# Patient Record
Sex: Male | Born: 1955
Health system: Southern US, Community
[De-identification: ages and names within clinical notes are randomized; demographics above are authoritative.]

## PROBLEM LIST (undated history)

## (undated) DIAGNOSIS — H269 Unspecified cataract: Secondary | ICD-10-CM

## (undated) DIAGNOSIS — E1159 Type 2 diabetes mellitus with other circulatory complications: Secondary | ICD-10-CM

## (undated) DIAGNOSIS — R200 Anesthesia of skin: Secondary | ICD-10-CM

## (undated) DIAGNOSIS — G4733 Obstructive sleep apnea (adult) (pediatric): Secondary | ICD-10-CM

## (undated) DIAGNOSIS — F419 Anxiety disorder, unspecified: Secondary | ICD-10-CM

## (undated) DIAGNOSIS — M1990 Unspecified osteoarthritis, unspecified site: Secondary | ICD-10-CM

## (undated) DIAGNOSIS — K4090 Unilateral inguinal hernia, without obstruction or gangrene, not specified as recurrent: Secondary | ICD-10-CM

## (undated) DIAGNOSIS — R7989 Other specified abnormal findings of blood chemistry: Secondary | ICD-10-CM

## (undated) DIAGNOSIS — M51369 Other intervertebral disc degeneration, lumbar region without mention of lumbar back pain or lower extremity pain: Secondary | ICD-10-CM

## (undated) DIAGNOSIS — D170 Benign lipomatous neoplasm of skin and subcutaneous tissue of head, face and neck: Secondary | ICD-10-CM

## (undated) DIAGNOSIS — M48061 Spinal stenosis, lumbar region without neurogenic claudication: Secondary | ICD-10-CM

## (undated) DIAGNOSIS — R740 Nonspecific elevation of levels of transaminase and lactic acid dehydrogenase [LDH]: Secondary | ICD-10-CM

## (undated) DIAGNOSIS — M545 Low back pain, unspecified: Secondary | ICD-10-CM

## (undated) DIAGNOSIS — E1165 Type 2 diabetes mellitus with hyperglycemia: Secondary | ICD-10-CM

## (undated) DIAGNOSIS — G4700 Insomnia, unspecified: Secondary | ICD-10-CM

## (undated) DIAGNOSIS — M5416 Radiculopathy, lumbar region: Secondary | ICD-10-CM

## (undated) DIAGNOSIS — S322XXA Fracture of coccyx, initial encounter for closed fracture: Secondary | ICD-10-CM

## (undated) DIAGNOSIS — G8929 Other chronic pain: Secondary | ICD-10-CM

## (undated) DIAGNOSIS — M5136 Other intervertebral disc degeneration, lumbar region: Secondary | ICD-10-CM

## (undated) DIAGNOSIS — E663 Overweight: Secondary | ICD-10-CM

## (undated) DIAGNOSIS — R197 Diarrhea, unspecified: Secondary | ICD-10-CM

## (undated) DIAGNOSIS — I4892 Unspecified atrial flutter: Secondary | ICD-10-CM

## (undated) DIAGNOSIS — Z860100 Personal history of colon polyps, unspecified: Secondary | ICD-10-CM

## (undated) DIAGNOSIS — K5730 Diverticulosis of large intestine without perforation or abscess without bleeding: Secondary | ICD-10-CM

## (undated) DIAGNOSIS — I10 Essential (primary) hypertension: Secondary | ICD-10-CM

## (undated) DIAGNOSIS — E785 Hyperlipidemia, unspecified: Secondary | ICD-10-CM

## (undated) DIAGNOSIS — N390 Urinary tract infection, site not specified: Secondary | ICD-10-CM

## (undated) DIAGNOSIS — C61 Malignant neoplasm of prostate: Secondary | ICD-10-CM

## (undated) DIAGNOSIS — M75100 Unspecified rotator cuff tear or rupture of unspecified shoulder, not specified as traumatic: Secondary | ICD-10-CM

## (undated) DIAGNOSIS — I4891 Unspecified atrial fibrillation: Secondary | ICD-10-CM

## (undated) DIAGNOSIS — K219 Gastro-esophageal reflux disease without esophagitis: Secondary | ICD-10-CM

## (undated) DIAGNOSIS — I441 Atrioventricular block, second degree: Secondary | ICD-10-CM

## (undated) DIAGNOSIS — M4800 Spinal stenosis, site unspecified: Secondary | ICD-10-CM

## (undated) DIAGNOSIS — Z8249 Family history of ischemic heart disease and other diseases of the circulatory system: Secondary | ICD-10-CM

## (undated) DIAGNOSIS — Z8719 Personal history of other diseases of the digestive system: Secondary | ICD-10-CM

## (undated) DIAGNOSIS — I493 Ventricular premature depolarization: Secondary | ICD-10-CM

## (undated) DIAGNOSIS — I491 Atrial premature depolarization: Secondary | ICD-10-CM

## (undated) DIAGNOSIS — K760 Fatty (change of) liver, not elsewhere classified: Secondary | ICD-10-CM

## (undated) DIAGNOSIS — R000 Tachycardia, unspecified: Secondary | ICD-10-CM

## (undated) DIAGNOSIS — J069 Acute upper respiratory infection, unspecified: Secondary | ICD-10-CM

## (undated) DIAGNOSIS — G609 Hereditary and idiopathic neuropathy, unspecified: Secondary | ICD-10-CM

## (undated) DIAGNOSIS — M722 Plantar fascial fibromatosis: Secondary | ICD-10-CM

## (undated) DIAGNOSIS — M5126 Other intervertebral disc displacement, lumbar region: Secondary | ICD-10-CM

## (undated) HISTORY — DX: Ventricular premature depolarization: I49.3

## (undated) HISTORY — DX: Unspecified atrial fibrillation: I48.91

## (undated) HISTORY — DX: Essential (primary) hypertension: I10

## (undated) HISTORY — DX: Type 2 diabetes mellitus with hyperglycemia: E11.65

## (undated) HISTORY — DX: Insomnia, unspecified: G47.00

## (undated) HISTORY — DX: Nonspecific elevation of levels of transaminase and lactic acid dehydrogenase (ldh): R74.0

## (undated) HISTORY — DX: Urinary tract infection, site not specified: N39.0

## (undated) HISTORY — DX: Acute upper respiratory infection, unspecified: J06.9

## (undated) HISTORY — DX: Low back pain: M54.5

## (undated) HISTORY — DX: Tachycardia, unspecified: R00.0

## (undated) HISTORY — PX: OTHER SURGICAL HISTORY: SHX169

## (undated) HISTORY — DX: Low back pain, unspecified: M54.50

## (undated) HISTORY — DX: Other specified abnormal findings of blood chemistry: R79.89

## (undated) HISTORY — DX: Malignant neoplasm of prostate: C61

## (undated) HISTORY — DX: Hyperlipidemia, unspecified: E78.5

## (undated) HISTORY — DX: Overweight: E66.3

## (undated) HISTORY — DX: Plantar fascial fibromatosis: M72.2

## (undated) HISTORY — DX: Family history of ischemic heart disease and other diseases of the circulatory system: Z82.49

## (undated) HISTORY — DX: Atrioventricular block, second degree: I44.1

## (undated) HISTORY — DX: Anxiety disorder, unspecified: F41.9

## (undated) HISTORY — DX: Hereditary and idiopathic neuropathy, unspecified: G60.9

## (undated) HISTORY — DX: Type 2 diabetes mellitus with other circulatory complications: E11.59

## (undated) HISTORY — DX: Unspecified atrial flutter: I48.92

## (undated) HISTORY — PX: NASAL SINUS SURGERY: SHX719

## (undated) HISTORY — DX: Other chronic pain: G89.29

## (undated) HISTORY — DX: Atrial premature depolarization: I49.1

## (undated) HISTORY — PX: PROSTATE BIOPSY: SHX241

## (undated) HISTORY — DX: Obstructive sleep apnea (adult) (pediatric): G47.33

## (undated) HISTORY — PX: ABLATION: SHX5711

---
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
## 2000-01-29 ENCOUNTER — Other Ambulatory Visit: Admission: RE | Admit: 2000-01-29 | Discharge: 2000-01-29 | Payer: Self-pay | Admitting: Oral Surgery

## 2003-02-27 ENCOUNTER — Encounter: Payer: Self-pay | Admitting: Internal Medicine

## 2003-03-06 ENCOUNTER — Encounter: Payer: Self-pay | Admitting: Internal Medicine

## 2003-07-20 DIAGNOSIS — D170 Benign lipomatous neoplasm of skin and subcutaneous tissue of head, face and neck: Secondary | ICD-10-CM

## 2003-07-20 HISTORY — DX: Benign lipomatous neoplasm of skin and subcutaneous tissue of head, face and neck: D17.0

## 2003-08-17 ENCOUNTER — Encounter: Admission: RE | Admit: 2003-08-17 | Discharge: 2003-08-17 | Payer: Self-pay | Admitting: Internal Medicine

## 2004-04-15 ENCOUNTER — Ambulatory Visit: Payer: Self-pay | Admitting: Internal Medicine

## 2004-04-22 ENCOUNTER — Ambulatory Visit: Payer: Self-pay | Admitting: Internal Medicine

## 2004-05-06 ENCOUNTER — Ambulatory Visit: Payer: Self-pay | Admitting: Internal Medicine

## 2004-06-05 ENCOUNTER — Ambulatory Visit: Payer: Self-pay | Admitting: Internal Medicine

## 2004-09-05 ENCOUNTER — Ambulatory Visit: Payer: Self-pay | Admitting: Internal Medicine

## 2004-11-22 ENCOUNTER — Ambulatory Visit: Payer: Self-pay | Admitting: Internal Medicine

## 2005-02-02 ENCOUNTER — Ambulatory Visit: Payer: Self-pay | Admitting: Internal Medicine

## 2005-04-30 ENCOUNTER — Ambulatory Visit: Payer: Self-pay | Admitting: Internal Medicine

## 2005-11-19 ENCOUNTER — Ambulatory Visit: Payer: Self-pay | Admitting: Internal Medicine

## 2005-11-19 LAB — CONVERTED CEMR LAB
ALT: 57 units/L — ABNORMAL HIGH (ref 0–40)
AST: 33 units/L (ref 0–37)
Albumin: 4.3 g/dL (ref 3.5–5.2)
Alkaline Phosphatase: 61 units/L (ref 39–117)
BUN: 14 mg/dL (ref 6–23)
Basophils Absolute: 0 10*3/uL (ref 0.0–0.1)
Basophils Relative: 0.9 % (ref 0.0–1.0)
CO2: 30 meq/L (ref 19–32)
Calcium: 9.3 mg/dL (ref 8.4–10.5)
Chloride: 104 meq/L (ref 96–112)
Chol/HDL Ratio, serum: 4.5
Cholesterol: 138 mg/dL (ref 0–200)
Creatinine, Ser: 1.1 mg/dL (ref 0.4–1.5)
Eosinophil percent: 1.9 % (ref 0.0–5.0)
GFR calc non Af Amer: 76 mL/min
Glomerular Filtration Rate, Af Am: 92 mL/min/{1.73_m2}
Glucose, Bld: 123 mg/dL — ABNORMAL HIGH (ref 70–99)
HCT: 45.5 % (ref 39.0–52.0)
HDL: 31 mg/dL — ABNORMAL LOW (ref >39.0)
Hemoglobin: 15.2 g/dL (ref 13.0–17.0)
Hgb A1c MFr Bld: 5.7 % (ref 4.6–6.0)
LDL Cholesterol: 89 mg/dL (ref 0–99)
Lymphocytes Relative: 31.6 % (ref 12.0–46.0)
MCHC: 33.5 g/dL (ref 30.0–36.0)
MCV: 94.4 fL (ref 78.0–100.0)
Monocytes Absolute: 0.7 10*3/uL (ref 0.2–0.7)
Monocytes Relative: 13.5 % — ABNORMAL HIGH (ref 3.0–11.0)
Neutro Abs: 2.9 10*3/uL (ref 1.4–7.7)
Neutrophils Relative %: 52.1 % (ref 43.0–77.0)
Platelets: 174 10*3/uL (ref 150–400)
Potassium: 4.4 meq/L (ref 3.5–5.1)
RBC: 4.82 M/uL (ref 4.22–5.81)
RDW: 11.8 % (ref 11.5–14.6)
Sodium: 141 meq/L (ref 135–145)
TSH: 1.59 microintl units/mL (ref 0.35–5.50)
Total Bilirubin: 0.6 mg/dL (ref 0.3–1.2)
Total Protein: 6.7 g/dL (ref 6.0–8.3)
Triglyceride fasting, serum: 88 mg/dL (ref 0–149)
VLDL: 18 mg/dL (ref 0–40)
WBC: 5.4 10*3/uL (ref 4.5–10.5)

## 2005-11-25 ENCOUNTER — Ambulatory Visit: Payer: Self-pay | Admitting: Internal Medicine

## 2005-11-29 DIAGNOSIS — E785 Hyperlipidemia, unspecified: Secondary | ICD-10-CM | POA: Insufficient documentation

## 2005-11-30 ENCOUNTER — Ambulatory Visit: Payer: Self-pay | Admitting: Internal Medicine

## 2005-11-30 DIAGNOSIS — R740 Nonspecific elevation of levels of transaminase and lactic acid dehydrogenase [LDH]: Secondary | ICD-10-CM

## 2005-11-30 DIAGNOSIS — R7401 Elevation of levels of liver transaminase levels: Secondary | ICD-10-CM

## 2005-11-30 HISTORY — DX: Elevation of levels of liver transaminase levels: R74.01

## 2006-05-24 ENCOUNTER — Ambulatory Visit: Payer: Self-pay | Admitting: Internal Medicine

## 2006-05-24 LAB — CONVERTED CEMR LAB
ALT: 59 units/L — ABNORMAL HIGH (ref 0–40)
AST: 34 units/L (ref 0–37)
Albumin: 4 g/dL (ref 3.5–5.2)
Alkaline Phosphatase: 51 units/L (ref 39–117)
BUN: 13 mg/dL (ref 6–23)
Bilirubin, Direct: 0.1 mg/dL (ref 0.0–0.3)
CO2: 28 meq/L (ref 19–32)
Calcium: 9.2 mg/dL (ref 8.4–10.5)
Chloride: 108 meq/L (ref 96–112)
Cholesterol: 166 mg/dL (ref 0–200)
Creatinine, Ser: 1 mg/dL (ref 0.4–1.5)
GFR calc Af Amer: 102 mL/min
GFR calc non Af Amer: 84 mL/min
Glucose, Bld: 119 mg/dL — ABNORMAL HIGH (ref 70–99)
HDL: 36.1 mg/dL — ABNORMAL LOW (ref >39.0)
Hgb A1c MFr Bld: 6 % (ref 4.6–6.0)
LDL Cholesterol: 108 mg/dL — ABNORMAL HIGH (ref 0–99)
Potassium: 4 meq/L (ref 3.5–5.1)
Sodium: 142 meq/L (ref 135–145)
Total Bilirubin: 0.7 mg/dL (ref 0.3–1.2)
Total CHOL/HDL Ratio: 4.6
Total Protein: 6.2 g/dL (ref 6.0–8.3)
Triglycerides: 111 mg/dL (ref 0–149)
VLDL: 22 mg/dL (ref 0–40)

## 2006-05-31 ENCOUNTER — Ambulatory Visit: Payer: Self-pay | Admitting: Internal Medicine

## 2006-06-01 LAB — CONVERTED CEMR LAB
ALT: 63 units/L — ABNORMAL HIGH (ref 0–40)
AST: 32 units/L (ref 0–37)
Albumin: 4.4 g/dL (ref 3.5–5.2)
Alkaline Phosphatase: 49 units/L (ref 39–117)
BUN: 10 mg/dL (ref 6–23)
Basophils Absolute: 0 10*3/uL (ref 0.0–0.1)
Basophils Relative: 0.7 % (ref 0.0–1.0)
Bilirubin, Direct: 0.1 mg/dL (ref 0.0–0.3)
CO2: 25 meq/L (ref 19–32)
Calcium: 9 mg/dL (ref 8.4–10.5)
Chloride: 107 meq/L (ref 96–112)
Cholesterol: 153 mg/dL (ref 0–200)
Creatinine, Ser: 0.9 mg/dL (ref 0.4–1.5)
Eosinophils Absolute: 0.1 10*3/uL (ref 0.0–0.6)
Eosinophils Relative: 1.4 % (ref 0.0–5.0)
GFR calc Af Amer: 115 mL/min
GFR calc non Af Amer: 95 mL/min
Glucose, Bld: 84 mg/dL (ref 70–99)
HCT: 43.4 % (ref 39.0–52.0)
HDL: 29.3 mg/dL — ABNORMAL LOW (ref >39.0)
Hemoglobin: 15.2 g/dL (ref 13.0–17.0)
LDL Cholesterol: 93 mg/dL (ref 0–99)
Lymphocytes Relative: 32.5 % (ref 12.0–46.0)
MCHC: 35 g/dL (ref 30.0–36.0)
MCV: 92.5 fL (ref 78.0–100.0)
Monocytes Absolute: 0.8 10*3/uL — ABNORMAL HIGH (ref 0.2–0.7)
Monocytes Relative: 14.6 % — ABNORMAL HIGH (ref 3.0–11.0)
Neutro Abs: 2.9 10*3/uL (ref 1.4–7.7)
Neutrophils Relative %: 50.8 % (ref 43.0–77.0)
PSA: 2.41 ng/mL (ref 0.10–4.00)
Platelets: 152 10*3/uL (ref 150–400)
Potassium: 4 meq/L (ref 3.5–5.1)
RBC: 4.7 M/uL (ref 4.22–5.81)
RDW: 11.9 % (ref 11.5–14.6)
Sodium: 139 meq/L (ref 135–145)
TSH: 1.95 microintl units/mL (ref 0.35–5.50)
Total Bilirubin: 0.7 mg/dL (ref 0.3–1.2)
Total CHOL/HDL Ratio: 5.2
Total Protein: 6.4 g/dL (ref 6.0–8.3)
Triglycerides: 153 mg/dL — ABNORMAL HIGH (ref 0–149)
VLDL: 31 mg/dL (ref 0–40)
WBC: 5.7 10*3/uL (ref 4.5–10.5)

## 2006-09-30 ENCOUNTER — Telehealth: Payer: Self-pay | Admitting: *Deleted

## 2006-11-29 ENCOUNTER — Ambulatory Visit: Payer: Self-pay | Admitting: Internal Medicine

## 2006-11-29 LAB — CONVERTED CEMR LAB
ALT: 43 units/L (ref 0–53)
AST: 27 units/L (ref 0–37)
Albumin: 4.2 g/dL (ref 3.5–5.2)
Alkaline Phosphatase: 60 units/L (ref 39–117)
BUN: 10 mg/dL (ref 6–23)
Basophils Absolute: 0 10*3/uL (ref 0.0–0.1)
Basophils Relative: 0.7 % (ref 0.0–1.0)
Bilirubin Urine: NEGATIVE
Bilirubin, Direct: 0.1 mg/dL (ref 0.0–0.3)
CO2: 31 meq/L (ref 19–32)
Calcium: 9.4 mg/dL (ref 8.4–10.5)
Chloride: 107 meq/L (ref 96–112)
Cholesterol: 172 mg/dL (ref 0–200)
Creatinine, Ser: 0.9 mg/dL (ref 0.4–1.5)
Eosinophils Absolute: 0.1 10*3/uL (ref 0.0–0.6)
Eosinophils Relative: 2.3 % (ref 0.0–5.0)
GFR calc Af Amer: 115 mL/min
GFR calc non Af Amer: 95 mL/min
Glucose, Bld: 118 mg/dL — ABNORMAL HIGH (ref 70–99)
Glucose, Urine, Semiquant: 100
HCT: 45.3 % (ref 39.0–52.0)
HDL: 33.7 mg/dL — ABNORMAL LOW (ref >39.0)
Hemoglobin: 15.7 g/dL (ref 13.0–17.0)
Ketones, urine, test strip: NEGATIVE
LDL Cholesterol: 112 mg/dL — ABNORMAL HIGH (ref 0–99)
Lymphocytes Relative: 29.6 % (ref 12.0–46.0)
MCHC: 34.6 g/dL (ref 30.0–36.0)
MCV: 93.9 fL (ref 78.0–100.0)
Monocytes Absolute: 0.7 10*3/uL (ref 0.2–0.7)
Monocytes Relative: 12.4 % — ABNORMAL HIGH (ref 3.0–11.0)
Neutro Abs: 3 10*3/uL (ref 1.4–7.7)
Neutrophils Relative %: 55 % (ref 43.0–77.0)
Nitrite: NEGATIVE
PSA: 1.81 ng/mL (ref 0.10–4.00)
Platelets: 158 10*3/uL (ref 150–400)
Potassium: 4.8 meq/L (ref 3.5–5.1)
Protein, U semiquant: NEGATIVE
RBC: 4.82 M/uL (ref 4.22–5.81)
RDW: 12.2 % (ref 11.5–14.6)
Sodium: 143 meq/L (ref 135–145)
Specific Gravity, Urine: <1.005
TSH: 1.43 microintl units/mL (ref 0.35–5.50)
Total Bilirubin: 0.7 mg/dL (ref 0.3–1.2)
Total CHOL/HDL Ratio: 5.1
Total Protein: 6.2 g/dL (ref 6.0–8.3)
Triglycerides: 134 mg/dL (ref 0–149)
Urobilinogen, UA: 0.2
VLDL: 27 mg/dL (ref 0–40)
WBC Urine, dipstick: NEGATIVE
WBC: 5.4 10*3/uL (ref 4.5–10.5)
pH: 5

## 2006-12-06 ENCOUNTER — Ambulatory Visit: Payer: Self-pay | Admitting: Internal Medicine

## 2006-12-06 DIAGNOSIS — G4700 Insomnia, unspecified: Secondary | ICD-10-CM

## 2006-12-06 DIAGNOSIS — G609 Hereditary and idiopathic neuropathy, unspecified: Secondary | ICD-10-CM

## 2006-12-06 HISTORY — DX: Insomnia, unspecified: G47.00

## 2006-12-06 HISTORY — DX: Hereditary and idiopathic neuropathy, unspecified: G60.9

## 2006-12-07 LAB — CONVERTED CEMR LAB
CRP, High Sensitivity: 1 (ref 0.00–5.00)
Testosterone: 502.32 ng/dL (ref 350.00–890)

## 2007-01-03 ENCOUNTER — Ambulatory Visit: Payer: Self-pay | Admitting: Internal Medicine

## 2007-01-05 ENCOUNTER — Ambulatory Visit: Payer: Self-pay | Admitting: Internal Medicine

## 2007-09-05 ENCOUNTER — Ambulatory Visit: Payer: Self-pay | Admitting: Internal Medicine

## 2007-09-07 ENCOUNTER — Telehealth (INDEPENDENT_AMBULATORY_CARE_PROVIDER_SITE_OTHER): Payer: Self-pay | Admitting: *Deleted

## 2007-09-21 ENCOUNTER — Ambulatory Visit: Payer: Self-pay | Admitting: Gastroenterology

## 2007-10-04 ENCOUNTER — Ambulatory Visit: Payer: Self-pay | Admitting: Internal Medicine

## 2007-10-04 ENCOUNTER — Encounter: Payer: Self-pay | Admitting: Internal Medicine

## 2007-10-04 LAB — HM COLONOSCOPY

## 2007-10-06 ENCOUNTER — Encounter: Payer: Self-pay | Admitting: Internal Medicine

## 2007-10-06 ENCOUNTER — Ambulatory Visit: Payer: Self-pay | Admitting: Internal Medicine

## 2007-10-06 LAB — CONVERTED CEMR LAB
ALT: 60 units/L — ABNORMAL HIGH (ref 0–53)
Albumin: 4.5 g/dL (ref 3.5–5.2)
Basophils Relative: 0.6 % (ref 0.0–3.0)
Bilirubin Urine: NEGATIVE
Bilirubin, Direct: 0.2 mg/dL (ref 0.0–0.3)
Cholesterol: 175 mg/dL (ref 0–200)
Creatinine, Ser: 1 mg/dL (ref 0.4–1.5)
Glucose, Bld: 114 mg/dL — ABNORMAL HIGH (ref 70–99)
Hemoglobin: 15.7 g/dL (ref 13.0–17.0)
LDL Cholesterol: 123 mg/dL — ABNORMAL HIGH (ref 0–99)
Lymphocytes Relative: 26.7 % (ref 12.0–46.0)
Monocytes Absolute: 0.6 10*3/uL (ref 0.1–1.0)
Monocytes Relative: 11.4 % (ref 3.0–12.0)
Neutro Abs: 3 10*3/uL (ref 1.4–7.7)
PSA: 3.45 ng/mL (ref 0.10–4.00)
Potassium: 4 meq/L (ref 3.5–5.1)
RBC: 4.76 M/uL (ref 4.22–5.81)
RDW: 11.9 % (ref 11.5–14.6)
Specific Gravity, Urine: 1.015
Total CHOL/HDL Ratio: 5.3
Triglycerides: 96 mg/dL (ref 0–149)
VLDL: 19 mg/dL (ref 0–40)
WBC: 5.1 10*3/uL (ref 4.5–10.5)

## 2007-10-17 ENCOUNTER — Ambulatory Visit: Payer: Self-pay | Admitting: Internal Medicine

## 2007-12-02 ENCOUNTER — Encounter: Payer: Self-pay | Admitting: Internal Medicine

## 2008-01-09 ENCOUNTER — Telehealth: Payer: Self-pay | Admitting: Internal Medicine

## 2008-01-24 ENCOUNTER — Telehealth: Payer: Self-pay | Admitting: Internal Medicine

## 2008-03-12 ENCOUNTER — Encounter: Payer: Self-pay | Admitting: Internal Medicine

## 2008-03-12 ENCOUNTER — Ambulatory Visit: Payer: Self-pay

## 2008-04-24 ENCOUNTER — Telehealth: Payer: Self-pay | Admitting: Internal Medicine

## 2008-05-23 ENCOUNTER — Ambulatory Visit: Payer: Self-pay | Admitting: Internal Medicine

## 2008-05-30 ENCOUNTER — Telehealth: Payer: Self-pay | Admitting: Internal Medicine

## 2008-06-08 ENCOUNTER — Telehealth: Payer: Self-pay | Admitting: Internal Medicine

## 2008-06-21 ENCOUNTER — Encounter: Admission: RE | Admit: 2008-06-21 | Discharge: 2008-06-29 | Payer: Self-pay | Admitting: Internal Medicine

## 2008-07-08 ENCOUNTER — Encounter: Payer: Self-pay | Admitting: Internal Medicine

## 2008-10-16 ENCOUNTER — Ambulatory Visit: Payer: Self-pay | Admitting: Internal Medicine

## 2008-10-23 ENCOUNTER — Ambulatory Visit: Payer: Self-pay | Admitting: Cardiology

## 2008-10-23 DIAGNOSIS — E663 Overweight: Secondary | ICD-10-CM

## 2008-10-23 HISTORY — DX: Overweight: E66.3

## 2008-10-25 ENCOUNTER — Telehealth: Payer: Self-pay | Admitting: Internal Medicine

## 2008-10-29 ENCOUNTER — Ambulatory Visit: Payer: Self-pay | Admitting: Internal Medicine

## 2008-10-29 LAB — CONVERTED CEMR LAB
ALT: 65 units/L — ABNORMAL HIGH (ref 0–53)
Albumin: 4.5 g/dL (ref 3.5–5.2)
Basophils Absolute: 0 10*3/uL (ref 0.0–0.1)
CO2: 30 meq/L (ref 19–32)
Calcium: 9.3 mg/dL (ref 8.4–10.5)
Chloride: 106 meq/L (ref 96–112)
Cholesterol: 213 mg/dL — ABNORMAL HIGH (ref 0–200)
Creatinine, Ser: 1.1 mg/dL (ref 0.4–1.5)
HDL: 34.2 mg/dL — ABNORMAL LOW (ref >39.00)
Leukocytes, UA: NEGATIVE
Lymphocytes Relative: 30.5 % (ref 12.0–46.0)
MCV: 97 fL (ref 78.0–100.0)
Monocytes Relative: 10.5 % (ref 3.0–12.0)
Neutrophils Relative %: 55.6 % (ref 43.0–77.0)
Nitrite: NEGATIVE
Platelets: 149 10*3/uL — ABNORMAL LOW (ref 150.0–400.0)
Potassium: 4.9 meq/L (ref 3.5–5.1)
RBC: 4.8 M/uL (ref 4.22–5.81)
RDW: 12 % (ref 11.5–14.6)
TSH: 1.26 microintl units/mL (ref 0.35–5.50)
Total Bilirubin: 0.9 mg/dL (ref 0.3–1.2)
Total Protein, Urine: NEGATIVE mg/dL
Total Protein: 6.6 g/dL (ref 6.0–8.3)
Triglycerides: 157 mg/dL — ABNORMAL HIGH (ref 0.0–149.0)
WBC: 5.7 10*3/uL (ref 4.5–10.5)
pH: 6 (ref 5.0–8.0)

## 2008-11-05 ENCOUNTER — Ambulatory Visit: Payer: Self-pay | Admitting: Internal Medicine

## 2008-11-22 ENCOUNTER — Ambulatory Visit: Payer: Self-pay | Admitting: Internal Medicine

## 2008-11-22 LAB — CONVERTED CEMR LAB
Cholesterol, target level: 200 mg/dL
HDL goal, serum: 40 mg/dL
LDL Goal: 130 mg/dL

## 2008-12-15 ENCOUNTER — Telehealth: Payer: Self-pay | Admitting: Family Medicine

## 2008-12-17 ENCOUNTER — Ambulatory Visit: Payer: Self-pay | Admitting: Internal Medicine

## 2009-01-04 ENCOUNTER — Telehealth: Payer: Self-pay | Admitting: Internal Medicine

## 2009-03-19 ENCOUNTER — Telehealth: Payer: Self-pay | Admitting: Internal Medicine

## 2009-03-25 ENCOUNTER — Ambulatory Visit: Payer: Self-pay | Admitting: Internal Medicine

## 2009-04-08 ENCOUNTER — Telehealth: Payer: Self-pay | Admitting: Internal Medicine

## 2009-04-12 ENCOUNTER — Encounter: Payer: Self-pay | Admitting: Internal Medicine

## 2009-05-13 ENCOUNTER — Ambulatory Visit: Payer: Self-pay | Admitting: Internal Medicine

## 2009-09-09 ENCOUNTER — Ambulatory Visit: Payer: Self-pay | Admitting: Internal Medicine

## 2009-09-09 LAB — CONVERTED CEMR LAB
ALT: 74 units/L — ABNORMAL HIGH (ref 0–53)
Albumin: 4.5 g/dL (ref 3.5–5.2)
BUN: 16 mg/dL (ref 6–23)
Bilirubin, Direct: 0.1 mg/dL (ref 0.0–0.3)
CO2: 28 meq/L (ref 19–32)
Calcium: 9.6 mg/dL (ref 8.4–10.5)
Cholesterol: 160 mg/dL (ref 0–200)
Creatinine, Ser: 0.9 mg/dL (ref 0.4–1.5)
HDL: 32.6 mg/dL — ABNORMAL LOW (ref >39.00)
Hgb A1c MFr Bld: 6.4 % (ref 4.6–6.5)
Total CHOL/HDL Ratio: 5
Triglycerides: 103 mg/dL (ref 0.0–149.0)

## 2009-09-16 ENCOUNTER — Ambulatory Visit: Payer: Self-pay | Admitting: Internal Medicine

## 2009-09-16 DIAGNOSIS — R002 Palpitations: Secondary | ICD-10-CM | POA: Insufficient documentation

## 2009-11-21 ENCOUNTER — Ambulatory Visit: Payer: Self-pay | Admitting: Internal Medicine

## 2009-11-21 DIAGNOSIS — H1045 Other chronic allergic conjunctivitis: Secondary | ICD-10-CM | POA: Insufficient documentation

## 2009-11-21 DIAGNOSIS — L74519 Primary focal hyperhidrosis, unspecified: Secondary | ICD-10-CM | POA: Insufficient documentation

## 2009-12-11 ENCOUNTER — Ambulatory Visit: Payer: Self-pay | Admitting: Internal Medicine

## 2009-12-18 ENCOUNTER — Telehealth: Payer: Self-pay | Admitting: Internal Medicine

## 2010-01-19 HISTORY — PX: OTHER SURGICAL HISTORY: SHX169

## 2010-02-18 NOTE — Assessment & Plan Note (Signed)
Summary: sinus inf?/cdw   Vital Signs:  Patient profile:   55 year old male Weight:      225 pounds Temp:     98.2 degrees F oral Pulse rate:   86 / minute Pulse rhythm:   regular Resp:     12 per minute BP sitting:   138 / 78  (left arm) Cuff size:   regular  Vitals Entered By: Gladis Riffle, RN (March 25, 2009 1:34 PM) CC: c/o sinus and chest congestion x 2 weeks, went scuba diving and now has ear clogging and bloody nasal discharge--discuss zolpidem Is Patient Diabetic? No   Primary Care Provider:  Birdie Sons MD  CC:  c/o sinus and chest congestion x 2 weeks and went scuba diving and now has ear clogging and bloody nasal discharge--discuss zolpidem.  History of Present Illness: URI sxs for 2 weeks sinus congestion and ear pressure no fever or chills rhinorrhea now bloody  In addition he is concerned about a spot ---left upper quadrant of abdomen  Insomnia---he admits he is dependant on ambien---uses every night (sleeps 6 hours)  Preventive Screening-Counseling & Management  Alcohol-Tobacco     Alcohol drinks/day: 1     Alcohol type: beer     Smoking Status: quit > 6 months     Year Started: 1977     Year Quit: 1982  Current Medications (verified): 1)  Zolpidem Tartrate 5 Mg  Tabs (Zolpidem Tartrate) .[name].. 1 By Mouth At Night As Needed 2)  Fluticasone Propionate 50 Mcg/act Susp (Fluticasone Propionate) .... Spray 1 Puff Into Both Nostrils Once A Day As Needed 3)  Vytorin 10-40 Mg Tabs (Ezetimibe-Simvastatin) .... Take 1 Tablet By Mouth Once A Day 4)  Multivitamins   Tabs (Multiple Vitamin) .... Once Daily 5)  Glucosamine-Chondroitin 500-400 Mg  Tabs (Glucosamine-Chondroitin) .... Once Daily 6)  Omega-3 350 Mg  Caps (Omega-3 Fatty Acids) .... Once Daily 7)  Metamucil 30.9 %  Powd (Psyllium) .... Once Daily 8)  Saw Palmetto Berry 160 Mg  Caps (Saw Palmetto (Serenoa Repens)) .... Once Daily 9)  Claritin 10 Mg Tabs (Loratadine) .... Once Daily As Needed 10)   Hydroxyzine Hcl 10 Mg Tabs (Hydroxyzine Hcl) .[name].. 1-2 Hs As Needed 11)  Desonide 0.05 % Crea (Desonide) .... Use Two Times A Day As Directed  Allergies (verified): No Known Drug Allergies  Physical Exam  General:  alert and well-developed.   Head:  normocephalic and atraumatic.   Eyes:  pupils equal and pupils round.   Ears:  bilateral retracted TMs Neck:  No deformities, masses, or tenderness noted. Lungs:  normal respiratory effort and no accessory muscle use.   Heart:  normal rate and regular rhythm.   Abdomen:  Bowel sounds positive,abdomen soft and non-tender without masses, organomegaly or hernias noted. Msk:  No deformity or scoliosis noted of thoracic or lumbar spine.   Neurologic:  cranial nerves II-XII intact and gait normal.     Impression & Recommendations:  Problem # 1:  URI (ICD-465.9) given hx trial abx--- side effects discussed His updated medication list for this problem includes:    Claritin 10 Mg Tabs (Loratadine) ..... Once daily as needed  Problem # 2:  INSOMNIA (ICD-780.52) discussed at length i voiced concern about any use of medications he understands likelihood of dependance on meds side effects ciscussed His updated medication list for this problem includes:    Zolpidem Tartrate 5 Mg Tabs (Zolpidem tartrate) .[name]... 1 by mouth at night as needed round  area ofmild erythema RUB abdomen ? eczema---no recurrence  Complete Medication List: 1)  Zolpidem Tartrate 5 Mg Tabs (Zolpidem tartrate) .[name].. 1 by mouth at night as needed 2)  Fluticasone Propionate 50 Mcg/act Susp (Fluticasone propionate) .... Spray 1 puff into both nostrils once a day as needed 3)  Vytorin 10-40 Mg Tabs (Ezetimibe-simvastatin) .... Take 1 tablet by mouth once a day 4)  Multivitamins Tabs (Multiple vitamin) .... Once daily 5)  Glucosamine-chondroitin 500-400 Mg Tabs (Glucosamine-chondroitin) .... Once daily 6)  Omega-3 350 Mg Caps (Omega-3 fatty acids) .... Once daily 7)  Metamucil  30.9 % Powd (Psyllium) .... Once daily 8)  Saw Palmetto Berry 160 Mg Caps (Saw palmetto (serenoa repens)) .... Once daily 9)  Claritin 10 Mg Tabs (Loratadine) .... Once daily as needed 10)  Hydroxyzine Hcl 10 Mg Tabs (Hydroxyzine hcl) .[name].. 1-2 hs as needed 11)  Desonide 0.05 % Crea (Desonide) .... Use two times a day as directed 12)  Doxycycline Hyclate 100 Mg Caps (Doxycycline hyclate) .... Take 1 tab twice a day Prescriptions: DOXYCYCLINE HYCLATE 100 MG CAPS (DOXYCYCLINE HYCLATE) Take 1 tab twice a day  #20 x 0   Entered and Authorized by:   Birdie Sons MD   Signed by:   Birdie Sons MD on 03/25/2009   Method used:   Print then Give to Patient   RxID:   [id number] ZOLPIDEM TARTRATE 5 MG  TABS (ZOLPIDEM TARTRATE) 1 by mouth at night as needed  #30 x 3   Entered and Authorized by:   Birdie Sons MD   Signed by:   Birdie Sons MD on 03/25/2009   Method used:   Print then Give to Patient   RxID:   [id number]

## 2010-02-18 NOTE — Progress Notes (Signed)
Summary: groin rash  Phone Note Call from Patient   Caller: Patient Call For: Birdie Sons MD Summary of Call: [facility] [place] [phone] Nystatin is not helping groin rash that Dr. Kirtland Bouchard had given him.  Pt. states Dr. Cato Mulligan has been treating him for years for the same thing. Initial call taken by: [facility] CMA AAMA,  December 18, 2009 2:11 PM  Follow-up for Phone Call        per Dr Cato Mulligan if the rash is discolored call him in [place] cream use two times a day for 14 days.  If irritated oral lamisil 250mg  1 by mouth once daily for 10 days Follow-up by: Alfred Levins, CMA,  December 18, 2009 4:08 PM    New/Updated Medications: CLOBETASOL PROPIONATE 0.05 % CREA (CLOBETASOL PROPIONATE) Apply two times a day x 14 days Prescriptions: CLOBETASOL PROPIONATE 0.05 % CREA (CLOBETASOL PROPIONATE) Apply two times a day x 14 days  #1 tube x 0   Entered by:   Lynann Beaver CMA AAMA   Authorized by:   Birdie Sons MD   Signed by:   Lynann Beaver CMA AAMA on 12/18/2009   Method used:   Electronically to        [facility]  [phone]* (retail)       [address]       [place], [place]  [address]       Ph: [phone] or [phone]       Fax: [phone]   RxID:   [id number]  Notified pt.

## 2010-02-18 NOTE — Assessment & Plan Note (Signed)
Summary: ?eye inf/nj   Vital Signs:  Patient profile:   55 year old male Weight:      219 pounds Temp:     98.2 degrees F oral BP sitting:   100 / 64  (right arm) Cuff size:   regular  Vitals Entered By: Duard Brady LPN (November 21, 2009 4:37 PM) CC: c/o bilat. eye infection- drainage and film  Is Patient Diabetic? No   Primary Care Provider:  Birdie Sons MD  CC:  c/o bilat. eye infection- drainage and film .  History of Present Illness: 55 year old patient who has a history of dyslipidemia.  He also has a history of allergic rhinitis.  Complaints today include red eyes.  There is been no exudate or change in visual acuity.  He feels that the eyes are improving over the past day or two. he also complains of a rash in the inner gluteal and perineal area that has been a recurrent problem in the past.  He describes excessive sweating in the perineal area.  He is requesting a refill on nystatin  Preventive Screening-Counseling & Management  Alcohol-Tobacco     Smoking Status: quit  Problems Prior to Update: 1)  Palpitations  (ICD-785.1) 2)  Overweight  (ICD-278.02) 3)  Insomnia  (ICD-780.52) 4)  Peripheral Neuropathy  (ICD-356.9) 5)  Hyperglycemia  (ICD-790.29) 6)  Preventive Health Care  (ICD-V70.0) 7)  Transaminases, Serum, Elevated  (ICD-790.4) 8)  Hyperlipidemia  (ICD-272.4)  Medications Prior to Update: 1)  Zolpidem Tartrate 5 Mg  Tabs (Zolpidem Tartrate) .[name].. 1 By Mouth At Night As Needed--Each Rx Must Last 30 Days 2)  Fluticasone Propionate 50 Mcg/act Susp (Fluticasone Propionate) .... Spray 1 Puff Into Both Nostrils Once A Day As Needed 3)  Vytorin 10-40 Mg Tabs (Ezetimibe-Simvastatin) .... Take 1 Tablet By Mouth Once A Day 4)  Multivitamins   Tabs (Multiple Vitamin) .... Once Daily 5)  Glucosamine-Chondroitin 500-400 Mg  Tabs (Glucosamine-Chondroitin) .... Once Daily 6)  Omega-3 350 Mg  Caps (Omega-3 Fatty Acids) .... Once Daily 7)  Metamucil 30.9 %  Powd  (Psyllium) .... Once Daily 8)  Saw Palmetto Berry 160 Mg  Caps (Saw Palmetto (Serenoa Repens)) .... Once Daily 9)  Claritin 10 Mg Tabs (Loratadine) .... Once Daily As Needed 10)  Hydroxyzine Hcl 10 Mg Tabs (Hydroxyzine Hcl) .... Take 1 Tablet By Mouth Two Times A Day As Needed  Allergies (verified): No Known Drug Allergies  Directives: 1)  No Blood Transfusion   Past History:  Past Medical History: Reviewed history from 10/23/2008 and no changes required. Hyperlipidemia chronic low back pain Overweight  Social History: Smoking Status:  quit  Review of Systems       The patient complains of suspicious skin lesions.  The patient denies anorexia, fever, weight loss, weight gain, vision loss, decreased hearing, hoarseness, chest pain, syncope, dyspnea on exertion, peripheral edema, prolonged cough, headaches, hemoptysis, abdominal pain, melena, hematochezia, severe indigestion/heartburn, hematuria, incontinence, genital sores, muscle weakness, transient blindness, difficulty walking, depression, unusual weight change, abnormal bleeding, enlarged lymph nodes, angioedema, breast masses, and testicular masses.    Physical Exam  General:  overweight-appearing.   Eyes:  mild conjunctiva injection   Impression & Recommendations:  Problem # 1:  CONJUNCTIVITIS, ALLERGIC (ICD-372.14)  Problem # 2:  PRIMARY FOCAL HYPERHIDROSIS (ICD-705.21) will give him a trial of  Drysol- he is aware of the possible associated irritation  Complete Medication List: 1)  Zolpidem Tartrate 5 Mg Tabs (Zolpidem tartrate) .[name].. 1 by mouth  at night as needed--each rx must last 30 days 2)  Fluticasone Propionate 50 Mcg/act Susp (Fluticasone propionate) .... Spray 1 puff into both nostrils once a day as needed 3)  Vytorin 10-40 Mg Tabs (Ezetimibe-simvastatin) .... Take 1 tablet by mouth once a day 4)  Multivitamins Tabs (Multiple vitamin) .... Once daily 5)  Glucosamine-chondroitin 500-400 Mg Tabs  (Glucosamine-chondroitin) .... Once daily 6)  Omega-3 350 Mg Caps (Omega-3 fatty acids) .... Once daily 7)  Metamucil 30.9 % Powd (Psyllium) .... Once daily 8)  Saw Palmetto Berry 160 Mg Caps (Saw palmetto (serenoa repens)) .... Once daily 9)  Claritin 10 Mg Tabs (Loratadine) .... Once daily as needed 10)  Hydroxyzine Hcl 10 Mg Tabs (Hydroxyzine hcl) .... Take 1 tablet by mouth two times a day as needed 11)  Nystatin-triamcinolone 100000-0.1 Unit/gm-% Crea (Nystatin-triamcinolone) .... Use twice daily 12)  Drysol 20 % Soln (Aluminum chloride) .... Apply twice weekly 13)  Aluminum Chloride 20 % Soln (Aluminum chloride) .... Apply twice weekly  Patient Instructions: 1)  Please schedule a follow-up appointment as needed. and in the nonadherence and more fever was  Orders Added: 1)  Est. Patient Level III [16109]

## 2010-02-18 NOTE — Progress Notes (Signed)
Summary: sinus infection  LMTCB 04/08/2009  Phone Note Call from Patient   Caller: Patient Call For: Birdie Sons MD Summary of Call: Pt is still having sinus and ear congestion, and coughing up yellow.  Finished Doxycycline and is taking Mucinex and Sudafed. Nicolette Bang ( [place]) [phone] Initial call taken by: Lynann Beaver CMA,  April 08, 2009 9:26 AM  Follow-up for Phone Call        next step is CT sinuses (no contrast)---please schedule indication: chronic sinusitis Follow-up by: Birdie Sons MD,  April 08, 2009 10:12 AM  Additional Follow-up for Phone Call Additional follow up Details #1::        [facility] Additional Follow-up by: Lynann Beaver CMA,  April 08, 2009 10:27 AM    Additional Follow-up for Phone Call Additional follow up Details #2::    Pt want another antibiotic.  Cannot afford CT but will call his insurance company for price.  Pt called back and found out that he would have to pay 500. 00, and does not want to do this.  Wants a different antibiotic before he spends this amount to money. . Follow-up by: Lynann Beaver CMA,  April 08, 2009 11:53 AM  Additional Follow-up for Phone Call Additional follow up Details #3:: Details for Additional Follow-up Action Taken: see rx--pt's wife informed .Willy Eddy, LPN  April 10, 2009 3:20 PM  Additional Follow-up by: Birdie Sons MD,  April 09, 2009 12:43 PM  New/Updated Medications: AMOXICILLIN 500 MG TABS (AMOXICILLIN) Take 1 tablet by mouth three times a day Prescriptions: AMOXICILLIN 500 MG TABS (AMOXICILLIN) Take 1 tablet by mouth three times a day  #30 x 0   Entered and Authorized by:   Birdie Sons MD   Signed by:   Birdie Sons MD on 04/09/2009   Method used:   Electronically to        [facility]  [phone]* (retail)       [address]       [place], [place]  [address]       Ph: [phone] or [phone]       Fax: [phone]   RxID:   [id number]

## 2010-02-18 NOTE — Progress Notes (Signed)
Summary: Pt is faxing form that needs to be returned today asap  Phone Note Call from Patient   Caller: Patient Summary of Call: Pt is faxing a form to Dr. Cato Mulligan this morning that needs to be completed,signed and return before 3:30pm today. Pt is on a cruise ship and is needing this form completed by Dr. Cato Mulligan, in order to be able to go diving.   Initial call taken by: Lucy Ant[place],  March 19, 2009 10:47 AM  Follow-up for Phone Call        form complete.Patient notified via voice mail on cell phone. Follow-up by: Gladis Riffle, RN,  March 19, 2009 11:48 AM

## 2010-02-18 NOTE — Assessment & Plan Note (Signed)
Summary: flu shot/njr   Nurse Visit   Allergies: No Known Drug Allergies  Orders Added: 1)  Admin 1st Vaccine [90471] 2)  Flu Vaccine 2yrs + [14782]         Flu Vaccine Consent Questions     Do you have a history of severe allergic reactions to this vaccine? no    Any prior history of allergic reactions to egg and/or gelatin? no    Do you have a sensitivity to the preservative Thimersol? no    Do you have a past history of Guillan-Barre Syndrome? no    Do you currently have an acute febrile illness? no    Have you ever had a severe reaction to latex? no    Vaccine information given and explained to patient? yes    Are you currently pregnant? no    Lot Number:[id number]   Exp Date:07/19/2010   Site Given  Left Deltoid IM Romualdo Bolk, CMA Duncan Dull)  December 11, 2009 4:15 PM

## 2010-02-18 NOTE — Assessment & Plan Note (Signed)
Summary: 6 month rov./njr PT RSC/NJR   Vital Signs:  Patient profile:   55 year old male Weight:      218 pounds BMI:     31.39 Temp:     98.1 degrees F oral Pulse rate:   88 / minute Pulse rhythm:   regular Resp:     12 per minute BP sitting:   112 / 68  (left arm) Cuff size:   regular  Vitals Entered By: Gladis Riffle, RN (2009-06-06 8:31 AM) CC: 6 month rov, has brought lab results--c/o rash groin that needs hydroxizine dermatology had given him Is Patient Diabetic? No   Primary Care Provider:  Birdie Sons MD  CC:  6 month rov and has brought lab results--c/o rash groin that needs hydroxizine dermatology had given him.  History of Present Illness:  Follow-Up Visit      This is a 55 year old man who presents for Follow-up visit.  The patient denies chest pain and palpitations.  Since the last visit the patient notes no new problems or concerns except for ongoing insomnia. He admits to stressors---he states wife thinks he needs an antidepressant (lots of stress around Nurse, adult).  The patient reports taking meds as prescribed.  When questioned about possible medication side effects, the patient notes none.    All other systems reviewed and were negative   Preventive Screening-Counseling & Management  Alcohol-Tobacco     Alcohol drinks/day: 1     Alcohol type: beer     Smoking Status: quit > 6 months     Year Started: 1977     Year Quit: 1982  Current Problems (verified): 1)  Overweight  (ICD-278.02) 2)  Insomnia  (ICD-780.52) 3)  Peripheral Neuropathy  (ICD-356.9) 4)  Hyperglycemia  (ICD-790.29) 5)  Preventive Health Care  (ICD-V70.0) 6)  Transaminases, Serum, Elevated  (ICD-790.4) 7)  Hyperlipidemia  (ICD-272.4)  Current Medications (verified): 1)  Zolpidem Tartrate 5 Mg  Tabs (Zolpidem Tartrate) .[name].. 1 By Mouth At Night As Needed 2)  Fluticasone Propionate 50 Mcg/act Susp (Fluticasone Propionate) .... Spray 1 Puff Into Both Nostrils Once A Day As Needed 3)   Vytorin 10-40 Mg Tabs (Ezetimibe-Simvastatin) .... Take 1 Tablet By Mouth Once A Day 4)  Multivitamins   Tabs (Multiple Vitamin) .... Once Daily 5)  Glucosamine-Chondroitin 500-400 Mg  Tabs (Glucosamine-Chondroitin) .... Once Daily 6)  Omega-3 350 Mg  Caps (Omega-3 Fatty Acids) .... Once Daily 7)  Metamucil 30.9 %  Powd (Psyllium) .... Once Daily 8)  Saw Palmetto Berry 160 Mg  Caps (Saw Palmetto (Serenoa Repens)) .... Once Daily 9)  Claritin 10 Mg Tabs (Loratadine) .... Once Daily As Needed 10)  Hydroxyzine Hcl 10 Mg Tabs (Hydroxyzine Hcl) .[name].. 1-2 Hs As Needed 11)  Desonide 0.05 % Crea (Desonide) .... Use Two Times A Day As Directed  Allergies (verified): No Known Drug Allergies  Past History:  Past Medical History: Last updated: 10/23/2008 Hyperlipidemia chronic low back pain Overweight  Past Surgical History: Last updated: 11/29/2005 Inguinal herniorrhaphy  Family History: Last updated: Jun 06, 2009 father deceased CAD 77yo  Family History of CAD Male 1st degree relative <50  Family History High cholesterol Family History Hypertension Mother alive and well Family History Diabetes 1st degree relative-brother (type 2)  Social History: Last updated: 12/06/2006 Married Former Smoker Alcohol use-yes Occupation: Tax adviser for [employer] Religion affecting care-[employer]  Risk Factors: Alcohol Use: 1 (06-06-2009) Exercise: yes (11/22/2008)  Risk Factors: Smoking Status: quit > 6  months (05/13/2009)  Family History: father deceased CAD 11yo  Family History of CAD Male 1st degree relative <50  Family History High cholesterol Family History Hypertension Mother alive and well Family History Diabetes 1st degree relative-brother (type 2)  Physical Exam  General:  alert and well-developed.   Head:  normocephalic and atraumatic.   Eyes:  pupils equal and pupils round.   Ears:  R ear normal and L ear normal.   Neck:  No deformities, masses, or  tenderness noted. Chest Wall:  No deformities, masses, tenderness or gynecomastia noted. Lungs:  normal respiratory effort and no accessory muscle use.   Heart:  normal rate and regular rhythm.   Abdomen:  Bowel sounds positive,abdomen soft and non-tender without masses, organomegaly or hernias noted.  overweight.  Msk:  No deformity or scoliosis noted of thoracic or lumbar spine.   Extremities:  No clubbing, cyanosis, edema, or deformity noted  Neurologic:  cranial nerves II-XII intact and gait normal.   Skin:  turgor normal and color normal.   Psych:  memory intact for recent and remote and good eye contact.     Impression & Recommendations:  Problem # 1:  INSOMNIA (ICD-780.52) reasonable control His updated medication list for this problem includes:    Zolpidem Tartrate 5 Mg Tabs (Zolpidem tartrate) .[name]... 1 by mouth at night as needed  Problem # 2:  HYPERGLYCEMIA (ICD-790.29) glucose 117 discussed need for weight loss continued exercise.   Problem # 3:  TRANSAMINASES, SERUM, ELEVATED (ICD-790.4) reviewed labs from [facility] stable discussed anxiety---no treatment at this time he will monitro sxs  Complete Medication List: 1)  Zolpidem Tartrate 5 Mg Tabs (Zolpidem tartrate) .[name].. 1 by mouth at night as needed 2)  Fluticasone Propionate 50 Mcg/act Susp (Fluticasone propionate) .... Spray 1 puff into both nostrils once a day as needed 3)  Vytorin 10-40 Mg Tabs (Ezetimibe-simvastatin) .... Take 1 tablet by mouth once a day 4)  Multivitamins Tabs (Multiple vitamin) .... Once daily 5)  Glucosamine-chondroitin 500-400 Mg Tabs (Glucosamine-chondroitin) .... Once daily 6)  Omega-3 350 Mg Caps (Omega-3 fatty acids) .... Once daily 7)  Metamucil 30.9 % Powd (Psyllium) .... Once daily 8)  Saw Palmetto Berry 160 Mg Caps (Saw palmetto (serenoa repens)) .... Once daily 9)  Claritin 10 Mg Tabs (Loratadine) .... Once daily as needed 10)  Hydroxyzine Hcl 10 Mg Tabs (Hydroxyzine hcl) .... Take  1 tablet by mouth two times a day as needed 11)  Desonide 0.05 % Crea (Desonide) .... Use two times a day as directed  Patient Instructions: 1)  Please schedule a follow-up appointment in 4 months. 2)  labs one week prior to visit 3)  lipids---272.4 4)  lfts-995.2 5)  bmet-995.2 6)  A1C-250.02 7)     Prescriptions: HYDROXYZINE HCL 10 MG TABS (HYDROXYZINE HCL) Take 1 tablet by mouth two times a day as needed  #30 x 3   Entered and Authorized by:   Birdie Sons MD   Signed by:   Birdie Sons MD on 05/13/2009   Method used:   Electronically to        [facility]  [phone]* (retail)       [address]       [place], [place]  [address]       Ph: [phone] or [phone]       Fax: [phone]   RxID:   [id number]

## 2010-02-18 NOTE — Assessment & Plan Note (Signed)
Summary: 4 month fup//ccm   Vital Signs:  Patient profile:   55 year old male Weight:      215 pounds BMI:     30.96 Pulse rate:   78 / minute Pulse rhythm:   regular Resp:     12 per minute BP sitting:   114 / 78  (left arm) Cuff size:   regular  Vitals Entered By: Gladis Riffle, RN (10/01/09 8:06 AM) CC: 4 month rov, labs done Is Patient Diabetic? No   Primary Care Provider:  Birdie Sons MD  CC:  4 month rov and labs done.  History of Present Illness:  Follow-Up Visit      This is a 55 year old man who presents for Follow-up visit.  The patient denies chest pain and palpitations.  Since the last visit the patient notes no new problems or concerns.  The patient reports taking meds as prescribed.  When questioned about possible medication side effects, the patient notes none.    All other systems reviewed and were negative   Preventive Screening-Counseling & Management  Alcohol-Tobacco     Alcohol drinks/day: 1     Alcohol type: beer     Smoking Status: quit > 6 months     Year Started: 1977     Year Quit: 1982  Current Problems (verified): 1)  Overweight  (ICD-278.02) 2)  Insomnia  (ICD-780.52) 3)  Peripheral Neuropathy  (ICD-356.9) 4)  Hyperglycemia  (ICD-790.29) 5)  Preventive Health Care  (ICD-V70.0) 6)  Transaminases, Serum, Elevated  (ICD-790.4) 7)  Hyperlipidemia  (ICD-272.4)  Current Medications (verified): 1)  Zolpidem Tartrate 5 Mg  Tabs (Zolpidem Tartrate) .[name].. 1 By Mouth At Night As Needed--Each Rx Must Last 30 Days 2)  Fluticasone Propionate 50 Mcg/act Susp (Fluticasone Propionate) .... Spray 1 Puff Into Both Nostrils Once A Day As Needed 3)  Vytorin 10-40 Mg Tabs (Ezetimibe-Simvastatin) .... Take 1 Tablet By Mouth Once A Day 4)  Multivitamins   Tabs (Multiple Vitamin) .... Once Daily 5)  Glucosamine-Chondroitin 500-400 Mg  Tabs (Glucosamine-Chondroitin) .... Once Daily 6)  Omega-3 350 Mg  Caps (Omega-3 Fatty Acids) .... Once Daily 7)   Metamucil 30.9 %  Powd (Psyllium) .... Once Daily 8)  Saw Palmetto Berry 160 Mg  Caps (Saw Palmetto (Serenoa Repens)) .... Once Daily 9)  Claritin 10 Mg Tabs (Loratadine) .... Once Daily As Needed 10)  Hydroxyzine Hcl 10 Mg Tabs (Hydroxyzine Hcl) .... Take 1 Tablet By Mouth Two Times A Day As Needed  Allergies (verified): No Known Drug Allergies  Past History:  Past Medical History: Last updated: 10/23/2008 Hyperlipidemia chronic low back pain Overweight  Past Surgical History: Last updated: 11/29/2005 Inguinal herniorrhaphy  Family History: Last updated: 10/01/2009 father deceased CAD 86yo  Family History of CAD Male 1st degree relative <50  Family History High cholesterol Family History Hypertension Mother alive and well Family History Diabetes 1st degree relative-brother (type 2) Grandfather with DM  Social History: Last updated: 12/06/2006 Married Former Smoker Alcohol use-yes Occupation: Tax adviser for [employer] Religion affecting care-[employer]  Risk Factors: Alcohol Use: 1 (2009-10-01) Exercise: yes (11/22/2008)  Risk Factors: Smoking Status: quit > 6 months (01-Oct-2009)  Family History: father deceased CAD 59yo  Family History of CAD Male 1st degree relative <50  Family History High cholesterol Family History Hypertension Mother alive and well Family History Diabetes 1st degree relative-brother (type 2) Grandfather with DM  Physical Exam  General:  alert and well-developed.  Head:  normocephalic and atraumatic.   Eyes:  pupils equal and pupils round.   Ears:  R ear normal and L ear normal.   Neck:  No deformities, masses, or tenderness noted. Chest Wall:  No deformities, masses, tenderness or gynecomastia noted. Lungs:  normal respiratory effort and no accessory muscle use.   Heart:  normal rate and regular rhythm.   Abdomen:  overweight.  Msk:  No deformity or scoliosis noted of thoracic or lumbar spine.   Neurologic:   cranial nerves II-XII intact and gait normal.     Impression & Recommendations:  Problem # 1:  OVERWEIGHT (ICD-278.02) advised aggressive weight loss initial goal weight <200 pounds states he has started doing eliptical  he admits to less stamina when he is working outside  Problem # 2:  TRANSAMINASES, SERUM, ELEVATED (ICD-790.4) resolved  Problem # 3:  HYPERLIPIDEMIA (ICD-272.4) adequate control hdl low---would likely improve with weight loss and daily exercise His updated medication list for this problem includes:    Vytorin 10-40 Mg Tabs (Ezetimibe-simvastatin) .[name]... Take 1 tablet by mouth once a day  Labs Reviewed: SGOT: 43 (09/09/2009)   SGPT: 74 (09/09/2009)  Lipid Goals: Chol Goal: 200 (11/22/2008)   HDL Goal: 40 (11/22/2008)   LDL Goal: 130 (11/22/2008)   TG Goal: 150 (11/22/2008)  Prior 10 Yr Risk Heart Disease: 9 % (11/22/2008)   HDL:32.60 (09/09/2009), 34.20 (10/29/2008)  LDL:107 (09/09/2009), 123 (10/06/2007)  Chol:160 (09/09/2009), 213 (10/29/2008)  Trig:103.0 (09/09/2009), 157.0 (10/29/2008)  Problem # 4:  PALPITATIONS (ICD-785.1) he reports after working outside one day he had a fast heart rate and irregular resolved in 10 minutes no recurrence would not be fruitful to perform an evaluation at this time.  Problem # 5:  INSOMNIA (ICD-780.52) discussed at length advised him to avoid meds he admits that he is dependent on meds ("I can't sleep otherwise").  His updated medication list for this problem includes:    Zolpidem Tartrate 5 Mg Tabs (Zolpidem tartrate) .[name]... 1 by mouth at night as needed--each rx must last 30 days  Complete Medication List: 1)  Zolpidem Tartrate 5 Mg Tabs (Zolpidem tartrate) .[name].. 1 by mouth at night as needed--each rx must last 30 days 2)  Fluticasone Propionate 50 Mcg/act Susp (Fluticasone propionate) .... Spray 1 puff into both nostrils once a day as needed 3)  Vytorin 10-40 Mg Tabs (Ezetimibe-simvastatin) .... Take 1 tablet by  mouth once a day 4)  Multivitamins Tabs (Multiple vitamin) .... Once daily 5)  Glucosamine-chondroitin 500-400 Mg Tabs (Glucosamine-chondroitin) .... Once daily 6)  Omega-3 350 Mg Caps (Omega-3 fatty acids) .... Once daily 7)  Metamucil 30.9 % Powd (Psyllium) .... Once daily 8)  Saw Palmetto Berry 160 Mg Caps (Saw palmetto (serenoa repens)) .... Once daily 9)  Claritin 10 Mg Tabs (Loratadine) .... Once daily as needed 10)  Hydroxyzine Hcl 10 Mg Tabs (Hydroxyzine hcl) .... Take 1 tablet by mouth two times a day as needed  Other Orders: Tdap => 36yrs IM (09811) Admin 1st Vaccine (91478)  Patient Instructions: 1)  Please schedule a follow-up appointment in 6 months. Prescriptions: ZOLPIDEM TARTRATE 5 MG  TABS (ZOLPIDEM TARTRATE) 1 by mouth at night as needed--each Rx must last 30 days  #30 x 6   Entered and Authorized by:   Birdie Sons MD   Signed by:   Birdie Sons MD on 09/16/2009   Method used:   Print then Give to Patient   RxID:   [id number]    Immunizations Administered:  Tetanus Vaccine:    Vaccine Type: Tdap    Site: left deltoid    Mfr: GlaxoSmithKline    Dose: 0.5 ml    Route: IM    Given by: Gladis Riffle, RN    Exp. Date: 11/08/2011    Lot #: [id number]    VIS given: 12/07/06 version given September 16, 2009.

## 2010-03-12 ENCOUNTER — Other Ambulatory Visit (INDEPENDENT_AMBULATORY_CARE_PROVIDER_SITE_OTHER): Payer: Self-pay | Admitting: Internal Medicine

## 2010-03-12 DIAGNOSIS — Z0000 Encounter for general adult medical examination without abnormal findings: Secondary | ICD-10-CM

## 2010-03-12 LAB — LIPID PANEL
HDL: 37.4 mg/dL — ABNORMAL LOW (ref >39.00)
LDL Cholesterol: 88 mg/dL (ref 0–99)
Triglycerides: 161 mg/dL — ABNORMAL HIGH (ref 0.0–149.0)
VLDL: 32.2 mg/dL (ref 0.0–40.0)

## 2010-03-12 LAB — POCT URINALYSIS DIPSTICK
Nitrite, UA: NEGATIVE
Urobilinogen, UA: 1

## 2010-03-12 LAB — BASIC METABOLIC PANEL
CO2: 28 mEq/L (ref 19–32)
Creatinine, Ser: 0.9 mg/dL (ref 0.4–1.5)
GFR: 93.85 mL/min (ref >60.00)
Glucose, Bld: 124 mg/dL — ABNORMAL HIGH (ref 70–99)
Potassium: 4.2 mEq/L (ref 3.5–5.1)
Sodium: 142 mEq/L (ref 135–145)

## 2010-03-12 LAB — CBC WITH DIFFERENTIAL/PLATELET
Basophils Absolute: 0 10*3/uL (ref 0.0–0.1)
Basophils Relative: 0.5 % (ref 0.0–3.0)
Eosinophils Relative: 2.4 % (ref 0.0–5.0)
HCT: 45.7 % (ref 39.0–52.0)
Hemoglobin: 15.9 g/dL (ref 13.0–17.0)
MCV: 94.7 fl (ref 78.0–100.0)
Monocytes Relative: 9.9 % (ref 3.0–12.0)
Neutrophils Relative %: 56.2 % (ref 43.0–77.0)
RBC: 4.82 Mil/uL (ref 4.22–5.81)

## 2010-03-12 LAB — HEPATIC FUNCTION PANEL
ALT: 71 U/L — ABNORMAL HIGH (ref 0–53)
AST: 44 U/L — ABNORMAL HIGH (ref 0–37)
Alkaline Phosphatase: 61 U/L (ref 39–117)

## 2010-03-12 LAB — TSH: TSH: 1.26 u[IU]/mL (ref 0.35–5.50)

## 2010-03-20 ENCOUNTER — Encounter: Payer: Self-pay | Admitting: Internal Medicine

## 2010-03-20 ENCOUNTER — Ambulatory Visit (INDEPENDENT_AMBULATORY_CARE_PROVIDER_SITE_OTHER): Payer: BC Managed Care – PPO | Admitting: Internal Medicine

## 2010-03-20 DIAGNOSIS — E785 Hyperlipidemia, unspecified: Secondary | ICD-10-CM

## 2010-03-20 DIAGNOSIS — M533 Sacrococcygeal disorders, not elsewhere classified: Secondary | ICD-10-CM

## 2010-03-20 DIAGNOSIS — R002 Palpitations: Secondary | ICD-10-CM

## 2010-03-20 DIAGNOSIS — R7309 Other abnormal glucose: Secondary | ICD-10-CM

## 2010-03-20 DIAGNOSIS — M25529 Pain in unspecified elbow: Secondary | ICD-10-CM

## 2010-03-20 DIAGNOSIS — M25519 Pain in unspecified shoulder: Secondary | ICD-10-CM

## 2010-03-20 DIAGNOSIS — E663 Overweight: Secondary | ICD-10-CM

## 2010-03-20 MED ORDER — EZETIMIBE-SIMVASTATIN 10-40 MG PO TABS: 1.0000 | ORAL_TABLET | Freq: Every day | ORAL | Status: DC

## 2010-03-20 MED ORDER — ZOLPIDEM TARTRATE 10 MG PO TABS: 10.0000 mg | ORAL_TABLET | Freq: Every evening | ORAL | Status: DC | PRN

## 2010-03-20 NOTE — Assessment & Plan Note (Signed)
Discussed need for aggressive weight loss. He should exercise daily. Follow low-calorie diet.

## 2010-03-20 NOTE — Progress Notes (Signed)
  Subjective:    Patient ID: Brandon Frank, male    DOB: [birth year], 55 y.o.   MRN: [id number]  HPI  cpx  He has new complaints:  Right shoulder---long term pain---worse over past months. No acute injury. No swelling. Saw ortho apporx 2 years ago---sxs progressive.   Right elbow---pain after closing spa cover---started 4 weeks ago.   Palpitations---has been ongoing for years---had an episode 3 nights ago. Feels like my heart skips  Coccyx---pain for years (at least 10)---he had an injection a few years ago steroid---since that time has had increased sweating and recurrent rash.  Past Medical History  Diagnosis Date  . Hyperlipidemia   . Chronic low back pain    Past Surgical History  Procedure Date  . Hernia repair     reports that he quit smoking about 35 years ago. He does not have any smokeless tobacco history on file. He reports that he drinks alcohol. His drug history not on file. family history includes Diabetes in his brother and paternal grandfather; Heart disease in his father; Hyperlipidemia in his brother and father; and Hypertension in his brother and father.    Not on File   Review of Systems  patient denies chest pain, shortness of breath, orthopnea. Denies lower extremity edema, abdominal pain, change in appetite, change in bowel movements. Patient denies rashes, musculoskeletal complaints. No other specific complaints in a complete review of systems.      Objective:   Physical Exam Well-developed male in no acute distress. HEENT exam atraumatic, normocephalic, extraocular muscles are intact. Conjunctivae are pink without exudate. Neck is supple without lymphadenopathy, thyromegaly, jugular venous distention. Chest is clear to auscultation without increased work of breathing. Cardiac exam S1-S2 are regular. The PMI is normal. No significant murmurs or gallops. Abdominal exam active bowel sounds, soft, nontender. No abdominal bruits. Extremities no clubbing  cyanosis or edema. Peripheral pulses are normal without bruits. Neurologic exam alert and oriented without any motor or sensory deficits. Rectal exam normal tone prostate normal size without masses or asymmetry.        Assessment & Plan:   patient has no multiple negative complaints including coccydynia, shoulder pain, elbow pain, palpitations. I have reviewed Cardiolite from 2 years ago. I don't think any further cardiac evaluation is necessary. His palpitations are infrequent and short-lived. In regards to her shoulder pain this is been ongoing for years. I will get the x-ray from his previous orthopedist. He may need an injection. In regards to his elbow pain I asked him to ice the elbow every night for one month.   regards to his coccydynia he has chronic pain. Ongoing for at least 10 years. he states this affects his ability to do things that he enjoys. I think she needs any specific evaluation or tertiary care center. I'll make that referral.

## 2010-03-20 NOTE — Assessment & Plan Note (Signed)
Controlled.  Continue current medications.

## 2010-03-20 NOTE — Assessment & Plan Note (Signed)
Discussed need for aggressive weight loss. He is overweight. He needs to follow a low-calorie diet. He needs to exercise daily. He is at risk of developing diabetes. This was discussed with him in detail.

## 2010-04-19 ENCOUNTER — Other Ambulatory Visit: Payer: Self-pay | Admitting: Internal Medicine

## 2010-04-19 DIAGNOSIS — J383 Other diseases of vocal cords: Secondary | ICD-10-CM

## 2010-04-19 DIAGNOSIS — R490 Dysphonia: Secondary | ICD-10-CM

## 2010-06-10 ENCOUNTER — Ambulatory Visit (INDEPENDENT_AMBULATORY_CARE_PROVIDER_SITE_OTHER): Payer: BC Managed Care – PPO | Admitting: Internal Medicine

## 2010-06-10 VITALS — BP 116/78 | HR 84 | Temp 98.5°F | Ht 70.0 in | Wt 222.0 lb

## 2010-06-10 DIAGNOSIS — R490 Dysphonia: Secondary | ICD-10-CM

## 2010-06-10 MED ORDER — OMEPRAZOLE 20 MG PO CPDR: 20.0000 mg | DELAYED_RELEASE_CAPSULE | Freq: Every day | ORAL | Status: DC

## 2010-06-10 MED ORDER — EZETIMIBE-SIMVASTATIN 10-40 MG PO TABS: 1.0000 | ORAL_TABLET | Freq: Every day | ORAL | Status: DC

## 2010-06-10 NOTE — Progress Notes (Signed)
  Subjective:    Patient ID: Brandon Frank, male    DOB: 10/07/1955, 55 y.o.   MRN: [id number]  HPI  Hoarse voice x 6 weeks- better today. He denies associated allergic sxs. But he tried otc claritin--no relief of sxs. No risk factors for head and neck cancers. He does admit to occasional GERD sxs (can be nocturnal).  Past Medical History  Diagnosis Date  . Hyperlipidemia   . Chronic low back pain    Past Surgical History  Procedure Date  . Hernia repair     reports that he quit smoking about 35 years ago. He does not have any smokeless tobacco history on file. He reports that he drinks alcohol. His drug history not on file. family history includes Diabetes in his brother and paternal grandfather; Heart disease in his father; Hyperlipidemia in his brother and father; and Hypertension in his brother and father. Not on File  Review of Systems  patient denies chest pain, shortness of breath, orthopnea. Denies lower extremity edema, abdominal pain, change in appetite, change in bowel movements. Patient denies rashes, musculoskeletal complaints. No other specific complaints in a complete review of systems.      Objective:   Physical Exam  well-developed well-nourished male in no acute distress. HEENT exam atraumatic, normocephalic, neck supple without jugular venous distention. Chest clear to auscultation cardiac exam S1-S2 are regular. Abdominal exam overweight with bowel sounds, soft and nontender. Extremities no edema. Neurologic exam is alert with a normal gait.       Assessment & Plan:  Subjective hoarseness Will refer to ENT for vocal cord assessment  Start PPI---side effects discussed

## 2010-08-11 ENCOUNTER — Ambulatory Visit (INDEPENDENT_AMBULATORY_CARE_PROVIDER_SITE_OTHER): Payer: BC Managed Care – PPO | Admitting: Internal Medicine

## 2010-08-11 ENCOUNTER — Encounter: Payer: Self-pay | Admitting: Internal Medicine

## 2010-08-11 VITALS — BP 140/80

## 2010-08-11 DIAGNOSIS — M545 Low back pain, unspecified: Secondary | ICD-10-CM

## 2010-08-11 MED ORDER — CYCLOBENZAPRINE HCL 10 MG PO TABS: 10.0000 mg | ORAL_TABLET | Freq: Three times a day (TID) | ORAL | Status: DC | PRN

## 2010-08-11 MED ORDER — METHYLPREDNISOLONE ACETATE 80 MG/ML IJ SUSP
80.0000 mg | Freq: Once | INTRAMUSCULAR | Status: AC
  Administered 2010-08-11: 80 mg via INTRAMUSCULAR

## 2010-08-11 NOTE — Patient Instructions (Signed)
Most patients with low back pain will improve with time over the next two to 6 weeks.  Keep active but avoid any activities that cause pain.  Apply moist heat to the low back area several times daily.   Take Aleve 200 mg  2 tablets twice daily for pain

## 2010-08-11 NOTE — Progress Notes (Signed)
  Subjective:    Patient ID: KAYODE PETION, male    DOB: [birth year], 55 y.o.   MRN: [id number]  HPI  55 year old patient who presents with a one-week history of low back pain. There's been no trauma. Pain is in the high lumbar region without radiation. He has had temporary benefit from massage therapy and also slight a chiropractor last week without improvement there's been no fever or other constitutional complaints. Pain is aggravated by movement and walking but he is comfortable at rest. He has used Flexeril in the past more for a sleep aid. He feels poorly on tramadol and hydrocodone    Review of Systems  Musculoskeletal: Positive for back pain.       Objective:   Physical Exam  Constitutional: He appears well-developed and well-nourished. No distress.  Musculoskeletal:       There is some tenderness and tight tense musculature in the paravertebral high lumbar region  Straight leg testing was negative  No motor weakness Gait normal          Assessment & Plan:   Back pain. Suspect musculoligamentous. We'll treat with is  Depo-Medrol 80 mg IM we'll give a prescription for Flexeril.

## 2010-09-27 ENCOUNTER — Other Ambulatory Visit: Payer: Self-pay | Admitting: Internal Medicine

## 2010-11-12 ENCOUNTER — Ambulatory Visit (INDEPENDENT_AMBULATORY_CARE_PROVIDER_SITE_OTHER): Payer: BC Managed Care – PPO | Admitting: Family Medicine

## 2010-11-12 ENCOUNTER — Encounter: Payer: Self-pay | Admitting: Family Medicine

## 2010-11-12 VITALS — BP 120/82 | Temp 98.3°F | Wt 220.0 lb

## 2010-11-12 DIAGNOSIS — M25519 Pain in unspecified shoulder: Secondary | ICD-10-CM

## 2010-11-12 DIAGNOSIS — M75101 Unspecified rotator cuff tear or rupture of right shoulder, not specified as traumatic: Secondary | ICD-10-CM

## 2010-11-12 DIAGNOSIS — S43429A Sprain of unspecified rotator cuff capsule, initial encounter: Secondary | ICD-10-CM

## 2010-11-12 DIAGNOSIS — Z23 Encounter for immunization: Secondary | ICD-10-CM

## 2010-11-12 DIAGNOSIS — M25511 Pain in right shoulder: Secondary | ICD-10-CM

## 2010-11-12 MED ORDER — MELOXICAM 15 MG PO TABS: 15.0000 mg | ORAL_TABLET | Freq: Every day | ORAL | Status: DC

## 2010-11-12 NOTE — Patient Instructions (Signed)
Touch base by Friday to give feedback regarding shoulder Try icing shoulder 20-30 minutes 2-3 times daily

## 2010-11-12 NOTE — Progress Notes (Addendum)
  Subjective:    Patient ID: Brandon Frank, male    DOB: 1955-03-16, 55 y.o.   MRN: [id number]  HPI  Acute visit. New problem of right shoulder pain. Onset about 12 days ago. Was on vacation and walked into a glass door. Did not land on his shoulder but felt immediate pain and initially thought he may have had a shoulder dislocation. No prior history of dislocation. Pain poorly localized. No associated bruising. Since then has had moderate to severe pain especially at night. Has tried heat and ibuprofen with minimal if any relief. He is noticing progressive weakness especially with abduction and external rotation. No neck pain. No upper extremity numbness.   Review of Systems  Constitutional: Negative for fever and chills.  HENT: Negative for neck pain.   Cardiovascular: Negative for chest pain.  Musculoskeletal: Negative for myalgias, back pain and joint swelling.       Objective:   Physical Exam  Constitutional: He appears well-developed and well-nourished.  Neck: Neck supple.  Cardiovascular: Normal rate and regular rhythm.   Pulmonary/Chest: Effort normal and breath sounds normal. No respiratory distress. He has no wheezes. He has no rales.  Musculoskeletal:       Shoulders symmetric in appearance. No ecchymosis. He has significant pain with external rotation and abduction right shoulder. Only very minimal pain with internal rotation. He has significant weakness with external rotation and abduction. No a.c. joint tenderness. No bicipital tenderness.  Lymphadenopathy:    He has no cervical adenopathy.  Neurological:       Deep tendon reflexes symmetric upper extremities          Assessment & Plan:  Right shoulder pain. Concern for possible rotator cuff tear.  Meloxicam15 mg once daily. Icing 2-3 times daily.  Discussed risks and benefits of corticosteroid injection and patient consented.  After prepping skin with betadine, injected 40 mg depomedrol and 2 cc of plain xylocaine  with 23 gauge one and one half inch needle using posterior lateral approach and pt tolerate well.  Touch base later in week. If no improvement consider MRI scan to further evaluate to rule out rotator cuff tear   MRI confirms full thickness tear of infraspinatus and supraspinatus.  Pt aware and will set up orthopedic referral.

## 2010-11-14 ENCOUNTER — Telehealth: Payer: Self-pay

## 2010-11-14 DIAGNOSIS — M25511 Pain in right shoulder: Secondary | ICD-10-CM

## 2010-11-14 NOTE — Telephone Encounter (Signed)
Pt called and stated he was in to see Dr. Caryl Never and received an injection. Pt stated he was told to call and report how his shoulder was feeling. Pt states that his arm is not better and he would like to have an MRI.  Pls advise.

## 2010-11-14 NOTE — Telephone Encounter (Signed)
Give this until Monday.  Suspect we will need to do MRI if no better.  Touch base Monday and if no better then will schedule.

## 2010-11-17 NOTE — Telephone Encounter (Signed)
Left a message for pt to return call 

## 2010-11-18 NOTE — Telephone Encounter (Signed)
Agree.  Ordered.  Let pt know this has been ordered.

## 2010-11-18 NOTE — Telephone Encounter (Signed)
Pt called and left a message stating he was still not feeling any better and would like to have the MRI done.  Pls advise.

## 2010-11-18 NOTE — Telephone Encounter (Signed)
Pt aware.

## 2010-11-20 DIAGNOSIS — M75100 Unspecified rotator cuff tear or rupture of unspecified shoulder, not specified as traumatic: Secondary | ICD-10-CM

## 2010-11-20 HISTORY — DX: Unspecified rotator cuff tear or rupture of unspecified shoulder, not specified as traumatic: M75.100

## 2010-11-24 ENCOUNTER — Ambulatory Visit
Admission: RE | Admit: 2010-11-24 | Discharge: 2010-11-24 | Disposition: A | Discharge status: Home / Self Care | Payer: BC Managed Care – PPO | Source: Ambulatory Visit | Attending: Family Medicine | Admitting: Family Medicine

## 2010-11-24 DIAGNOSIS — M25511 Pain in right shoulder: Secondary | ICD-10-CM

## 2010-11-26 NOTE — Progress Notes (Signed)
Addended by: Kristian Covey on: 11/26/2010 08:13 AM   Modules accepted: Orders

## 2010-12-15 ENCOUNTER — Ambulatory Visit (INDEPENDENT_AMBULATORY_CARE_PROVIDER_SITE_OTHER): Payer: BC Managed Care – PPO | Admitting: Family Medicine

## 2010-12-15 ENCOUNTER — Encounter: Payer: Self-pay | Admitting: Family Medicine

## 2010-12-15 ENCOUNTER — Telehealth: Payer: Self-pay | Admitting: Family Medicine

## 2010-12-15 VITALS — BP 136/80 | HR 107 | Temp 98.9°F

## 2010-12-15 DIAGNOSIS — F064 Anxiety disorder due to known physiological condition: Secondary | ICD-10-CM

## 2010-12-15 MED ORDER — LORAZEPAM 0.5 MG PO TABS: 0.5000 mg | ORAL_TABLET | Freq: Four times a day (QID) | ORAL | Status: DC | PRN

## 2010-12-15 NOTE — Progress Notes (Signed)
  Subjective:    Patient ID: Brandon Frank, male    DOB: 1955-05-24, 55 y.o.   MRN: [id number]  HPI Here with his wife for acute anxiety. He fell and tore his right shoulder rotator cuff last month, and he had this surgically repaired on 12-04-10 by Dr. Ranell Patrick. 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 However since the surgery he has had a lot of anxiety, and this has become more of an issue in the past few days. He feels very nervous, feels overwhelmed, and he worries about anything bad that can happen (he will never work again, his wife will leave him, etc.). He has never felt this way before. He denies any depression symptoms. He sleeps well with Ambien.    Review of Systems  Constitutional: Negative.   Respiratory: Negative.   Cardiovascular: Negative.   Psychiatric/Behavioral: Positive for decreased concentration. Negative for hallucinations and dysphoric mood. The patient is nervous/anxious.        Objective:   Physical Exam  Constitutional: He appears well-developed and well-nourished.       Wearing a sling and brace on the right arm.   Psychiatric: His behavior is normal. Judgment and thought content normal.       Anxious but appropriate          Assessment & Plan:  This seems to be a reactive anxiety stemming from his recent health issues. Try a short course of Ativan prn. Follow up with Dr. Cato Mulligan prn

## 2010-12-15 NOTE — Telephone Encounter (Signed)
Triage Record Num: [id number] Operator: Tana Felts Patient Name: Brandon Frank Call Date & Time: 12/14/2010 9:16:27AM Patient Phone: [phone] PCP: Valetta Mole. Swords Patient Gender: Male PCP Fax : [phone] Patient DOB: 06/06/1955 [facility] Name[facility] Reason for Call: Caller: [name]/Patient; PCP: Birdie Sons; CB#: [phone]; Call Reason: Anxiety; Sx Onset: 12/07/2010; Sx Notes:pt feels anxious about returning to work this week post shoulder surgery, feels overwhelmed at times, unable to be around crowds at this point; Afebrile; ; Home treatment(s) tried: Acetaminophen, ; Did home treatment help?: No; Guideline Used:Anxiety ; Disp:See Provider within 24 hours; Appt Scheduled?: No, advised to call office during business hours of 8:30-5:00 Protocol(s) Used: Anxiety Recommended Outcome per Protocol: See Provider within 24 hours Reason for Outcome: Feeling overwhelmed AND unable to calm down Care Advice: ~ Should not be alone, arrange for support (family member, friend, etc.). ~ Call provider if symptoms worsen or new symptoms develop. Distraction through talking with friends, listening to music, writing, going for a walk or ride can help relieve symptoms. ~ Avoid the use of stimulants including caffeine (coffee, some soft drinks, some energy drinks, tea and chocolate), cocaine, and amphetamines. Also avoid drinking alcohol. ~ ~ List, or take, all current prescription(s), nonprescription or alternative medication(s) to provider for evaluation. 11/

## 2011-01-28 ENCOUNTER — Other Ambulatory Visit: Payer: Self-pay | Admitting: Internal Medicine

## 2011-01-28 NOTE — Telephone Encounter (Signed)
Dr Clent Ridges seen pt for anxiety

## 2011-01-28 NOTE — Telephone Encounter (Signed)
Pt was prescribed lorazepam 0.5mg  by dr fry requesting refill call into [facility] [address]

## 2011-01-28 NOTE — Telephone Encounter (Signed)
Would recommend decreased frequency:  Lorazepam 0.5 mg po qd prn. Not to exceed 20 per month.  #20/3 refills

## 2011-01-29 MED ORDER — LORAZEPAM 0.5 MG PO TABS: 0.5000 mg | ORAL_TABLET | Freq: Every day | ORAL | Status: DC | PRN

## 2011-01-29 NOTE — Telephone Encounter (Signed)
rx called in, pt aware 

## 2011-01-30 ENCOUNTER — Other Ambulatory Visit: Payer: Self-pay | Admitting: Internal Medicine

## 2011-02-03 ENCOUNTER — Ambulatory Visit (INDEPENDENT_AMBULATORY_CARE_PROVIDER_SITE_OTHER): Payer: BC Managed Care – PPO | Admitting: Internal Medicine

## 2011-02-03 ENCOUNTER — Encounter: Payer: Self-pay | Admitting: Internal Medicine

## 2011-02-03 VITALS — BP 120/84 | HR 72 | Temp 98.5°F | Wt 220.0 lb

## 2011-02-03 DIAGNOSIS — F411 Generalized anxiety disorder: Secondary | ICD-10-CM

## 2011-02-03 DIAGNOSIS — F419 Anxiety disorder, unspecified: Secondary | ICD-10-CM

## 2011-02-03 HISTORY — DX: Anxiety disorder, unspecified: F41.9

## 2011-02-03 MED ORDER — PAROXETINE MESYLATE 20 MG PO TABS: 20.0000 mg | ORAL_TABLET | ORAL | Status: DC

## 2011-02-03 MED ORDER — FLUTICASONE PROPIONATE 50 MCG/ACT NA SUSP: 1.0000 | Freq: Every day | NASAL | Status: DC

## 2011-02-03 MED ORDER — LORAZEPAM 0.5 MG PO TABS: 0.5000 mg | ORAL_TABLET | Freq: Every day | ORAL | Status: DC | PRN

## 2011-02-03 NOTE — Progress Notes (Signed)
Patient ID: Brandon Frank, male   DOB: 1955-12-19, 56 y.o.   MRN: [id number] AFter rotator cuff surgery he has noticed increased anxiety. He admits to chronic anxiety but states that he is relying on lorazepam every day since rotator cuff surgery.  He also admits to a chronic "negative attitude", "not really depressed".

## 2011-02-03 NOTE — Assessment & Plan Note (Addendum)
Long discussion with patient regarding polypharmacy (ambien, lorazepam) Trial paxil 20 po qd Ok to take lorazepam while starting paxil  20 minute discussion--all counselling regarding anxiety

## 2011-02-16 ENCOUNTER — Telehealth: Payer: Self-pay | Admitting: *Deleted

## 2011-02-16 ENCOUNTER — Telehealth: Payer: Self-pay | Admitting: Internal Medicine

## 2011-02-16 ENCOUNTER — Other Ambulatory Visit: Payer: Self-pay | Admitting: *Deleted

## 2011-02-16 NOTE — Telephone Encounter (Signed)
I would not recommend more lorazepam I'd like him to stick with current regimen for 1 more week and call with results---as discussed it takes paroxetine "a while to work". If unable to do that refer to psychiatry

## 2011-02-16 NOTE — Telephone Encounter (Signed)
Pt called and said that LORazepam (ATIVAN) 0.5 MG tablet was not rcvd by pharmacy. Pls call in to [facility] [address].

## 2011-02-16 NOTE — Telephone Encounter (Signed)
rx called in

## 2011-02-16 NOTE — Telephone Encounter (Signed)
Pt states that paxil is not working in fact his anxiety is worse.  Wants to know if he should take more lorazepam

## 2011-02-16 NOTE — Telephone Encounter (Signed)
Left message for pt to call back  °

## 2011-02-17 NOTE — Telephone Encounter (Signed)
Pt is stressed out and anxious and saying he does not understand why Dr Cato Mulligan is not helping him.  Explained to pt that Lorazepam was called in for #30 and he can go pick it up.  Pt states that it was called in for #20 and he can't cause it is an early refill.  Called pharmacy and pts rx is ready for p/u #30, pt aware

## 2011-03-11 ENCOUNTER — Telehealth: Payer: Self-pay | Admitting: Internal Medicine

## 2011-03-11 NOTE — Telephone Encounter (Signed)
Patient is calling requesting to switch pcp from Dr. Cato Mulligan to Dr. Caryl Never. Patient previously requested to see Dr. Clent Ridges but Dr. Clent Ridges stated he is not taking new patients. Please advise.

## 2011-03-11 NOTE — Telephone Encounter (Signed)
Please advise 

## 2011-03-11 NOTE — Telephone Encounter (Signed)
OK with me.

## 2011-04-07 ENCOUNTER — Other Ambulatory Visit (INDEPENDENT_AMBULATORY_CARE_PROVIDER_SITE_OTHER): Payer: BC Managed Care – PPO

## 2011-04-07 DIAGNOSIS — Z0000 Encounter for general adult medical examination without abnormal findings: Secondary | ICD-10-CM

## 2011-04-07 LAB — CBC WITH DIFFERENTIAL/PLATELET
Basophils Absolute: 0 10*3/uL (ref 0.0–0.1)
Basophils Relative: 0.6 % (ref 0.0–3.0)
Eosinophils Absolute: 0.1 10*3/uL (ref 0.0–0.7)
Hemoglobin: 15.3 g/dL (ref 13.0–17.0)
Lymphocytes Relative: 29 % (ref 12.0–46.0)
MCHC: 33.7 g/dL (ref 30.0–36.0)
Monocytes Relative: 10.9 % (ref 3.0–12.0)
Neutro Abs: 3.7 10*3/uL (ref 1.4–7.7)
Neutrophils Relative %: 57.3 % (ref 43.0–77.0)
RBC: 4.82 Mil/uL (ref 4.22–5.81)
WBC: 6.4 10*3/uL (ref 4.5–10.5)

## 2011-04-07 LAB — POCT URINALYSIS DIPSTICK
Leukocytes, UA: NEGATIVE
Nitrite, UA: NEGATIVE
pH, UA: 6

## 2011-04-07 LAB — HEPATIC FUNCTION PANEL
AST: 34 U/L (ref 0–37)
Alkaline Phosphatase: 66 U/L (ref 39–117)
Bilirubin, Direct: 0.2 mg/dL (ref 0.0–0.3)
Total Protein: 6.6 g/dL (ref 6.0–8.3)

## 2011-04-07 LAB — BASIC METABOLIC PANEL
CO2: 27 mEq/L (ref 19–32)
Calcium: 9.2 mg/dL (ref 8.4–10.5)
Creatinine, Ser: 0.8 mg/dL (ref 0.4–1.5)
GFR: 105.55 mL/min (ref >60.00)
Sodium: 141 mEq/L (ref 135–145)

## 2011-04-07 LAB — LIPID PANEL
HDL: 41.1 mg/dL (ref >39.00)
Total CHOL/HDL Ratio: 5
VLDL: 31.6 mg/dL (ref 0.0–40.0)

## 2011-04-07 LAB — PSA: PSA: 2.29 ng/mL (ref 0.10–4.00)

## 2011-04-13 ENCOUNTER — Encounter: Payer: Self-pay | Admitting: Family Medicine

## 2011-04-13 ENCOUNTER — Ambulatory Visit (INDEPENDENT_AMBULATORY_CARE_PROVIDER_SITE_OTHER): Payer: BC Managed Care – PPO | Admitting: Family Medicine

## 2011-04-13 VITALS — BP 128/72 | HR 80 | Temp 98.1°F | Resp 12 | Ht 70.5 in | Wt 219.0 lb

## 2011-04-13 DIAGNOSIS — Z0000 Encounter for general adult medical examination without abnormal findings: Secondary | ICD-10-CM

## 2011-04-13 MED ORDER — EZETIMIBE-SIMVASTATIN 10-40 MG PO TABS: 1.0000 | ORAL_TABLET | Freq: Every day | ORAL | Status: DC

## 2011-04-13 MED ORDER — PAROXETINE HCL 30 MG PO TABS: 30.0000 mg | ORAL_TABLET | ORAL | Status: DC

## 2011-04-13 NOTE — Patient Instructions (Signed)
Try to establish regular exercise regimen and lose some weight. Reduce high glycemic foods.

## 2011-04-13 NOTE — Progress Notes (Signed)
Subjective:    Patient ID: Brandon Frank, male    DOB: February 13, 1955, 56 y.o.   MRN: [id number]  HPI  Complete physical. Rotator cuff repair recently. He is slowly recovering. Had ? vasovagal type syncope following his surgery and had anxiety since then. Has been on Paxil 20 mg daily this has helped somewhat and takes lorazepam as needed. He thinks Paxil might need to be titrated up somewhat. Overall about 70% improved-with regard to anxiety. He had some weight gain and low libido which suspects may be related to the Paxil. No exercise. Tetanus up-to-date. Colonoscopy up to date. Family history of premature CAD in his father  Past Medical History  Diagnosis Date  . Hyperlipidemia   . Chronic low back pain    Past Surgical History  Procedure Date  . Hernia repair   . Rotator cuff surgery 2012    Dr. Ranell Patrick    reports that he quit smoking about 36 years ago. He has never used smokeless tobacco. He reports that he drinks alcohol. He reports that he does not use illicit drugs. family history includes Diabetes in his brother and paternal grandfather; Heart disease in his father; Hyperlipidemia in his brother and father; and Hypertension in his brother and father. No Known Allergies   Review of Systems  Constitutional: Positive for fatigue. Negative for fever, activity change and appetite change.  HENT: Negative for ear pain, congestion and trouble swallowing.   Eyes: Negative for pain and visual disturbance.  Respiratory: Negative for cough, shortness of breath and wheezing.   Cardiovascular: Negative for chest pain and palpitations.  Gastrointestinal: Negative for nausea, vomiting, abdominal pain, diarrhea, constipation, blood in stool, abdominal distention and rectal pain.  Genitourinary: Negative for dysuria, hematuria and testicular pain.  Musculoskeletal: Negative for joint swelling and arthralgias.  Skin: Negative for rash.  Neurological: Negative for dizziness, syncope and  headaches.  Hematological: Negative for adenopathy.  Psychiatric/Behavioral: Negative for confusion and dysphoric mood. The patient is nervous/anxious.        Objective:   Physical Exam  Constitutional: He is oriented to person, place, and time. He appears well-developed and well-nourished. No distress.  HENT:  Head: Normocephalic and atraumatic.  Right Ear: External ear normal.  Left Ear: External ear normal.  Mouth/Throat: Oropharynx is clear and moist.  Eyes: Conjunctivae and EOM are normal. Pupils are equal, round, and reactive to light.  Neck: Normal range of motion. Neck supple. No thyromegaly present.  Cardiovascular: Normal rate, regular rhythm and normal heart sounds.   No murmur heard. Pulmonary/Chest: No respiratory distress. He has no wheezes. He has no rales.  Abdominal: Soft. Bowel sounds are normal. He exhibits no distension and no mass. There is no tenderness. There is no rebound and no guarding.  Musculoskeletal: He exhibits no edema.  Lymphadenopathy:    He has no cervical adenopathy.  Neurological: He is alert and oriented to person, place, and time. He displays normal reflexes. No cranial nerve deficit.  Skin: No rash noted.  Psychiatric: He has a normal mood and affect.          Assessment & Plan:  Complete physical. Labs reviewed with patient. He has glucose in diabetes range with 136 fasting. Work on weight loss. Reduce high glycemic foods. Titrate Paxil 30 mg daily for anxiety symptoms and continue as needed lorazepam. We may wish to convert over to medication less likely to cause weight gain the future such as sertraline. Reassess 3 months fasting blood sugar and A1c then

## 2011-05-03 ENCOUNTER — Other Ambulatory Visit: Payer: Self-pay | Admitting: Internal Medicine

## 2011-05-04 ENCOUNTER — Other Ambulatory Visit: Payer: Self-pay | Admitting: Family Medicine

## 2011-05-04 MED ORDER — ZOLPIDEM TARTRATE 10 MG PO TABS: 10.0000 mg | ORAL_TABLET | Freq: Every evening | ORAL | Status: DC | PRN

## 2011-05-04 NOTE — Telephone Encounter (Signed)
Pt requesting refill on zolpidem (AMBIEN) 10 MG tablet. Pt is out of medication and is hoping to get refill called in today. Pt is aware of the 72 hour policy.  [facility] [address]

## 2011-05-04 NOTE — Telephone Encounter (Signed)
Last filled 01-30-11, #30 with 2 refills at [facility]

## 2011-05-04 NOTE — Telephone Encounter (Signed)
Refill for 3 months. 

## 2011-05-04 NOTE — Telephone Encounter (Signed)
Addended by: Melchor Amour on: 05/04/2011 10:39 AM   Modules accepted: Orders

## 2011-05-22 ENCOUNTER — Telehealth: Payer: Self-pay | Admitting: *Deleted

## 2011-05-22 NOTE — Telephone Encounter (Signed)
Discontinue Paxil and start Sertraline 50 mg daily and office follow up in one month

## 2011-05-22 NOTE — Telephone Encounter (Signed)
Pt. Wanted Dr Caryl Never to know  That the increase of Paxil from 20 mg to 30 mg has not helped at all. Is asking what Dr. Caryl Never recommends next to help his depressive symptoms.

## 2011-05-23 MED ORDER — SERTRALINE HCL 50 MG PO TABS: 50.0000 mg | ORAL_TABLET | Freq: Every day | ORAL | Status: DC

## 2011-05-23 NOTE — Telephone Encounter (Signed)
Pt informed on personally identified VM, Rx sent

## 2011-06-11 ENCOUNTER — Other Ambulatory Visit: Payer: Self-pay | Admitting: Internal Medicine

## 2011-06-15 NOTE — Telephone Encounter (Signed)
Refill for 3 months. 

## 2011-06-16 ENCOUNTER — Telehealth: Payer: Self-pay | Admitting: Family Medicine

## 2011-06-16 NOTE — Telephone Encounter (Signed)
Call placed to patient at [phone], he was informed Rx sent to pharmacy, and advised to check with pharmacist for medication refill. Patient verbalized understanding and agrees.

## 2011-06-16 NOTE — Telephone Encounter (Signed)
Pt called and said that the [facility] [address] did not rcv the script for LORazepam (ATIVAN) 0.5 MG tablet. Pls be sure to call this med in to the pharmacy and let them know that Dr Caryl Never is pts pcp now.

## 2011-06-16 NOTE — Telephone Encounter (Signed)
Verbal refill provided to pharmacist.  

## 2011-07-02 ENCOUNTER — Other Ambulatory Visit (INDEPENDENT_AMBULATORY_CARE_PROVIDER_SITE_OTHER): Payer: BC Managed Care – PPO

## 2011-07-02 DIAGNOSIS — E119 Type 2 diabetes mellitus without complications: Secondary | ICD-10-CM

## 2011-07-02 LAB — BASIC METABOLIC PANEL
BUN: 18 mg/dL (ref 6–23)
CO2: 26 mEq/L (ref 19–32)
Calcium: 9 mg/dL (ref 8.4–10.5)
Creatinine, Ser: 0.8 mg/dL (ref 0.4–1.5)
Glucose, Bld: 118 mg/dL — ABNORMAL HIGH (ref 70–99)

## 2011-07-08 ENCOUNTER — Encounter: Payer: Self-pay | Admitting: Family Medicine

## 2011-07-08 ENCOUNTER — Ambulatory Visit (INDEPENDENT_AMBULATORY_CARE_PROVIDER_SITE_OTHER): Payer: BC Managed Care – PPO | Admitting: Family Medicine

## 2011-07-08 VITALS — BP 130/72 | Temp 98.6°F | Wt 215.0 lb

## 2011-07-08 DIAGNOSIS — R7309 Other abnormal glucose: Secondary | ICD-10-CM

## 2011-07-08 DIAGNOSIS — F411 Generalized anxiety disorder: Secondary | ICD-10-CM

## 2011-07-08 DIAGNOSIS — G4700 Insomnia, unspecified: Secondary | ICD-10-CM

## 2011-07-08 DIAGNOSIS — F419 Anxiety disorder, unspecified: Secondary | ICD-10-CM

## 2011-07-08 MED ORDER — SERTRALINE HCL 50 MG PO TABS: ORAL_TABLET | ORAL | Status: DC

## 2011-07-08 MED ORDER — ZOLPIDEM TARTRATE 10 MG PO TABS: 10.0000 mg | ORAL_TABLET | Freq: Every evening | ORAL | Status: DC | PRN

## 2011-07-08 NOTE — Progress Notes (Signed)
  Subjective:    Patient ID: Brandon Frank, male    DOB: 08/24/1955, 56 y.o.   MRN: [id number]  HPI  Patient is seen for medical followup. History of chronic insomnia -needs refills of Ambien. Takes 10 mg  and has done so for several years. Recently had problems with some weight gain and elevated blood sugars. He has lost about 5 pounds of weight is exercising and eating better. We changed him from Paxil to Zoloft for chronic anxiety and symptoms have been stable. Recent labs indicate A1c 6.0% with fasting glucose 118 which is down from 136 previously.  He still has some daily anxiety. About the same as previous. He would like to consider titrating Zoloft. Still takes Ativan generally once daily. Sleeping fairly well with Ambien. Denies increased depressive symptoms.  Past Medical History  Diagnosis Date  . Hyperlipidemia   . Chronic low back pain    Past Surgical History  Procedure Date  . Hernia repair   . Rotator cuff surgery 2012    Dr. Ranell Patrick    reports that he quit smoking about 36 years ago. He has never used smokeless tobacco. He reports that he drinks alcohol. He reports that he does not use illicit drugs. family history includes Diabetes in his brother and paternal grandfather; Heart disease in his father; Hyperlipidemia in his brother and father; and Hypertension in his brother and father. No Known Allergies    Review of Systems  Constitutional: Negative for fatigue.  Eyes: Negative for visual disturbance.  Respiratory: Negative for cough, chest tightness and shortness of breath.   Cardiovascular: Negative for chest pain, palpitations and leg swelling.  Neurological: Negative for dizziness, syncope, weakness, light-headedness and headaches.  Psychiatric/Behavioral: Positive for disturbed wake/sleep cycle. Negative for suicidal ideas and dysphoric mood.       Objective:   Physical Exam  Constitutional: He is oriented to person, place, and time. He appears  well-developed and well-nourished.  Neck: Neck supple. No thyromegaly present.  Cardiovascular: Normal rate and regular rhythm.   No murmur heard. Pulmonary/Chest: Effort normal and breath sounds normal. No respiratory distress. He has no wheezes. He has no rales.  Musculoskeletal: He exhibits no edema.  Lymphadenopathy:    He has no cervical adenopathy.  Neurological: He is alert and oriented to person, place, and time. No cranial nerve deficit.  Psychiatric: He has a normal mood and affect. His behavior is normal.          Assessment & Plan:  #1 hyperglycemia. Previously glucose diabetes range. Improved with weight loss. Continue weight loss and exercise efforts. Diet discussed. Consider repeat A1c in 6 months. He'll check fasting blood sugar at least every other week. #2 chronic insomnia. Refill Ambien for 6 months #3 chronic anxiety. Titrate sertraline 50 mg one and one half tablet daily

## 2011-10-01 ENCOUNTER — Encounter: Payer: Self-pay | Admitting: Family Medicine

## 2011-10-01 ENCOUNTER — Ambulatory Visit (INDEPENDENT_AMBULATORY_CARE_PROVIDER_SITE_OTHER): Payer: BC Managed Care – PPO | Admitting: Family Medicine

## 2011-10-01 VITALS — BP 124/78 | Temp 98.4°F | Wt 215.0 lb

## 2011-10-01 DIAGNOSIS — R1032 Left lower quadrant pain: Secondary | ICD-10-CM

## 2011-10-01 DIAGNOSIS — H9202 Otalgia, left ear: Secondary | ICD-10-CM

## 2011-10-01 DIAGNOSIS — H9209 Otalgia, unspecified ear: Secondary | ICD-10-CM

## 2011-10-01 LAB — CBC WITH DIFFERENTIAL/PLATELET
Basophils Absolute: 0 10*3/uL (ref 0.0–0.1)
Eosinophils Absolute: 0.1 10*3/uL (ref 0.0–0.7)
Lymphocytes Relative: 13.9 % (ref 12.0–46.0)
MCHC: 33.1 g/dL (ref 30.0–36.0)
Neutro Abs: 8.7 10*3/uL — ABNORMAL HIGH (ref 1.4–7.7)
Neutrophils Relative %: 72.8 % (ref 43.0–77.0)
Platelets: 170 10*3/uL (ref 150.0–400.0)
RDW: 12.9 % (ref 11.5–14.6)

## 2011-10-01 MED ORDER — CIPROFLOXACIN HCL 500 MG PO TABS: 500.0000 mg | ORAL_TABLET | Freq: Two times a day (BID) | ORAL | Status: AC

## 2011-10-01 MED ORDER — METRONIDAZOLE 500 MG PO TABS: 500.0000 mg | ORAL_TABLET | Freq: Three times a day (TID) | ORAL | Status: AC

## 2011-10-01 NOTE — Progress Notes (Signed)
Quick Note:  Pt informed ______ 

## 2011-10-01 NOTE — Patient Instructions (Addendum)
Diverticulitis A diverticulum is a small pouch or sac on the colon. Diverticulosis is the presence of these diverticula on the colon. Diverticulitis is the irritation (inflammation) or infection of diverticula. CAUSES  The colon and its diverticula contain bacteria. If food particles block the tiny opening to a diverticulum, the bacteria inside can grow and cause an increase in pressure. This leads to infection and inflammation and is called diverticulitis. SYMPTOMS   Abdominal pain and tenderness. Usually, the pain is located on the left side of your abdomen. However, it could be located elsewhere.   Fever.   Bloating.   Feeling sick to your stomach (nausea).   Throwing up (vomiting).   Abnormal stools.  DIAGNOSIS  Your caregiver will take a history and perform a physical exam. Since many things can cause abdominal pain, other tests may be necessary. Tests may include:  Blood tests.   Urine tests.   X-ray of the abdomen.   CT scan of the abdomen.  Sometimes, surgery is needed to determine if diverticulitis or other conditions are causing your symptoms. TREATMENT  Most of the time, you can be treated without surgery. Treatment includes:  Resting the bowels by only having liquids for a few days. As you improve, you will need to eat a low-fiber diet.   Intravenous (IV) fluids if you are losing body fluids (dehydrated).   Antibiotic medicines that treat infections may be given.   Pain and nausea medicine, if needed.   Surgery if the inflamed diverticulum has burst.  HOME CARE INSTRUCTIONS   Try a clear liquid diet (broth, tea, or water for as long as directed by your caregiver). You may then gradually begin a low-fiber diet as tolerated. A low-fiber diet is a diet with less than 10 grams of fiber. Choose the foods below to reduce fiber in the diet:   White breads, cereals, rice, and pasta.   Cooked fruits and vegetables or soft fresh fruits and vegetables without the skin.     Ground or well-cooked tender beef, ham, veal, lamb, pork, or poultry.   Eggs and seafood.   After your diverticulitis symptoms have improved, your caregiver may put you on a high-fiber diet. A high-fiber diet includes 14 grams of fiber for every 1000 calories consumed. For a standard 2000 calorie diet, you would need 28 grams of fiber. Follow these diet guidelines to help you increase the fiber in your diet. It is important to slowly increase the amount fiber in your diet to avoid gas, constipation, and bloating.   Choose whole-grain breads, cereals, pasta, and brown rice.   Choose fresh fruits and vegetables with the skin on. Do not overcook vegetables because the more vegetables are cooked, the more fiber is lost.   Choose more nuts, seeds, legumes, dried peas, beans, and lentils.   Look for food products that have greater than 3 grams of fiber per serving on the Nutrition Facts label.   Take all medicine as directed by your caregiver.   If your caregiver has given you a follow-up appointment, it is very important that you go. Not going could result in lasting (chronic) or permanent injury, pain, and disability. If there is any problem keeping the appointment, call to reschedule.  SEEK MEDICAL CARE IF:   Your pain does not improve.   You have a hard time advancing your diet beyond clear liquids.   Your bowel movements do not return to normal.  SEEK IMMEDIATE MEDICAL CARE IF:   Your pain becomes   worse.   You have an oral temperature above 102 F (38.9 C), not controlled by medicine.   You have repeated vomiting.   You have bloody or black, tarry stools.   Symptoms that brought you to your caregiver become worse or are not getting better.  MAKE SURE YOU:   Understand these instructions.   Will watch your condition.   Will get help right away if you are not doing well or get worse.  Document Released: 10/15/2004 Document Revised: 12/25/2010 Document Reviewed:  02/10/2010 [employer] Patient Information 2012 [employer]. 

## 2011-10-01 NOTE — Progress Notes (Signed)
  Subjective:    Patient ID: Brandon Frank, male    DOB: 1955-07-27, 56 y.o.   MRN: [id number]  HPI  Patient seen with abdominal pain left lower quadrant. Onset about 3 days ago. He has known history of diverticulosis from previous colonoscopy. No history of diverticulitis. His pain is achy quality left lower quadrant without radiation. Mild severity. Low-grade fever past couple days. Some malaise and generalized body aches. No chest pains. Denies nausea or vomiting. No recent stool changes. Appetite fair. No alleviating factors. No aggravating factors.  Patient also has had a couple days of left ear pain. Again no chest pain. No dizziness. No nasal congestion.  Past Medical History  Diagnosis Date  . Hyperlipidemia   . Chronic low back pain    Past Surgical History  Procedure Date  . Hernia repair   . Rotator cuff surgery 2012    Dr. Ranell Patrick    reports that he quit smoking about 36 years ago. He has never used smokeless tobacco. He reports that he drinks alcohol. He reports that he does not use illicit drugs. family history includes Diabetes in his brother and paternal grandfather; Heart disease in his father; Hyperlipidemia in his brother and father; and Hypertension in his brother and father. No Known Allergies    Review of Systems  Constitutional: Positive for fever, chills and fatigue.  Respiratory: Negative for cough and shortness of breath.   Cardiovascular: Negative for chest pain.  Gastrointestinal: Positive for abdominal pain. Negative for nausea, vomiting, diarrhea and constipation.  Genitourinary: Negative for dysuria and hematuria.       Objective:   Physical Exam  Constitutional: He appears well-developed and well-nourished.  HENT:  Right Ear: External ear normal.  Left Ear: External ear normal.  Mouth/Throat: Oropharynx is clear and moist.  Neck: Neck supple.  Cardiovascular: Normal rate and regular rhythm.   Pulmonary/Chest: Effort normal and breath sounds  normal. No respiratory distress. He has no wheezes. He has no rales.  Abdominal: Soft. Bowel sounds are normal. He exhibits no distension and no mass. There is tenderness. There is no guarding.       Patient has some mild tenderness left lower quadrant to deep palpation.  Musculoskeletal: He exhibits no edema.  Lymphadenopathy:    He has no cervical adenopathy.          Assessment & Plan:  #1 left lower quadrant abdominal pains. Suspect acute diverticulitis. Check CBC. Start Flagyl 500 mg 3 times a day for 10 days and Cipro 500 mg twice a day for 10 days. Followup promptly for any vomiting, increased fever, or worsening pain  #2 left otalgia. Normal exam. Reassurance

## 2011-10-16 ENCOUNTER — Other Ambulatory Visit: Payer: Self-pay | Admitting: Family Medicine

## 2011-10-19 NOTE — Telephone Encounter (Signed)
I spoke with pt about combining the lorazepam and Ambien.  He stated hs was just in and they discussed how he takes it and was assured that Dr Caryl Never would refill for 6 months.  Pt takes lorazepam during the day as needed and is trying to wean off the sertraline, down to 1/2  In the AM due to side effects.  Pt takes Ambien every evening at bedtime for sleep.  Pt states it's been a hassel filling the lorazepam and was reassured by Dr Caryl Never he would remember to authorize 6 months refills at OV.

## 2011-10-19 NOTE — Telephone Encounter (Signed)
Last OV was Sept 2012, cancelled a 3 month F/U Lorazepam 0.5 last filled 06-11-11, #30 with 3 refills

## 2011-10-19 NOTE — Telephone Encounter (Signed)
Refill for 6 months (lorazepam).

## 2011-10-19 NOTE — Telephone Encounter (Signed)
Looks like he was here earlier this month.  He should not be combining this with Ambien.  Refill once but recommend against regular use.

## 2011-10-20 ENCOUNTER — Other Ambulatory Visit: Payer: Self-pay | Admitting: Family Medicine

## 2011-10-20 MED ORDER — LORAZEPAM 0.5 MG PO TABS: 0.5000 mg | ORAL_TABLET | ORAL | Status: DC | PRN

## 2011-10-20 NOTE — Addendum Note (Signed)
Addended by: Melchor Amour on: 10/20/2011 09:39 AM   Modules accepted: Orders

## 2011-10-20 NOTE — Telephone Encounter (Signed)
Pt informed of additional refills and he was appreciative

## 2011-11-27 ENCOUNTER — Ambulatory Visit (INDEPENDENT_AMBULATORY_CARE_PROVIDER_SITE_OTHER): Payer: BC Managed Care – PPO | Admitting: Family Medicine

## 2011-11-27 DIAGNOSIS — Z23 Encounter for immunization: Secondary | ICD-10-CM

## 2012-01-07 ENCOUNTER — Ambulatory Visit: Payer: BC Managed Care – PPO | Admitting: Family Medicine

## 2012-01-08 ENCOUNTER — Encounter: Payer: Self-pay | Admitting: Family Medicine

## 2012-01-08 ENCOUNTER — Ambulatory Visit (INDEPENDENT_AMBULATORY_CARE_PROVIDER_SITE_OTHER): Payer: BC Managed Care – PPO | Admitting: Family Medicine

## 2012-01-08 VITALS — BP 120/90 | Temp 98.2°F | Wt 223.0 lb

## 2012-01-08 DIAGNOSIS — R7303 Prediabetes: Secondary | ICD-10-CM

## 2012-01-08 DIAGNOSIS — R5383 Other fatigue: Secondary | ICD-10-CM

## 2012-01-08 DIAGNOSIS — F411 Generalized anxiety disorder: Secondary | ICD-10-CM

## 2012-01-08 DIAGNOSIS — R7309 Other abnormal glucose: Secondary | ICD-10-CM

## 2012-01-08 DIAGNOSIS — R6882 Decreased libido: Secondary | ICD-10-CM

## 2012-01-08 DIAGNOSIS — F419 Anxiety disorder, unspecified: Secondary | ICD-10-CM

## 2012-01-08 DIAGNOSIS — R5381 Other malaise: Secondary | ICD-10-CM

## 2012-01-08 LAB — BASIC METABOLIC PANEL
Chloride: 103 mEq/L (ref 96–112)
Creatinine, Ser: 0.9 mg/dL (ref 0.4–1.5)
Potassium: 4.5 mEq/L (ref 3.5–5.1)
Sodium: 138 mEq/L (ref 135–145)

## 2012-01-08 LAB — HEMOGLOBIN A1C: Hgb A1c MFr Bld: 7 % — ABNORMAL HIGH (ref 4.6–6.5)

## 2012-01-08 NOTE — Progress Notes (Signed)
  Subjective:    Patient ID: Brandon Frank, male    DOB: August 27, 1955, 56 y.o.   MRN: [id number]  HPI  Patient seen for several issues  Brother recently diagnosed with what sounds like DVT. Question of factor V Leiden deficiency. Patient has never had blood clots. Nonsmoker. Also some question of whether mom had some type of coagulopathy.  Low libido. Patient attributed this to sertraline and has been off this for couple weeks has not seen much improvement. He is taking sertraline for anxiety disorder and this did help somewhat suppression anxiety symptoms but has had low libido and thought this was related and stopped.  Long history of chronic anxiety. Takes occasional lorazepam and we've previously explained rationale for not taking this regularly.  History of prediabetes. He started exercising more regularly. He has low stamina and frequent fatigue. Previous A1c 6.0%  Review of Systems  Constitutional: Positive for fatigue.  Eyes: Negative for visual disturbance.  Respiratory: Negative for cough, chest tightness and shortness of breath.   Cardiovascular: Negative for chest pain, palpitations and leg swelling.  Neurological: Negative for dizziness, syncope, weakness, light-headedness and headaches.  Psychiatric/Behavioral: The patient is nervous/anxious.        Objective:   Physical Exam  Constitutional: He is oriented to person, place, and time. He appears well-developed and well-nourished.  Neck: Neck supple. No thyromegaly present.  Cardiovascular: Normal rate and regular rhythm.   Pulmonary/Chest: Effort normal and breath sounds normal. No respiratory distress. He has no wheezes. He has no rales.  Musculoskeletal: He exhibits no edema.  Neurological: He is alert and oriented to person, place, and time.          Assessment & Plan:  #1 family history of coagulopathy with question of factor V Leiden deficiency. We explained he could be tested for this but this would not  change our treatment. Since he's never had clots himself we've recommended measures to reduce risk of blood clots-eg frequent stretching with long flights. #2 history prediabetes. Recheck A1c and fasting blood sugar today  #3 low libido. He also has symptoms of increased fatigue and decreased stamina. Check free total testosterone level #4 chronic anxiety. Patient is weaned off of sertraline. Avoid regular use of lorazepam

## 2012-01-10 ENCOUNTER — Other Ambulatory Visit: Payer: Self-pay | Admitting: Family Medicine

## 2012-01-11 ENCOUNTER — Other Ambulatory Visit: Payer: Self-pay | Admitting: Family Medicine

## 2012-01-11 DIAGNOSIS — R7309 Other abnormal glucose: Secondary | ICD-10-CM

## 2012-01-11 LAB — TESTOSTERONE, FREE, TOTAL, SHBG
Testosterone-% Free: 2 % (ref 1.6–2.9)
Testosterone: 303.17 ng/dL (ref 300–890)

## 2012-01-11 MED ORDER — METFORMIN HCL 500 MG PO TABS: 500.0000 mg | ORAL_TABLET | Freq: Every day | ORAL | Status: DC

## 2012-01-11 NOTE — Telephone Encounter (Signed)
Pt was just in for OV 01/08/11, Ambien last filled 07-08-11, #30 with 3 refills

## 2012-01-11 NOTE — Progress Notes (Signed)
Quick Note:  Pt informed on personally identified VM ______ 

## 2012-01-11 NOTE — Telephone Encounter (Signed)
Refill times three. 

## 2012-01-12 ENCOUNTER — Other Ambulatory Visit: Payer: Self-pay | Admitting: Family Medicine

## 2012-01-12 ENCOUNTER — Telehealth: Payer: Self-pay | Admitting: Family Medicine

## 2012-01-12 ENCOUNTER — Telehealth: Payer: Self-pay | Admitting: *Deleted

## 2012-01-12 NOTE — Telephone Encounter (Signed)
Pt stated his testosterone level is low requesting to added testosterone level to [facility] labs on 04-06-2012. Can I sch?

## 2012-01-12 NOTE — Telephone Encounter (Signed)
Pt calling requesting additional discussion re: low end of testosterone range; he wants to do what ever it takes to raise his levels to at least mid range.

## 2012-01-12 NOTE — Telephone Encounter (Signed)
Yes

## 2012-01-12 NOTE — Telephone Encounter (Signed)
Pt informed, he had multiple questions and concerns.  I encouraged pt to schedule [facility] with Dr Caryl Never.  He voiced understanding

## 2012-01-12 NOTE — Telephone Encounter (Signed)
Pt is aware.  

## 2012-01-12 NOTE — Telephone Encounter (Signed)
Exercise and weight loss. And consider repeat in about 6 months.

## 2012-02-08 ENCOUNTER — Telehealth: Payer: Self-pay | Admitting: Family Medicine

## 2012-02-08 NOTE — Telephone Encounter (Signed)
Pt just wants to notify you that he has started to take his SERTRALINE again - one pill once daily (50mg ).

## 2012-02-08 NOTE — Telephone Encounter (Signed)
FYI, our records show he used to take 1 and 1/2 daily. Last OV 01/08/12

## 2012-04-04 ENCOUNTER — Other Ambulatory Visit: Payer: Self-pay | Admitting: Family Medicine

## 2012-04-04 ENCOUNTER — Other Ambulatory Visit: Payer: Self-pay | Admitting: Internal Medicine

## 2012-04-04 NOTE — Telephone Encounter (Signed)
Refill OK

## 2012-04-04 NOTE — Telephone Encounter (Signed)
Ambien PRN at [facility] last filled 01-12-12, #30 with 2 refills Pt has CPE scheduled for 04-14-12

## 2012-04-06 ENCOUNTER — Other Ambulatory Visit (INDEPENDENT_AMBULATORY_CARE_PROVIDER_SITE_OTHER): Payer: BC Managed Care – PPO

## 2012-04-06 DIAGNOSIS — Z0000 Encounter for general adult medical examination without abnormal findings: Secondary | ICD-10-CM

## 2012-04-06 DIAGNOSIS — R7309 Other abnormal glucose: Secondary | ICD-10-CM

## 2012-04-06 LAB — BASIC METABOLIC PANEL
BUN: 17 mg/dL (ref 6–23)
CO2: 29 mEq/L (ref 19–32)
Chloride: 102 mEq/L (ref 96–112)
Creatinine, Ser: 0.9 mg/dL (ref 0.4–1.5)

## 2012-04-06 LAB — CBC WITH DIFFERENTIAL/PLATELET
Basophils Relative: 0.6 % (ref 0.0–3.0)
Eosinophils Relative: 1.8 % (ref 0.0–5.0)
HCT: 46.1 % (ref 39.0–52.0)
Hemoglobin: 15.4 g/dL (ref 13.0–17.0)
Lymphs Abs: 1.4 10*3/uL (ref 0.7–4.0)
MCV: 93.2 fl (ref 78.0–100.0)
Monocytes Absolute: 0.7 10*3/uL (ref 0.1–1.0)
Monocytes Relative: 12.4 % — ABNORMAL HIGH (ref 3.0–12.0)
RBC: 4.95 Mil/uL (ref 4.22–5.81)
WBC: 5.9 10*3/uL (ref 4.5–10.5)

## 2012-04-06 LAB — POCT URINALYSIS DIPSTICK
Bilirubin, UA: NEGATIVE
Leukocytes, UA: NEGATIVE
Nitrite, UA: NEGATIVE
pH, UA: 5.5

## 2012-04-06 LAB — TSH: TSH: 1.78 u[IU]/mL (ref 0.35–5.50)

## 2012-04-06 LAB — HEPATIC FUNCTION PANEL
ALT: 58 U/L — ABNORMAL HIGH (ref 0–53)
AST: 39 U/L — ABNORMAL HIGH (ref 0–37)
Bilirubin, Direct: 0.2 mg/dL (ref 0.0–0.3)
Total Protein: 7 g/dL (ref 6.0–8.3)

## 2012-04-06 LAB — LIPID PANEL
LDL Cholesterol: 122 mg/dL — ABNORMAL HIGH (ref 0–99)
VLDL: 27.8 mg/dL (ref 0.0–40.0)

## 2012-04-06 LAB — TESTOSTERONE: Testosterone: 524.25 ng/dL (ref 350.00–890.00)

## 2012-04-06 LAB — MICROALBUMIN / CREATININE URINE RATIO: Microalb Creat Ratio: 0.5 mg/g (ref 0.0–30.0)

## 2012-04-14 ENCOUNTER — Ambulatory Visit (INDEPENDENT_AMBULATORY_CARE_PROVIDER_SITE_OTHER): Payer: BC Managed Care – PPO | Admitting: Family Medicine

## 2012-04-14 ENCOUNTER — Encounter: Payer: Self-pay | Admitting: Family Medicine

## 2012-04-14 VITALS — BP 140/80 | HR 72 | Temp 98.4°F | Resp 12 | Ht 70.0 in | Wt 219.0 lb

## 2012-04-14 DIAGNOSIS — E1165 Type 2 diabetes mellitus with hyperglycemia: Secondary | ICD-10-CM

## 2012-04-14 DIAGNOSIS — IMO0002 Reserved for concepts with insufficient information to code with codable children: Secondary | ICD-10-CM | POA: Insufficient documentation

## 2012-04-14 DIAGNOSIS — R972 Elevated prostate specific antigen [PSA]: Secondary | ICD-10-CM

## 2012-04-14 DIAGNOSIS — E119 Type 2 diabetes mellitus without complications: Secondary | ICD-10-CM

## 2012-04-14 DIAGNOSIS — Z0000 Encounter for general adult medical examination without abnormal findings: Secondary | ICD-10-CM

## 2012-04-14 HISTORY — DX: Type 2 diabetes mellitus with hyperglycemia: E11.65

## 2012-04-14 HISTORY — DX: Reserved for concepts with insufficient information to code with codable children: IMO0002

## 2012-04-14 MED ORDER — EZETIMIBE-SIMVASTATIN 10-40 MG PO TABS: 1.0000 | ORAL_TABLET | Freq: Every day | ORAL | Status: DC

## 2012-04-14 NOTE — Progress Notes (Signed)
  Subjective:    Patient ID: Brandon Frank, male    DOB: 1955-06-25, 57 y.o.   MRN: [id number]  HPI Pt here for CPE. Pt has hx Type 2 diabetes, hyperlipidemia, insomnia, mild transaminasemia. Hypogonadism and recently started per psychiatrist on Androgel.   Blood sugars stable. Exercising at least few times per week.  Past Medical History  Diagnosis Date  . Hyperlipidemia   . Chronic low back pain    Past Surgical History  Procedure Laterality Date  . Hernia repair    . Rotator cuff surgery  2012    Dr. Ranell Patrick    reports that he quit smoking about 37 years ago. He has never used smokeless tobacco. He reports that  drinks alcohol. He reports that he does not use illicit drugs. family history includes Diabetes in his brother and paternal grandfather; Heart disease (age of onset: 17) in his father; Hyperlipidemia in his brother and father; and Hypertension in his brother and father. No Known Allergies    Review of Systems  Constitutional: Negative for fever, activity change, appetite change, fatigue and unexpected weight change.  HENT: Negative for ear pain, congestion and trouble swallowing.   Eyes: Negative for pain and visual disturbance.  Respiratory: Negative for cough, shortness of breath and wheezing.   Cardiovascular: Negative for chest pain and palpitations.  Gastrointestinal: Negative for nausea, vomiting, abdominal pain, diarrhea, constipation, blood in stool, abdominal distention and rectal pain.  Genitourinary: Negative for dysuria, hematuria and testicular pain.  Musculoskeletal: Negative for joint swelling and arthralgias.  Skin: Negative for rash.  Neurological: Negative for dizziness, syncope and headaches.  Hematological: Negative for adenopathy.  Psychiatric/Behavioral: Negative for confusion and dysphoric mood.       Objective:   Physical Exam  Constitutional: He is oriented to person, place, and time. He appears well-developed and well-nourished. No  distress.  HENT:  Head: Normocephalic and atraumatic.  Right Ear: External ear normal.  Left Ear: External ear normal.  Mouth/Throat: Oropharynx is clear and moist.  Eyes: Conjunctivae and EOM are normal. Pupils are equal, round, and reactive to light.  Neck: Normal range of motion. Neck supple. No thyromegaly present.  Cardiovascular: Normal rate, regular rhythm and normal heart sounds.   No murmur heard. Pulmonary/Chest: No respiratory distress. He has no wheezes. He has no rales.  Abdominal: Soft. Bowel sounds are normal. He exhibits no distension and no mass. There is no tenderness. There is no rebound and no guarding.  Genitourinary: Rectum normal and prostate normal.  Musculoskeletal: He exhibits no edema.  Lymphadenopathy:    He has no cervical adenopathy.  Neurological: He is alert and oriented to person, place, and time. He displays normal reflexes. No cranial nerve deficit.  Skin: No rash noted.  Psychiatric: He has a normal mood and affect.          Assessment & Plan:  CPE.  Labs reviewed.  A1C improved on metformin.  Colonoscopy up to date.  Continue with regular, consistent exercise. Tetanus up to date.  PSA still normal range but over 1 point increase from last year.  Repeat PSA in 6 months.

## 2012-04-14 NOTE — Patient Instructions (Addendum)
Let's plan follow up PSA in 6 months.

## 2012-07-06 ENCOUNTER — Ambulatory Visit (INDEPENDENT_AMBULATORY_CARE_PROVIDER_SITE_OTHER): Payer: BC Managed Care – PPO | Admitting: Family Medicine

## 2012-07-06 ENCOUNTER — Encounter: Payer: Self-pay | Admitting: Family Medicine

## 2012-07-06 VITALS — BP 120/80 | Temp 98.1°F | Wt 217.0 lb

## 2012-07-06 DIAGNOSIS — M79671 Pain in right foot: Secondary | ICD-10-CM

## 2012-07-06 DIAGNOSIS — M79609 Pain in unspecified limb: Secondary | ICD-10-CM

## 2012-07-06 NOTE — Patient Instructions (Signed)
-  get xrays today - we will contact your once radiologist looks at them with results  -if xrays ok, advise ice, [place] sock, light activity as tolerated, alphabet exercises  -tylenol or ibuprofen as needed for pain per instructions on bottle  -follow up in 2-3 weeks

## 2012-07-06 NOTE — Progress Notes (Signed)
Chief Complaint  Patient presents with  . Foot Injury    right foot    HPI:  Acute visit for foot injury: -was playing soccer last night and noticed R heel pain -with one particular stomp while running felt painful sensation in bottom of R heel -has been able to walk on front of foot- but not on heel since -pain is sharp, severe pain in R heel whenever tries to bear weight -denies: fevers, malaise, recent weight loss, weakness or numbness  -no hx of trauma or surgery on this foot in the past, but has suffered from plantar fasiitis on and of and has seen podiatry and was given inserts in the past ROS: See pertinent positives and negatives per HPI.  Past Medical History  Diagnosis Date  . Hyperlipidemia   . Chronic low back pain     Family History  Problem Relation Age of Onset  . Heart disease Father 4    MI  . Hyperlipidemia Father   . Hypertension Father   . Diabetes Brother   . Hypertension Brother   . Hyperlipidemia Brother   . Diabetes Paternal Grandfather     History   Social History  . Marital Status: Married    Spouse Name: N/A    Number of Children: N/A  . Years of Education: N/A   Social History Main Topics  . Smoking status: Former Smoker    Quit date: 03/20/1975  . Smokeless tobacco: Never Used  . Alcohol Use: Yes  . Drug Use: No  . Sexually Active: None   Other Topics Concern  . None   Social History Narrative  . None    Current outpatient prescriptions:ANDROGEL PUMP 20.25 MG/ACT (1.62%) GEL, 1 pump to both upper arm area twice daily (total 4 pumps daily) per Dr Tiajuana Amass, Disp: , Rfl: ;  clobetasol (TEMOVATE) 0.05 % cream, Apply 1 application topically 2 (two) times daily.  , Disp: , Rfl: ;  DULoxetine (CYMBALTA) 60 MG capsule, Take 120 mg by mouth daily. Per Dr Tomasa Rand, Disp: , Rfl:  ezetimibe-simvastatin (VYTORIN) 10-40 MG per tablet, Take 1 tablet by mouth at bedtime., Disp: 90 tablet, Rfl: 3;  fish oil-omega-3 fatty acids 1000 MG  capsule, Take 1 g by mouth daily. , Disp: , Rfl: ;  fluticasone (FLONASE) 50 MCG/ACT nasal spray, USE 1 SPRAY INTO THE NOSE DAILY, Disp: 16 g, Rfl: 5;  glucosamine-chondroitin 500-400 MG tablet, Take 1 tablet by mouth daily. , Disp: , Rfl:  hydrOXYzine (ATARAX) 10 MG tablet, Take 10 mg by mouth daily. Take 2, Disp: , Rfl: ;  LORazepam (ATIVAN) 0.5 MG tablet, Take 1 tablet (0.5 mg total) by mouth as needed for anxiety., Disp: 30 tablet, Rfl: 5;  metFORMIN (GLUCOPHAGE) 500 MG tablet, Take 1 tablet (500 mg total) by mouth daily with breakfast., Disp: 90 tablet, Rfl: 3;  Multiple Vitamin (MULTIVITAMIN) tablet, Take 1 tablet by mouth daily.  , Disp: , Rfl:  Psyllium (METAMUCIL) 30.9 % POWD, Take 1 packet by mouth daily.  , Disp: , Rfl: ;  saw palmetto 160 MG capsule, Take 160 mg by mouth 2 (two) times daily.  , Disp: , Rfl: ;  zolpidem (AMBIEN) 10 MG tablet, TAKE ONE TABLET BY MOUTH AT BEDTIME AS NEEDED FOR SLEEP, Disp: 30 tablet, Rfl: 2;  [DISCONTINUED] paroxetine mesylate (PEXEVA) 20 MG tablet, Take 1 tablet (20 mg total) by mouth every morning., Disp: 60 tablet, Rfl: 3  EXAM:  Filed Vitals:   07/06/12 1040  BP:  120/80  Temp: 98.1 F (36.7 C)    Body mass index is 31.14 kg/(m^2).  GENERAL: vitals reviewed and listed above, alert, oriented, appears well hydrated and in no acute distress  HEENT: atraumatic, conjunttiva clear, no obvious abnormalities on inspection of external nose and ears  CV: HRRR, no peripheral edema  MS: antalgic gait - exam of both feet reveals no swelling or bruising or deformity on inspection, he has tenderness in plantar fascia and plantar heal, no other TTP or bony TTP elsewhere, neg talar tilt, neg calf squeeze, neg ant/post drawer, NV intact distally   PSYCH: pleasant and cooperative, no obvious depression or anxiety  ASSESSMENT AND PLAN:  Discussed the following assessment and plan:  Right foot pain - Plan: DG Foot Complete Right, DG Ankle Complete Right  -query  recurrence of his plantar fasciitis, but given degree of pain plain films to exclude stress fx, etc, if neg recs per below and follow up/return precuations -Patient advised to return or notify a doctor immediately if symptoms worsen or persist or new concerns arise.  Patient Instructions  -get xrays today - we will contact your once radiologist looks at them with results  -if xrays ok, advise ice, [place] sock, light activity as tolerated, alphabet exercises  -tylenol or ibuprofen as needed for pain per instructions on bottle  -follow up in 2-3 weeks      Tj Kitchings R.

## 2012-07-07 ENCOUNTER — Ambulatory Visit (INDEPENDENT_AMBULATORY_CARE_PROVIDER_SITE_OTHER)
Admission: RE | Admit: 2012-07-07 | Discharge: 2012-07-07 | Disposition: A | Discharge status: Home / Self Care | Payer: BC Managed Care – PPO | Source: Ambulatory Visit | Attending: Family Medicine | Admitting: Family Medicine

## 2012-07-07 DIAGNOSIS — M79671 Pain in right foot: Secondary | ICD-10-CM

## 2012-07-07 DIAGNOSIS — M79609 Pain in unspecified limb: Secondary | ICD-10-CM

## 2012-07-07 NOTE — Progress Notes (Signed)
Quick Note:  Left a message for pt to return call. ______ 

## 2012-07-07 NOTE — Progress Notes (Signed)
Quick Note:  Called and spoke with pt and pt is aware. ______ 

## 2012-07-26 ENCOUNTER — Ambulatory Visit: Payer: BC Managed Care – PPO | Admitting: Family Medicine

## 2012-07-27 ENCOUNTER — Ambulatory Visit: Payer: BC Managed Care – PPO | Admitting: Family Medicine

## 2012-10-17 ENCOUNTER — Other Ambulatory Visit (INDEPENDENT_AMBULATORY_CARE_PROVIDER_SITE_OTHER): Payer: BC Managed Care – PPO

## 2012-10-17 DIAGNOSIS — R972 Elevated prostate specific antigen [PSA]: Secondary | ICD-10-CM

## 2012-10-17 DIAGNOSIS — E119 Type 2 diabetes mellitus without complications: Secondary | ICD-10-CM

## 2012-10-21 ENCOUNTER — Encounter: Payer: Self-pay | Admitting: Podiatrist

## 2012-10-21 ENCOUNTER — Ambulatory Visit (INDEPENDENT_AMBULATORY_CARE_PROVIDER_SITE_OTHER): Payer: BC Managed Care – PPO | Admitting: Podiatrist

## 2012-10-21 VITALS — BP 154/96 | HR 84 | Resp 20

## 2012-10-21 DIAGNOSIS — M722 Plantar fascial fibromatosis: Secondary | ICD-10-CM

## 2012-10-21 HISTORY — DX: Plantar fascial fibromatosis: M72.2

## 2012-10-21 MED ORDER — TRIAMCINOLONE ACETONIDE 40 MG/ML IJ SUSP
20.0000 mg | Freq: Once | INTRAMUSCULAR | Status: AC
  Administered 2012-10-21: 20 mg

## 2012-10-21 MED ORDER — MELOXICAM 15 MG PO TABS: 15.0000 mg | ORAL_TABLET | Freq: Every day | ORAL | Status: DC

## 2012-10-21 NOTE — Patient Instructions (Signed)
Ice,stretch, and utilize antiinflammatory medications for 2 weeks then wean off of medication.  Use inserts as much as comfortable.  Do not go barefoot  Plantar Fasciitis (Heel Spur Syndrome) with Rehab The plantar fascia is a fibrous, ligament-like, soft-tissue structure that spans the bottom of the foot. Plantar fasciitis is a condition that causes pain in the foot due to inflammation of the tissue. SYMPTOMS   Pain and tenderness on the underneath side of the foot.  Pain that worsens with standing or walking. CAUSES  Plantar fasciitis is caused by irritation and injury to the plantar fascia on the underneath side of the foot. Common mechanisms of injury include:  Direct trauma to bottom of the foot.  Damage to a small nerve that runs under the foot where the main fascia attaches to the heel bone.  Stress placed on the plantar fascia due to bone spurs. RISK INCREASES WITH:   Activities that place stress on the plantar fascia (running, jumping, pivoting, or cutting).  Poor strength and flexibility.  Improperly fitted shoes.  Tight calf muscles.  Flat feet.  Failure to warm-up properly before activity.  Obesity. PREVENTION  Warm up and stretch properly before activity.  Allow for adequate recovery between workouts.  Maintain physical fitness:  Strength, flexibility, and endurance.  Cardiovascular fitness.  Maintain a health body weight.  Avoid stress on the plantar fascia.  Wear properly fitted shoes, including arch supports for individuals who have flat feet. PROGNOSIS  If treated properly, then the symptoms of plantar fasciitis usually resolve without surgery. However, occasionally surgery is necessary. RELATED COMPLICATIONS   Recurrent symptoms that may result in a chronic condition.  Problems of the lower back that are caused by compensating for the injury, such as limping.  Pain or weakness of the foot during push-off following surgery.  Chronic  inflammation, scarring, and partial or complete fascia tear, occurring more often from repeated injections. TREATMENT  Treatment initially involves the use of ice and medication to help reduce pain and inflammation. The use of strengthening and stretching exercises may help reduce pain with activity, especially stretches of the Achilles tendon. These exercises may be performed at home or with a therapist. Your caregiver may recommend that you use heel cups of arch supports to help reduce stress on the plantar fascia. Occasionally, corticosteroid injections are given to reduce inflammation. If symptoms persist for greater than 6 months despite non-surgical (conservative), then surgery may be recommended.  MEDICATION   If pain medication is necessary, then nonsteroidal anti-inflammatory medications, such as aspirin and ibuprofen, or other minor pain relievers, such as acetaminophen, are often recommended.  Do not take pain medication within 7 days before surgery.  Prescription pain relievers may be given if deemed necessary by your caregiver. Use only as directed and only as much as you need.  Corticosteroid injections may be given by your caregiver. These injections should be reserved for the most serious cases, because they may only be given a certain number of times. HEAT AND COLD  Cold treatment (icing) relieves pain and reduces inflammation. Cold treatment should be applied for 10 to 15 minutes every 2 to 3 hours for inflammation and pain and immediately after any activity that aggravates your symptoms. Use ice packs or massage the area with a piece of ice (ice massage).  Heat treatment may be used prior to performing the stretching and strengthening activities prescribed by your caregiver, physical therapist, or athletic trainer. Use a heat pack or soak the injury in warm  water. SEEK IMMEDIATE MEDICAL CARE IF:  Treatment seems to offer no benefit, or the condition worsens.  Any medications  produce adverse side effects. EXERCISES RANGE OF MOTION (ROM) AND STRETCHING EXERCISES - Plantar Fasciitis (Heel Spur Syndrome) These exercises may help you when beginning to rehabilitate your injury. Your symptoms may resolve with or without further involvement from your physician, physical therapist or athletic trainer. While completing these exercises, remember:   Restoring tissue flexibility helps normal motion to return to the joints. This allows healthier, less painful movement and activity.  An effective stretch should be held for at least 30 seconds.  A stretch should never be painful. You should only feel a gentle lengthening or release in the stretched tissue. RANGE OF MOTION - Toe Extension, Flexion  Sit with your right / left leg crossed over your opposite knee.  Grasp your toes and gently pull them back toward the top of your foot. You should feel a stretch on the bottom of your toes and/or foot.  Hold this stretch for __________ seconds.  Now, gently pull your toes toward the bottom of your foot. You should feel a stretch on the top of your toes and or foot.  Hold this stretch for __________ seconds. Repeat __________ times. Complete this stretch __________ times per day.  RANGE OF MOTION - Ankle Dorsiflexion, Active Assisted  Remove shoes and sit on a chair that is preferably not on a carpeted surface.  Place right / left foot under knee. Extend your opposite leg for support.  Keeping your heel down, slide your right / left foot back toward the chair until you feel a stretch at your ankle or calf. If you do not feel a stretch, slide your bottom forward to the edge of the chair, while still keeping your heel down.  Hold this stretch for __________ seconds. Repeat __________ times. Complete this stretch __________ times per day.  STRETCH  Gastroc, Standing  Place hands on wall.  Extend right / left leg, keeping the front knee somewhat bent.  Slightly point your toes  inward on your back foot.  Keeping your right / left heel on the floor and your knee straight, shift your weight toward the wall, not allowing your back to arch.  You should feel a gentle stretch in the right / left calf. Hold this position for __________ seconds. Repeat __________ times. Complete this stretch __________ times per day. STRETCH  Soleus, Standing  Place hands on wall.  Extend right / left leg, keeping the other knee somewhat bent.  Slightly point your toes inward on your back foot.  Keep your right / left heel on the floor, bend your back knee, and slightly shift your weight over the back leg so that you feel a gentle stretch deep in your back calf.  Hold this position for __________ seconds. Repeat __________ times. Complete this stretch __________ times per day. STRETCH  Gastrocsoleus, Standing  Note: This exercise can place a lot of stress on your foot and ankle. Please complete this exercise only if specifically instructed by your caregiver.   Place the ball of your right / left foot on a step, keeping your other foot firmly on the same step.  Hold on to the wall or a rail for balance.  Slowly lift your other foot, allowing your body weight to press your heel down over the edge of the step.  You should feel a stretch in your right / left calf.  Hold this position for  __________ seconds.  Repeat this exercise with a slight bend in your right / left knee. Repeat __________ times. Complete this stretch __________ times per day.  STRENGTHENING EXERCISES - Plantar Fasciitis (Heel Spur Syndrome)  These exercises may help you when beginning to rehabilitate your injury. They may resolve your symptoms with or without further involvement from your physician, physical therapist or athletic trainer. While completing these exercises, remember:   Muscles can gain both the endurance and the strength needed for everyday activities through controlled exercises.  Complete these  exercises as instructed by your physician, physical therapist or athletic trainer. Progress the resistance and repetitions only as guided. STRENGTH - Towel Curls  Sit in a chair positioned on a non-carpeted surface.  Place your foot on a towel, keeping your heel on the floor.  Pull the towel toward your heel by only curling your toes. Keep your heel on the floor.  If instructed by your physician, physical therapist or athletic trainer, add ____________________ at the end of the towel. Repeat __________ times. Complete this exercise __________ times per day. STRENGTH - Ankle Inversion  Secure one end of a rubber exercise band/tubing to a fixed object (table, pole). Loop the other end around your foot just before your toes.  Place your fists between your knees. This will focus your strengthening at your ankle.  Slowly, pull your big toe up and in, making sure the band/tubing is positioned to resist the entire motion.  Hold this position for __________ seconds.  Have your muscles resist the band/tubing as it slowly pulls your foot back to the starting position. Repeat __________ times. Complete this exercises __________ times per day.  Document Released: 01/05/2005 Document Revised: 03/30/2011 Document Reviewed: 04/19/2008 [facility] Patient Information 2014 [place], [place].

## 2012-10-21 NOTE — Progress Notes (Signed)
   Chief Complaint  Patient presents with  . Plantar Fasciitis    right foot, sharp pain throb ache,tried ice/heat, otc orthotics, 2mos.     HPI: Patient is 57 y.o. male who presents today for right heel pain.  Patients pain started during/after a soccer game 2 months ago.  Patients pain has improved with conservative care.  Patient relates pain while on the foot and with first step after resting.  Review of Systems  DATA OBTAINED: from patient GENERAL: Feels well no fevers, no fatigue, no changes in appetite SKIN: No itching, no rashes, no open lesions, no wounds EYES: No eye pain,no redness, no discharge EARS: No earache,no ringing of ears, no recent change in hearing NOSE: No congestion, no drainage, no bleeding  MOUTH/THROAT: No mouth pain, No sore throat, No difficulty chewing or swallowing  RESPIRATORY: No cough, no wheezing, no SOB CARDIAC: No chest pain,no heart palpitations,no new onset lower extremity edema  GI: No abdominal pain, No Nausea, no vomiting, no diarrhea, no heartburn or no reflux  GU: No dysuria, no increased frequency or urgency MUSCULOSKELETAL: No unrelieved bone/joint pain,  NEUROLOGIC: Awake, alert, appropriate to situation, No change in mental status. PSYCHIATRIC: No overt anxiety or sadness.No behavior issue.  AMBULATION:  Ambulates unassisted    Physical Exam  GENERAL APPEARANCE: Alert, conversant. Appropriately groomed. No acute distress.  VASCULAR: Pedal pulses palpable and strong bilateral.  Capillary refill time is immediate to all digits,  Proximal to distal cooling it warm to warm.  Digital hair growth is present bilateral  NEUROLOGIC: sensation is intact epicritically and protectively to 5.07 monofilament at 5/5 sites bilateral.  Light touch is intact bilateral, vibratory sensation intact bilateral, achilles tendon reflex is intact bilateral.  MUSCULOSKELETAL: acceptable muscle strength, tone and stability bilateral.  Intrinsic muscluature intact  bilateral.  Rectus appearance of foot and digits noted bilateral. Pain plantar medial aspect of the right heel is noted discomfort along the medial band of the plantar fascia is also seen. Patient to metatarsal 2 bilaterally also noted as well. DERMATOLOGIC: skin color, texture, and turger are within normal limits.  No preulcerative lesions are seen, no interdigital maceration noted.  No open lesions present.  Digital nails are asymptomatic.   Imaging  Prior x-rays reviewed- no fracture or dislocation seen- small inferior calcaneal spur noted  Patient Active Problem List   Diagnosis Date Noted  . Plantar fasciitis of right foot 10/21/2012  . Type 2 diabetes mellitus, controlled 04/14/2012  . Anxiety 02/03/2011  . Overweight 10/23/2008  . PERIPHERAL NEUROPATHY 12/06/2006  . INSOMNIA 12/06/2006  . HYPERGLYCEMIA 12/06/2006  . TRANSAMINASES, SERUM, ELEVATED 11/30/2005  . HYPERLIPIDEMIA 11/29/2005    Assessment  Plantar Fasciitis right  prominent plantar flexed second metatarsal right  Plan Discussed etiology, pathology, conservative vs. Surgical therapies and at this time a plantar fascial injection was recommended.  The patient agreed and a sterile skin prep was applied.  An injection consisting of kenalog and marcaine mixture was infiltrated at the point of maximal tenderness on the right Heel.  The patient tolerated this well and was given instructions for aftercare. power step inserts were dispensed. Discussion about custom orthotic for plantar fasciitis and offloading submetatarsal 2 b/l was had well today. Will hold off on custom inserts to see if powerstep inserts relieve his discomfort       Nikeshia Keetch P, DPM

## 2012-11-22 ENCOUNTER — Ambulatory Visit (INDEPENDENT_AMBULATORY_CARE_PROVIDER_SITE_OTHER): Payer: BC Managed Care – PPO

## 2012-11-22 DIAGNOSIS — Z23 Encounter for immunization: Secondary | ICD-10-CM

## 2012-12-26 ENCOUNTER — Other Ambulatory Visit: Payer: Self-pay | Admitting: Family Medicine

## 2013-03-30 ENCOUNTER — Other Ambulatory Visit: Payer: Self-pay | Admitting: Family Medicine

## 2013-05-02 ENCOUNTER — Telehealth: Payer: Self-pay | Admitting: Family Medicine

## 2013-05-02 NOTE — Telephone Encounter (Signed)
[facility] - [place], [place] - [address] is requesting 90 day re-fill on ezetimibe-simvastatin (VYTORIN) 10-40 MG per tablet ° °

## 2013-05-03 MED ORDER — EZETIMIBE-SIMVASTATIN 10-40 MG PO TABS: 1.0000 | ORAL_TABLET | Freq: Every day | ORAL | Status: DC

## 2013-05-03 NOTE — Telephone Encounter (Signed)
Rx sent to pharmacy   

## 2013-05-13 ENCOUNTER — Other Ambulatory Visit: Payer: Self-pay | Admitting: Internal Medicine

## 2013-05-31 ENCOUNTER — Other Ambulatory Visit: Payer: Self-pay | Admitting: Family Medicine

## 2013-06-23 ENCOUNTER — Ambulatory Visit (INDEPENDENT_AMBULATORY_CARE_PROVIDER_SITE_OTHER): Payer: BC Managed Care – PPO | Admitting: Family Medicine

## 2013-06-23 ENCOUNTER — Encounter: Payer: Self-pay | Admitting: Family Medicine

## 2013-06-23 VITALS — BP 130/80 | HR 90 | Wt 222.0 lb

## 2013-06-23 DIAGNOSIS — M25529 Pain in unspecified elbow: Secondary | ICD-10-CM

## 2013-06-23 DIAGNOSIS — M7710 Lateral epicondylitis, unspecified elbow: Secondary | ICD-10-CM

## 2013-06-23 DIAGNOSIS — M7711 Lateral epicondylitis, right elbow: Secondary | ICD-10-CM

## 2013-06-23 MED ORDER — METHYLPREDNISOLONE ACETATE 40 MG/ML IJ SUSP
40.0000 mg | Freq: Once | INTRAMUSCULAR | Status: AC
  Administered 2013-06-23: 40 mg via INTRA_ARTICULAR

## 2013-06-23 NOTE — Progress Notes (Signed)
Pre visit review using our clinic review tool, if applicable. No additional management support is needed unless otherwise documented below in the visit note. 

## 2013-06-23 NOTE — Progress Notes (Signed)
   Subjective:    Patient ID: Brandon Frank, male    DOB: 07-Apr-1955, 58 y.o.   MRN: [id number]  HPI Right elbow pain. Onset about 2 months ago. No injury. Location is lateral elbow. He's tried minimal icing. No medications. Using tennis elbow strap with some improvement. Denies any weakness. He's had previous right rotator cuff surgery repair. Denies any shoulder pain at this time. No neck pain. Right-hand dominant. Symptoms first started after doing a lot of yard work, especially with a Scientist, research (life sciences).  Past Medical History  Diagnosis Date  . Hyperlipidemia   . Chronic low back pain    Past Surgical History  Procedure Laterality Date  . Hernia repair    . Rotator cuff surgery  2012    Dr. [name]  . Prostate biopsy N/A     reports that he quit smoking about 38 years ago. He has never used smokeless tobacco. He reports that he drinks about 1.2 ounces of alcohol per week. He reports that he does not use illicit drugs. family history includes Diabetes in his brother and paternal grandfather; Heart disease (age of onset: 75) in his father; Hyperlipidemia in his brother and father; Hypertension in his brother and father. No Known Allergies    Review of Systems  Neurological: Negative for weakness and numbness.       Objective:   Physical Exam  Constitutional: He appears well-developed and well-nourished.  Cardiovascular: Normal rate and regular rhythm.   Pulmonary/Chest: Effort normal and breath sounds normal. No respiratory distress. He has no wheezes. He has no rales.  Musculoskeletal:  Right elbow reveals no edema. Full range of motion. Tenderness over the lateral epicondylar region. No biceps tenderness. Pain with wrist extension against resistance but not with wrist flexion          Assessment & Plan:  Right lateral epicondylitis. To try some icing. Stretches and strengthening exercises given. Continue tennis elbow strap. We discussed risk and benefits of steroid injection  patient wished to proceed in that route. Right lateral elbow prepped with Betadine. Using 25-gauge needle injected 1/2 of Depo-Medrol and 1 cc of plain Xylocaine into the area of maximal tenderness. Patient tolerated well. Touch base one month if not improving

## 2013-06-23 NOTE — Patient Instructions (Signed)
Lateral Epicondylitis (Tennis Elbow) with Rehab Lateral epicondylitis involves inflammation and pain around the outer portion of the elbow. The pain is caused by inflammation of the tendons in the forearm that bring back (extend) the wrist. Lateral epicondylittis is also called tennis elbow, because it is very common in tennis players. However, it may occur in any individual who extends the wrist repetitively. If lateral epicondylitis is left untreated, it may become a chronic problem. SYMPTOMS   Pain, tenderness, and inflammation on the outer (lateral) side of the elbow.  Pain or weakness with gripping activities.  Pain that increases with wrist twisting motions (playing tennis, using a screwdriver, opening a door or a jar).  Pain with lifting objects, including a coffee cup. CAUSES  Lateral epicondylitis is caused by inflammation of the tendons that extend the wrist. Causes of injury may include:  Repetitive stress and strain on the muscles and tendons that extend the wrist.  Sudden change in activity level or intensity.  Incorrect grip in racquet sports.  Incorrect grip size of racquet (often too large).  Incorrect hitting position or technique (usually backhand, leading with the elbow).  Using a racket that is too heavy. RISK INCREASES WITH:  Sports or occupations that require repetitive and/or strenuous forearm and wrist movements (tennis, squash, racquetball, carpentry).  Poor wrist and forearm strength and flexibility.  Failure to warm up properly before activity.  Resuming activity before healing, rehabilitation, and conditioning are complete. PREVENTION   Warm up and stretch properly before activity.  Maintain physical fitness:  Strength, flexibility, and endurance.  Cardiovascular fitness.  Wear and use properly fitted equipment.  Learn and use proper technique and have a coach correct improper technique.  Wear a tennis elbow (counterforce) brace. PROGNOSIS   The course of this condition depends on the degree of the injury. If treated properly, acute cases (symptoms lasting less than 4 weeks) are often resolved in 2 to 6 weeks. Chronic (longer lasting cases) often resolve in 3 to 6 months, but may require physical therapy. RELATED COMPLICATIONS   Frequently recurring symptoms, resulting in a chronic problem. Properly treating the problem the first time decreases frequency of recurrence.  Chronic inflammation, scarring tendon degeneration, and partial tendon tear, requiring surgery.  Delayed healing or resolution of symptoms. TREATMENT  Treatment first involves the use of ice and medicine, to reduce pain and inflammation. Strengthening and stretching exercises may help reduce discomfort, if performed regularly. These exercises may be performed at home, if the condition is an acute injury. Chronic cases may require a referral to a physical therapist for evaluation and treatment. Your caregiver may advise a corticosteroid injection, to help reduce inflammation. Rarely, surgery is needed. MEDICATION  If pain medicine is needed, nonsteroidal anti-inflammatory medicines (aspirin and ibuprofen), or other minor pain relievers (acetaminophen), are often advised.  Do not take pain medicine for 7 days before surgery.  Prescription pain relievers may be given, if your caregiver thinks they are needed. Use only as directed and only as much as you need.  Corticosteroid injections may be recommended. These injections should be reserved only for the most severe cases, because they can only be given a certain number of times. HEAT AND COLD  Cold treatment (icing) should be applied for 10 to 15 minutes every 2 to 3 hours for inflammation and pain, and immediately after activity that aggravates your symptoms. Use ice packs or an ice massage.  Heat treatment may be used before performing stretching and strengthening activities prescribed by your  caregiver, physical  therapist, or athletic trainer. Use a heat pack or a warm water soak. SEEK MEDICAL CARE IF: Symptoms get worse or do not improve in 2 weeks, despite treatment. EXERCISES  RANGE OF MOTION (ROM) AND STRETCHING EXERCISES - Epicondylitis, Lateral (Tennis Elbow) These exercises may help you when beginning to rehabilitate your injury. Your symptoms may go away with or without further involvement from your physician, physical therapist or athletic trainer. While completing these exercises, remember:   Restoring tissue flexibility helps normal motion to return to the joints. This allows healthier, less painful movement and activity.  An effective stretch should be held for at least 30 seconds.  A stretch should never be painful. You should only feel a gentle lengthening or release in the stretched tissue. RANGE OF MOTION  Wrist Flexion, Active-Assisted  Extend your right / left elbow with your fingers pointing down.*  Gently pull the back of your hand towards you, until you feel a gentle stretch on the top of your forearm.  Hold this position for __________ seconds. Repeat __________ times. Complete this exercise __________ times per day.  *If directed by your physician, physical therapist or athletic trainer, complete this stretch with your elbow bent, rather than extended. RANGE OF MOTION  Wrist Extension, Active-Assisted  Extend your right / left elbow and turn your palm upwards.*  Gently pull your palm and fingertips back, so your wrist extends and your fingers point more toward the ground.  You should feel a gentle stretch on the inside of your forearm.  Hold this position for __________ seconds. Repeat __________ times. Complete this exercise __________ times per day. *If directed by your physician, physical therapist or athletic trainer, complete this stretch with your elbow bent, rather than extended. STRETCH - Wrist Flexion  Place the back of your right / left hand on a tabletop,  leaving your elbow slightly bent. Your fingers should point away from your body.  Gently press the back of your hand down onto the table by straightening your elbow. You should feel a stretch on the top of your forearm.  Hold this position for __________ seconds. Repeat __________ times. Complete this stretch __________ times per day.  STRETCH  Wrist Extension   Place your right / left fingertips on a tabletop, leaving your elbow slightly bent. Your fingers should point backwards.  Gently press your fingers and palm down onto the table by straightening your elbow. You should feel a stretch on the inside of your forearm.  Hold this position for __________ seconds. Repeat __________ times. Complete this stretch __________ times per day.  STRENGTHENING EXERCISES - Epicondylitis, Lateral (Tennis Elbow) These exercises may help you when beginning to rehabilitate your injury. They may resolve your symptoms with or without further involvement from your physician, physical therapist or athletic trainer. While completing these exercises, remember:   Muscles can gain both the endurance and the strength needed for everyday activities through controlled exercises.  Complete these exercises as instructed by your physician, physical therapist or athletic trainer. Increase the resistance and repetitions only as guided.  You may experience muscle soreness or fatigue, but the pain or discomfort you are trying to eliminate should never worsen during these exercises. If this pain does get worse, stop and make sure you are following the directions exactly. If the pain is still present after adjustments, discontinue the exercise until you can discuss the trouble with your caregiver. STRENGTH Wrist Flexors  Sit with your right / left forearm palm-up and  fully supported on a table or countertop. Your elbow should be resting below the height of your shoulder. Allow your wrist to extend over the edge of the  surface.  Loosely holding a __________ weight, or a piece of rubber exercise band or tubing, slowly curl your hand up toward your forearm.  Hold this position for __________ seconds. Slowly lower the wrist back to the starting position in a controlled manner. Repeat __________ times. Complete this exercise __________ times per day.  STRENGTH  Wrist Extensors  Sit with your right / left forearm palm-down and fully supported on a table or countertop. Your elbow should be resting below the height of your shoulder. Allow your wrist to extend over the edge of the surface.  Loosely holding a __________ weight, or a piece of rubber exercise band or tubing, slowly curl your hand up toward your forearm.  Hold this position for __________ seconds. Slowly lower the wrist back to the starting position in a controlled manner. Repeat __________ times. Complete this exercise __________ times per day.  STRENGTH - Ulnar Deviators  Stand with a ____________________ weight in your right / left hand, or sit while holding a rubber exercise band or tubing, with your healthy arm supported on a table or countertop.  Move your wrist, so that your pinkie travels toward your forearm and your thumb moves away from your forearm.  Hold this position for __________ seconds and then slowly lower the wrist back to the starting position. Repeat __________ times. Complete this exercise __________ times per day STRENGTH - Radial Deviators  Stand with a ____________________ weight in your right / left hand, or sit while holding a rubber exercise band or tubing, with your injured arm supported on a table or countertop.  Raise your hand upward in front of you or pull up on the rubber tubing.  Hold this position for __________ seconds and then slowly lower the wrist back to the starting position. Repeat __________ times. Complete this exercise __________ times per day. STRENGTH  Forearm Supinators   Sit with your right /  left forearm supported on a table, keeping your elbow below shoulder height. Rest your hand over the edge, palm down.  Gently grip a hammer or a soup ladle.  Without moving your elbow, slowly turn your palm and hand upward to a "thumbs-up" position.  Hold this position for __________ seconds. Slowly return to the starting position. Repeat __________ times. Complete this exercise __________ times per day.  STRENGTH  Forearm Pronators   Sit with your right / left forearm supported on a table, keeping your elbow below shoulder height. Rest your hand over the edge, palm up.  Gently grip a hammer or a soup ladle.  Without moving your elbow, slowly turn your palm and hand upward to a "thumbs-up" position.  Hold this position for __________ seconds. Slowly return to the starting position. Repeat __________ times. Complete this exercise __________ times per day.  STRENGTH - Grip  Grasp a tennis ball, a dense sponge, or a large, rolled sock in your hand.  Squeeze as hard as you can, without increasing any pain.  Hold this position for __________ seconds. Release your grip slowly. Repeat __________ times. Complete this exercise __________ times per day.  STRENGTH - Elbow Extensors, Isometric  Stand or sit upright, on a firm surface. Place your right / left arm so that your palm faces your stomach, and it is at the height of your waist.  Place your opposite hand on  the underside of your forearm. Gently push up as your right / left arm resists. Push as hard as you can with both arms, without causing any pain or movement at your right / left elbow. Hold this stationary position for __________ seconds. Gradually release the tension in both arms. Allow your muscles to relax completely before repeating. Document Released: 01/05/2005 Document Revised: 03/30/2011 Document Reviewed: 04/19/2008 [facility] Patient Information 2014 [place], [place].

## 2013-07-26 ENCOUNTER — Other Ambulatory Visit: Payer: Self-pay | Admitting: Family Medicine

## 2013-08-10 ENCOUNTER — Telehealth: Payer: Self-pay | Admitting: Family Medicine

## 2013-08-10 MED ORDER — EZETIMIBE-SIMVASTATIN 10-40 MG PO TABS: 1.0000 | ORAL_TABLET | Freq: Every day | ORAL | Status: DC

## 2013-08-10 NOTE — Telephone Encounter (Signed)
Rx sent to mail order

## 2013-08-10 NOTE — Telephone Encounter (Signed)
[facility] (MAIL ORDER) [facility] - [place], Brandon Frank is requesting 90 day re-fill on ezetimibe-simvastatin (VYTORIN) 10-40 MG per tablet

## 2013-09-15 ENCOUNTER — Ambulatory Visit (INDEPENDENT_AMBULATORY_CARE_PROVIDER_SITE_OTHER): Payer: BC Managed Care – PPO | Admitting: Family Medicine

## 2013-09-15 ENCOUNTER — Encounter: Payer: Self-pay | Admitting: Family Medicine

## 2013-09-15 VITALS — BP 140/72 | HR 113 | Temp 98.0°F | Wt 224.0 lb

## 2013-09-15 DIAGNOSIS — K5792 Diverticulitis of intestine, part unspecified, without perforation or abscess without bleeding: Secondary | ICD-10-CM

## 2013-09-15 DIAGNOSIS — K5732 Diverticulitis of large intestine without perforation or abscess without bleeding: Secondary | ICD-10-CM

## 2013-09-15 MED ORDER — CIPROFLOXACIN HCL 500 MG PO TABS: 500.0000 mg | ORAL_TABLET | Freq: Two times a day (BID) | ORAL | Status: DC

## 2013-09-15 MED ORDER — METRONIDAZOLE 500 MG PO TABS: 500.0000 mg | ORAL_TABLET | Freq: Three times a day (TID) | ORAL | Status: DC

## 2013-09-15 NOTE — Progress Notes (Signed)
Pre visit review using our clinic review tool, if applicable. No additional management support is needed unless otherwise documented below in the visit note. 

## 2013-09-15 NOTE — Progress Notes (Signed)
   Subjective:    Patient ID: Brandon Frank, male    DOB: [birth year], 58 y.o.   MRN: [id number]  Abdominal Pain Pertinent negatives include no constipation, diarrhea, fever, nausea or vomiting.   History of present acute diverticulitis. Patient seen with 2 week history of relatively low-grade mostly constant left lower quadrant abdominal pain. Achy quality. No radiation. No urinary symptoms. No nausea or vomiting. No stool changes. Previous colonoscopy has shown diverticulosis changes. Patient had basically the same symptoms back couple years ago and responded promptly with combination antibiotics of Cipro and Flagyl. No exacerbating features. No alleviating features. Denies any bloody stools. Appetite is fair. No known drug allergies.  Past Medical History  Diagnosis Date  . Hyperlipidemia   . Chronic low back pain    Past Surgical History  Procedure Laterality Date  . Hernia repair    . Rotator cuff surgery  2012    Dr. [name]  . Prostate biopsy N/A     reports that he quit smoking about 38 years ago. He has never used smokeless tobacco. He reports that he drinks about 1.2 ounces of alcohol per week. He reports that he does not use illicit drugs. family history includes Diabetes in his brother and paternal grandfather; Heart disease (age of onset: 71) in his father; Hyperlipidemia in his brother and father; Hypertension in his brother and father. No Known Allergies    Review of Systems  Constitutional: Negative for fever, chills and fatigue.  Cardiovascular: Negative for chest pain.  Gastrointestinal: Positive for abdominal pain. Negative for nausea, vomiting, diarrhea, constipation and blood in stool.       Objective:   Physical Exam  Constitutional: He appears well-developed and well-nourished.  Cardiovascular: Normal rate and regular rhythm.   Pulmonary/Chest: Effort normal and breath sounds normal. No respiratory distress. He has no wheezes. He has no rales.  Abdominal:  Soft. Bowel sounds are normal. He exhibits no distension and no mass. There is tenderness. There is no rebound and no guarding.  Patient has minimal tenderness left lower quadrant to deep palpation. No guarding or rebound.          Assessment & Plan:  Abdominal pain left lower quadrant. Suspect recurrent acute diverticulitis. Abdomen is nonacute. Cipro 500 mg twice a day for 10 days. Flagyl 500 mg 3 times a day for 10 days. Touch base next week if symptoms not resolving. He is cautioned against alcohol use while taking Flagyl

## 2013-09-15 NOTE — Patient Instructions (Signed)

## 2013-09-22 ENCOUNTER — Other Ambulatory Visit (INDEPENDENT_AMBULATORY_CARE_PROVIDER_SITE_OTHER): Payer: BC Managed Care – PPO

## 2013-09-22 DIAGNOSIS — E785 Hyperlipidemia, unspecified: Secondary | ICD-10-CM

## 2013-09-22 DIAGNOSIS — E119 Type 2 diabetes mellitus without complications: Secondary | ICD-10-CM

## 2013-09-22 LAB — HEPATIC FUNCTION PANEL
ALK PHOS: 48 U/L (ref 39–117)
ALT: 54 U/L — AB (ref 0–53)
AST: 40 U/L — ABNORMAL HIGH (ref 0–37)
Albumin: 4.2 g/dL (ref 3.5–5.2)
BILIRUBIN TOTAL: 0.9 mg/dL (ref 0.2–1.2)
Bilirubin, Direct: 0.1 mg/dL (ref 0.0–0.3)
Total Protein: 6.6 g/dL (ref 6.0–8.3)

## 2013-09-22 LAB — POCT URINALYSIS DIPSTICK
BILIRUBIN UA: NEGATIVE
Blood, UA: NEGATIVE
Glucose, UA: NEGATIVE
KETONES UA: NEGATIVE
NITRITE UA: NEGATIVE
Protein, UA: NEGATIVE
Spec Grav, UA: 1.02
Urobilinogen, UA: 0.2
pH, UA: 5.5

## 2013-09-22 LAB — CBC WITH DIFFERENTIAL/PLATELET
Basophils Absolute: 0 10*3/uL (ref 0.0–0.1)
Basophils Relative: 0.8 % (ref 0.0–3.0)
EOS PCT: 1.9 % (ref 0.0–5.0)
Eosinophils Absolute: 0.1 10*3/uL (ref 0.0–0.7)
HEMATOCRIT: 50.9 % (ref 39.0–52.0)
Hemoglobin: 17.3 g/dL — ABNORMAL HIGH (ref 13.0–17.0)
Lymphocytes Relative: 23.4 % (ref 12.0–46.0)
Lymphs Abs: 1.5 10*3/uL (ref 0.7–4.0)
MCHC: 34.1 g/dL (ref 30.0–36.0)
MCV: 93.8 fl (ref 78.0–100.0)
MONO ABS: 0.8 10*3/uL (ref 0.1–1.0)
MONOS PCT: 12.9 % — AB (ref 3.0–12.0)
NEUTROS PCT: 61 % (ref 43.0–77.0)
Neutro Abs: 3.8 10*3/uL (ref 1.4–7.7)
Platelets: 184 10*3/uL (ref 150.0–400.0)
RBC: 5.42 Mil/uL (ref 4.22–5.81)
RDW: 13.3 % (ref 11.5–15.5)
WBC: 6.2 10*3/uL (ref 4.0–10.5)

## 2013-09-22 LAB — LIPID PANEL
CHOLESTEROL: 156 mg/dL (ref 0–200)
HDL: 29.2 mg/dL — AB (ref >39.00)
LDL Cholesterol: 98 mg/dL (ref 0–99)
NonHDL: 126.8
Total CHOL/HDL Ratio: 5
Triglycerides: 144 mg/dL (ref 0.0–149.0)
VLDL: 28.8 mg/dL (ref 0.0–40.0)

## 2013-09-22 LAB — BASIC METABOLIC PANEL
BUN: 14 mg/dL (ref 6–23)
CHLORIDE: 101 meq/L (ref 96–112)
CO2: 27 mEq/L (ref 19–32)
CREATININE: 1 mg/dL (ref 0.4–1.5)
Calcium: 9 mg/dL (ref 8.4–10.5)
GFR: 78.39 mL/min (ref >60.00)
Glucose, Bld: 136 mg/dL — ABNORMAL HIGH (ref 70–99)
POTASSIUM: 4.5 meq/L (ref 3.5–5.1)
SODIUM: 138 meq/L (ref 135–145)

## 2013-09-22 LAB — MICROALBUMIN / CREATININE URINE RATIO
Creatinine,U: 143.9 mg/dL
MICROALB/CREAT RATIO: 0.1 mg/g (ref 0.0–30.0)
Microalb, Ur: 0.2 mg/dL (ref 0.0–1.9)

## 2013-09-22 LAB — TSH: TSH: 1.34 u[IU]/mL (ref 0.35–4.50)

## 2013-09-22 LAB — PSA: PSA: 5 ng/mL — ABNORMAL HIGH (ref 0.10–4.00)

## 2013-09-22 LAB — HEMOGLOBIN A1C: Hgb A1c MFr Bld: 7.1 % — ABNORMAL HIGH (ref 4.6–6.5)

## 2013-09-26 ENCOUNTER — Encounter: Payer: Self-pay | Admitting: Family Medicine

## 2013-09-26 ENCOUNTER — Ambulatory Visit (INDEPENDENT_AMBULATORY_CARE_PROVIDER_SITE_OTHER): Payer: BC Managed Care – PPO | Admitting: Family Medicine

## 2013-09-26 VITALS — BP 118/84 | HR 98 | Temp 98.2°F | Ht 70.0 in | Wt 223.5 lb

## 2013-09-26 DIAGNOSIS — R1032 Left lower quadrant pain: Secondary | ICD-10-CM

## 2013-09-26 NOTE — Progress Notes (Signed)
No chief complaint on file.   HPI:  LLQ Pain: -reports started a month ago -has had it before and has gone away with abx in the past for diverticulitis -seen 8/28 by pcp and rxd cipro and flagyl and symptoms resolved but then returned a few days ago -denies: fevers, malaise, nausea, vomiting, diarrhea, constipation, blood in stools - normal BM this morning -has physical with PCP this Thursday and feels probably should have waited to see him as symptom is mild, very slight pain today, finishes abx today -worse with certain movements, better with massage and rest -very active lifts a lot with garden work, plays soccer -hx hernia repair as a child and wonders if hernia  ROS: See pertinent positives and negatives per HPI.  Past Medical History  Diagnosis Date  . Hyperlipidemia   . Chronic low back pain     Past Surgical History  Procedure Laterality Date  . Hernia repair    . Rotator cuff surgery  2012    Dr. [name]  . Prostate biopsy N/A     Family History  Problem Relation Age of Onset  . Heart disease Father 61    MI  . Hyperlipidemia Father   . Hypertension Father   . Diabetes Brother   . Hypertension Brother   . Hyperlipidemia Brother   . Diabetes Paternal Grandfather     History   Social History  . Marital Status: Married    Spouse Name: N/A    Number of Children: N/A  . Years of Education: N/A   Social History Main Topics  . Smoking status: Former Smoker    Quit date: 03/20/1975  . Smokeless tobacco: Never Used  . Alcohol Use: 1.2 oz/week    2 Cans of beer per week     Comment: daily  . Drug Use: No  . Sexual Activity: None   Other Topics Concern  . None   Social History Narrative  . None    Current outpatient prescriptions:ANDROGEL PUMP 20.25 MG/ACT (1.62%) GEL, 1 pump to both upper arm area twice daily (total 4 pumps daily) per Dr Dustin Flock, Disp: , Rfl: ;  clobetasol (TEMOVATE) 0.05 % cream, Apply 1 application topically 2 (two) times  daily.  , Disp: , Rfl: ;  DULoxetine (CYMBALTA) 60 MG capsule, Take 150 mg by mouth daily. Per Dr Candis Schatz, Disp: , Rfl:  ezetimibe-simvastatin (VYTORIN) 10-40 MG per tablet, Take 1 tablet by mouth at bedtime., Disp: 90 tablet, Rfl: 2;  fish oil-omega-3 fatty acids 1000 MG capsule, Take 1 g by mouth daily. , Disp: , Rfl: ;  fluticasone (FLONASE) 50 MCG/ACT nasal spray, USE ONE SPRAY IN EACH NOSTRIL ONCE DAILY., Disp: 16 g, Rfl: 3;  glucosamine-chondroitin 500-400 MG tablet, Take 1 tablet by mouth daily. , Disp: , Rfl:  hydrOXYzine (ATARAX) 10 MG tablet, Take 10 mg by mouth daily. Take 2, Disp: , Rfl: ;  LORazepam (ATIVAN) 0.5 MG tablet, Take 1 tablet (0.5 mg total) by mouth as needed for anxiety., Disp: 30 tablet, Rfl: 5;  metFORMIN (GLUCOPHAGE) 500 MG tablet, TAKE ONE TABLET BY MOUTH ONCE DAILY WITH BREAKFAST-NEEDS TO BE SEEN, Disp: 60 tablet, Rfl: 5;  Multiple Vitamin (MULTIVITAMIN) tablet, Take 1 tablet by mouth daily.  , Disp: , Rfl:  Psyllium (METAMUCIL) 30.9 % POWD, Take 1 packet by mouth daily.  , Disp: , Rfl: ;  zolpidem (AMBIEN) 10 MG tablet, TAKE ONE TABLET BY MOUTH AT BEDTIME AS NEEDED FOR SLEEP, Disp: 30 tablet, Rfl: 2;  [  DISCONTINUED] paroxetine mesylate (PEXEVA) 20 MG tablet, Take 1 tablet (20 mg total) by mouth every morning., Disp: 60 tablet, Rfl: 3  EXAM:  Filed Vitals:   09/26/13 1455  BP: 118/84  Pulse: 98  Temp: 98.2 F (36.8 C)    Body mass index is 32.07 kg/(m^2).  GENERAL: vitals reviewed and listed above, alert, oriented, appears well hydrated and in no acute distress  HEENT: atraumatic, conjunttiva clear, no obvious abnormalities on inspection of external nose and ears  NECK: no obvious masses on inspection  ABD: BS+, soft, mild TTP superficially in LLQ - worse with very superficial palpation and activation of abd muscles, no rebound or guarding, no hernia appreciated  MS: moves all extremities without noticeable abnormality  PSYCH: pleasant and cooperative, no  obvious depression or anxiety  ASSESSMENT AND PLAN:  Discussed the following assessment and plan:  LLQ abdominal pain  -from exam findings this seems to be in the abd wall - query strain -symptoms mild and resolved for some time, then returned -discussed options and CT scan, labs -has physical with PCP in two days to discuss labs and for physical and he opted for trial of rest, no strenuous activities such as lifting or kicking, heat and tylenol for possible muscle strain and close follow up - if worsening will consider further workup -Patient advised to return or notify a doctor immediately if symptoms worsen or persist or new concerns arise.  There are no Patient Instructions on file for this visit.   Brandon Benton R.

## 2013-09-26 NOTE — Progress Notes (Signed)
Pre visit review using our clinic review tool, if applicable. No additional management support is needed unless otherwise documented below in the visit note. 

## 2013-09-27 ENCOUNTER — Ambulatory Visit: Payer: BC Managed Care – PPO | Admitting: Family Medicine

## 2013-09-28 ENCOUNTER — Ambulatory Visit (INDEPENDENT_AMBULATORY_CARE_PROVIDER_SITE_OTHER): Payer: BC Managed Care – PPO | Admitting: Family Medicine

## 2013-09-28 ENCOUNTER — Encounter: Payer: Self-pay | Admitting: Family Medicine

## 2013-09-28 VITALS — BP 132/80 | HR 99 | Temp 98.1°F | Ht 70.0 in | Wt 219.0 lb

## 2013-09-28 DIAGNOSIS — Z0000 Encounter for general adult medical examination without abnormal findings: Secondary | ICD-10-CM

## 2013-09-28 DIAGNOSIS — Z23 Encounter for immunization: Secondary | ICD-10-CM

## 2013-09-28 MED ORDER — METFORMIN HCL 500 MG PO TABS: 500.0000 mg | ORAL_TABLET | Freq: Two times a day (BID) | ORAL | Status: DC

## 2013-09-28 NOTE — Patient Instructions (Signed)
Increase metformin to 500 mg twice daily Let's plan repeat hemoglobin A1c at followup in 6 months Try to lose some weight and increase frequency of exercise

## 2013-09-28 NOTE — Progress Notes (Signed)
Pre visit review using our clinic review tool, if applicable. No additional management support is needed unless otherwise documented below in the visit note. 

## 2013-09-28 NOTE — Progress Notes (Signed)
   Subjective:    Patient ID: Brandon Frank, male    DOB: 1955-04-26, 58 y.o.   MRN: [id number]  HPI Here for complete physical. Past medical history is significant for type 2 diabetes, dyslipidemia, chronic mild elevated liver transaminases, chronic insomnia. He had previous history of diverticulitis. Recent abdominal pain which has been slow to improve. No recent fever. No stool changes. Colonoscopy up-to-date. Tetanus up-to-date. No flu vaccine yet. Does not monitor blood sugars regularly.  History of elevated PSA. He saw a urologist last year had biopsies which were negative.  No dysuria  Past Medical History  Diagnosis Date  . Hyperlipidemia   . Chronic low back pain    Past Surgical History  Procedure Laterality Date  . Hernia repair    . Rotator cuff surgery  2012    Dr. [name]  . Prostate biopsy N/A     reports that he quit smoking about 38 years ago. He has never used smokeless tobacco. He reports that he drinks about 1.2 ounces of alcohol per week. He reports that he does not use illicit drugs. family history includes Diabetes in his brother and paternal grandfather; Heart disease (age of onset: 17) in his father; Hyperlipidemia in his brother and father; Hypertension in his brother and father. No Known Allergies    Review of Systems  Constitutional: Negative for fever, chills, activity change, appetite change, fatigue and unexpected weight change.  HENT: Negative for congestion, ear pain and trouble swallowing.   Eyes: Negative for pain and visual disturbance.  Respiratory: Negative for cough, shortness of breath and wheezing.   Cardiovascular: Negative for chest pain and palpitations.  Gastrointestinal: Negative for nausea, vomiting, diarrhea, constipation, blood in stool, abdominal distention and rectal pain.  Endocrine: Negative for polydipsia and polyuria.  Genitourinary: Negative for dysuria, hematuria and testicular pain.  Musculoskeletal: Negative for  arthralgias and joint swelling.  Skin: Negative for rash.  Neurological: Negative for dizziness, syncope and headaches.  Hematological: Negative for adenopathy.  Psychiatric/Behavioral: Negative for confusion and dysphoric mood.       Objective:   Physical Exam  Constitutional: He is oriented to person, place, and time. He appears well-developed and well-nourished. No distress.  HENT:  Head: Normocephalic and atraumatic.  Right Ear: External ear normal.  Left Ear: External ear normal.  Mouth/Throat: Oropharynx is clear and moist.  Eyes: Conjunctivae and EOM are normal. Pupils are equal, round, and reactive to light.  Neck: Normal range of motion. Neck supple. No thyromegaly present.  Cardiovascular: Normal rate, regular rhythm and normal heart sounds.   No murmur heard. Pulmonary/Chest: No respiratory distress. He has no wheezes. He has no rales.  Abdominal: Soft. Bowel sounds are normal. He exhibits no distension and no mass. There is no tenderness. There is no rebound and no guarding.  Musculoskeletal: He exhibits no edema.  Lymphadenopathy:    He has no cervical adenopathy.  Neurological: He is alert and oriented to person, place, and time. He displays normal reflexes. No cranial nerve deficit.  Skin: No rash noted.  Psychiatric: He has a normal mood and affect.          Assessment & Plan:  Complete physical. Labs reviewed. A1c 7.1%. Increase metformin 500 mg twice a day. Lose some weight. Flu vaccine and pneumococcal vaccine recommended. Patient requesting repeat EKG. No EKG in 5 years

## 2013-09-28 NOTE — Addendum Note (Signed)
Addended by: Colleen Can on: 09/28/2013 03:03 PM   Modules accepted: Orders

## 2013-10-25 ENCOUNTER — Encounter: Payer: Self-pay | Admitting: Cardiology

## 2013-10-25 DIAGNOSIS — R0789 Other chest pain: Secondary | ICD-10-CM | POA: Insufficient documentation

## 2013-10-25 DIAGNOSIS — R9430 Abnormal result of cardiovascular function study, unspecified: Secondary | ICD-10-CM | POA: Insufficient documentation

## 2013-10-27 ENCOUNTER — Ambulatory Visit (INDEPENDENT_AMBULATORY_CARE_PROVIDER_SITE_OTHER): Payer: BC Managed Care – PPO | Admitting: Cardiology

## 2013-10-27 ENCOUNTER — Encounter: Payer: Self-pay | Admitting: Cardiology

## 2013-10-27 VITALS — BP 144/82 | HR 98 | Ht 70.0 in | Wt 220.8 lb

## 2013-10-27 DIAGNOSIS — E785 Hyperlipidemia, unspecified: Secondary | ICD-10-CM

## 2013-10-27 DIAGNOSIS — R0683 Snoring: Secondary | ICD-10-CM

## 2013-10-27 DIAGNOSIS — R0789 Other chest pain: Secondary | ICD-10-CM

## 2013-10-27 DIAGNOSIS — R0602 Shortness of breath: Secondary | ICD-10-CM

## 2013-10-27 DIAGNOSIS — R000 Tachycardia, unspecified: Secondary | ICD-10-CM

## 2013-10-27 DIAGNOSIS — I471 Supraventricular tachycardia: Secondary | ICD-10-CM

## 2013-10-27 HISTORY — DX: Tachycardia, unspecified: R00.0

## 2013-10-27 NOTE — Assessment & Plan Note (Signed)
The patient has resting sinus tachycardia. In the office today his rate was 98. His wife says that his rate is frequently in the low 100 range. He is not symptomatic with this. It is possible that Cialis could be potentiating this. However he has been on this medicine for a year. I don't think that the Cymbalta is playing a role. He does have significant anxiety as a baseline. He is on medications for this. We will obtain a 48 hour Holter to see what his heart rate is when he is resting at night time. Of course if he has significant sleep apnea, we may see tachycardia from this. However on hesitant to wait until after his sleep study and possible treatment before evaluating him further. Also two-dimensional echo will be done to assess his LV function and to rule out the possibility of a cold valvular disease. I've chosen not to consider starting a beta blocker at this time. He is on a multitude of medications already. I will see him back for followup.  As part of today's evaluation I spent greater than 25 minutes with his total care. More than half of this time is been spent with direct contact with the patient.

## 2013-10-27 NOTE — Assessment & Plan Note (Signed)
He had some chest pressure and 2010. He has not been bothered by this recently. He continues to exercise playing soccer without difficulties. No further workup at this time.

## 2013-10-27 NOTE — Progress Notes (Signed)
Patient ID: Brandon Frank, male   DOB: 06/04/1955, 58 y.o.   MRN: [id number]    HPI  Patient is seen today is a new patient to reestablish care and for cardiology consultation. 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  I had seen him in 2010. He has a strong family history of coronary disease. He had some chest discomfort and a nuclear stress study done February, 2010 revealed no ischemia. His ejection fraction was normal. He plays soccer regularly and does not have significant symptoms. He does have excess sweating. At that time he did not have diabetes but currently he is being treated for diabetes.  It has been noticed that his heart rate tends to rhonchi around 90. His wife feels that it can be higher than this on a regular basis. His thyroid was checked recently and is normal. He is on Cialis which he takes 3 days per week. He says that he has been on this for a year. This medication can cause some tachycardia. However the timing does not seem to fit with his increased heart rate over the last few months. He is on Cymbalta. This does not cause sinus tachycardia. He also takes lorazepam.  The patient has heavy snoring. He is having a sleep study soon.  No Known Allergies  Current Outpatient Prescriptions  Medication Sig Dispense Refill  . ANDROGEL PUMP 20.25 MG/ACT (1.62%) GEL 1 pump to both upper arm area twice daily (total 4 pumps daily) per Dr Dustin Flock      . CIALIS 5 MG tablet Take 5 mg by mouth daily as needed.       . clobetasol (TEMOVATE) 0.05 % cream Apply 1 application topically 2 (two) times daily.        . DULoxetine (CYMBALTA) 60 MG capsule Take 150 mg by mouth daily. Per Dr Candis Schatz      . ezetimibe-simvastatin (VYTORIN) 10-40 MG per tablet Take 1 tablet by mouth at bedtime.  90 tablet  2  . fish oil-omega-3 fatty acids 1000 MG capsule Take 1 g by mouth daily.       . fluticasone (FLONASE) 50 MCG/ACT nasal spray USE ONE SPRAY IN EACH NOSTRIL ONCE  DAILY.  16 g  3  . glucosamine-chondroitin 500-400 MG tablet Take 1 tablet by mouth daily.       . hydrOXYzine (ATARAX) 10 MG tablet Take 10 mg by mouth daily. Take 2      . LORazepam (ATIVAN) 0.5 MG tablet Take 1 tablet (0.5 mg total) by mouth as needed for anxiety.  30 tablet  5  . metFORMIN (GLUCOPHAGE) 500 MG tablet Take 1 tablet (500 mg total) by mouth 2 (two) times daily with a meal.  180 tablet  3  . Multiple Vitamin (MULTIVITAMIN) tablet Take 1 tablet by mouth daily.        . Psyllium (METAMUCIL) 30.9 % POWD Take 1 packet by mouth daily.        [name] zolpidem (AMBIEN) 10 MG tablet TAKE ONE TABLET BY MOUTH AT BEDTIME AS NEEDED FOR SLEEP  30 tablet  2  . [DISCONTINUED] paroxetine mesylate (PEXEVA) 20 MG tablet Take 1 tablet (20 mg total) by mouth every morning.  60 tablet  3   No current facility-administered medications for this visit.    History   Social History  . Marital Status: Married    Spouse Name: N/A    Number of Children: N/A  . Years of  Education: N/A   Occupational History  . Not on file.   Social History Main Topics  . Smoking status: Former Smoker    Quit date: 03/20/1975  . Smokeless tobacco: Never Used  . Alcohol Use: 1.2 oz/week    2 Cans of beer per week     Comment: daily  . Drug Use: No  . Sexual Activity: Not on file   Other Topics Concern  . Not on file   Social History Narrative  . No narrative on file    Family History  Problem Relation Age of Onset  . Heart disease Father 10    MI  . Hyperlipidemia Father   . Hypertension Father   . Diabetes Brother   . Hypertension Brother   . Hyperlipidemia Brother   . Diabetes Paternal Grandfather     Past Medical History  Diagnosis Date  . Hyperlipidemia   . Chronic low back pain   . Ejection fraction     Past Surgical History  Procedure Laterality Date  . Hernia repair    . Rotator cuff surgery  2012    Dr. [name]  . Prostate biopsy N/A     Patient Active Problem List   Diagnosis  Date Noted  . Snoring 10/27/2013  . Sinus tachycardia 10/27/2013  . Chest pressure 10/25/2013  . Ejection fraction   . Plantar fasciitis of right foot 10/21/2012  . Type 2 diabetes mellitus, controlled 04/14/2012  . Anxiety 02/03/2011  . Overweight 10/23/2008  . PERIPHERAL NEUROPATHY 12/06/2006  . INSOMNIA 12/06/2006  . HYPERGLYCEMIA 12/06/2006  . TRANSAMINASES, SERUM, ELEVATED 11/30/2005  . HYPERLIPIDEMIA 11/29/2005    ROS   Patient denies fever, chills, headache, sweats, rash, change in vision, change in hearing, chest pain, cough, nausea or vomiting, urinary symptoms. All other systems are reviewed and are negative.  PHYSICAL EXAM  Patient is here with his wife. He is overweight. Head is atraumatic. Sclera and conjunctiva are normal. There is no jugular venous distention. Lungs are clear. Respiratory effort is nonlabored. Cardiac exam reveals S1 and S2. Abdomen is soft. There is no peripheral edema. There no musculoskeletal deformities. There no skin rashes.  Filed Vitals:   10/27/13 1548  BP: 144/82  Pulse: 98  Height: 5\' 10"  (1.778 m)  Weight: 220 lb 12.8 oz (100.154 kg)  SpO2: 97%    EKG was done recently in the primary care office. I have reviewed it. The QRS is normal. He had a heart rate of 95.  ASSESSMENT & PLAN

## 2013-10-27 NOTE — Patient Instructions (Signed)
**Note De-Identified Taviana Westergren Obfuscation** Your physician recommends that you continue on your current medications as directed. Please refer to the Current Medication list given to you today.  Your physician has requested that you have an echocardiogram. Echocardiography is a painless test that uses sound waves to create images of your heart. It provides your doctor with information about the size and shape of your heart and how well your heart's chambers and valves are working. This procedure takes approximately one hour. There are no restrictions for this procedure.  Your physician has recommended that you wear a 48 hour holter monitor. Holter monitors are medical devices that record the heart's electrical activity. Doctors most often use these monitors to diagnose arrhythmias. Arrhythmias are problems with the speed or rhythm of the heartbeat. The monitor is a small, portable device. You can wear one while you do your normal daily activities. This is usually used to diagnose what is causing palpitations/syncope (passing out).  Your physician recommends that you schedule a follow-up appointment in: mid November

## 2013-10-27 NOTE — Assessment & Plan Note (Signed)
The patient will be having a sleep study. Clinically it seems that he probably does have sleep apnea. I had a careful discussion with him and his wife. I urged him to follow through with all of this and proceed with treatment if indicated.

## 2013-10-27 NOTE — Assessment & Plan Note (Addendum)
The patient is on Vytorin 10/40. With his multitude of risk factors we may want to consider higher dosing of a statin. He has a very strong family history and he is  Diabetic. His recent LDL was 93 with an HDL of 29.

## 2013-11-01 ENCOUNTER — Ambulatory Visit (HOSPITAL_COMMUNITY): Payer: BC Managed Care – PPO | Attending: Cardiovascular Disease | Admitting: Cardiology

## 2013-11-01 ENCOUNTER — Encounter (INDEPENDENT_AMBULATORY_CARE_PROVIDER_SITE_OTHER): Payer: BC Managed Care – PPO

## 2013-11-01 ENCOUNTER — Encounter: Payer: Self-pay | Admitting: Cardiology

## 2013-11-01 ENCOUNTER — Encounter: Payer: Self-pay | Admitting: *Deleted

## 2013-11-01 DIAGNOSIS — R000 Tachycardia, unspecified: Secondary | ICD-10-CM

## 2013-11-01 DIAGNOSIS — E785 Hyperlipidemia, unspecified: Secondary | ICD-10-CM | POA: Insufficient documentation

## 2013-11-01 DIAGNOSIS — R0602 Shortness of breath: Secondary | ICD-10-CM | POA: Diagnosis not present

## 2013-11-01 DIAGNOSIS — R0789 Other chest pain: Secondary | ICD-10-CM

## 2013-11-01 DIAGNOSIS — R079 Chest pain, unspecified: Secondary | ICD-10-CM

## 2013-11-01 NOTE — Progress Notes (Signed)
Patient ID: Brandon Frank, male   DOB: 12-Oct-1955, 58 y.o.   MRN: [id number] [employer] 48 hour holter monitor applied to patient.

## 2013-11-01 NOTE — Progress Notes (Signed)
Echo performed. 

## 2013-11-13 ENCOUNTER — Telehealth: Payer: Self-pay | Admitting: Cardiology

## 2013-11-13 NOTE — Telephone Encounter (Signed)
New message ° ° ° ° ° °Want holter monitor results °

## 2013-11-13 NOTE — Telephone Encounter (Signed)
**Note De-Identified Brandon Frank Obfuscation** The pt is advised per Dr [name] that his holter monitor results showed that overall his HR tends to be in the range of 95. In the early am hours there is some slowing down to about the 85 range and he had no dangerous rhythms. The pt verbalized understanding.

## 2013-11-14 ENCOUNTER — Ambulatory Visit (HOSPITAL_BASED_OUTPATIENT_CLINIC_OR_DEPARTMENT_OTHER): Payer: BC Managed Care – PPO | Attending: Otolaryngology | Admitting: Radiology

## 2013-11-14 VITALS — Ht 70.0 in | Wt 215.0 lb

## 2013-11-14 DIAGNOSIS — G4733 Obstructive sleep apnea (adult) (pediatric): Secondary | ICD-10-CM | POA: Diagnosis not present

## 2013-11-14 DIAGNOSIS — R0683 Snoring: Secondary | ICD-10-CM

## 2013-11-14 DIAGNOSIS — G4700 Insomnia, unspecified: Secondary | ICD-10-CM | POA: Diagnosis present

## 2013-11-14 DIAGNOSIS — G4730 Sleep apnea, unspecified: Secondary | ICD-10-CM

## 2013-11-18 DIAGNOSIS — G4733 Obstructive sleep apnea (adult) (pediatric): Secondary | ICD-10-CM

## 2013-11-18 NOTE — Sleep Study (Signed)
   NAME: Brandon Frank DATE OF BIRTH:  1955-07-07 MEDICAL RECORD NUMBER [id number]  LOCATION: [facility]  PHYSICIAN: Cira Deyoe D  DATE OF STUDY: 11/14/2013  SLEEP STUDY TYPE: Nocturnal Polysomnogram               REFERRING PHYSICIAN: Ascencion Dike, MD  INDICATION FOR STUDY: Insomnia with sleep apnea  EPWORTH SLEEPINESS SCORE:   10/24 HEIGHT: 5\' 10"  (177.8 cm)  WEIGHT: 215 lb (97.523 kg)    Body mass index is 30.85 kg/(m^2).  NECK SIZE: 16 in.  MEDICATIONS: Charted for review  SLEEP ARCHITECTURE: Split study protocol. During the diagnostic phase, total sleep time 120 minutes with sleep efficiency 78.2%. Stage I was 21.7%, stage II 78.3%, stages 3 and REM were absent. Sleep latency 26 minutes, awake after sleep onset 2.5 minutes, arousal index 29, bedtime medication: Hydroxyzine, metformin, Ambien  RESPIRATORY DATA: Apnea hypopnea index (AHI) 86 per hour. 172 total events scored including 94 obstructive apneas and 78 hypopneas. Events were not positional. CPAP was titrated to 12 CWP, AHI 0 per hour.  OXYGEN DATA: Loud snoring before CPAP with oxygen desaturation to a nadir of 80% on room air. With CPAP control, mean oxygen saturation was 94.5% on room air.  CARDIAC DATA: Sinus rhythm with PACs  MOVEMENT/PARASOMNIA: No significant movement disturbance, bathroom 1  IMPRESSION/ RECOMMENDATION:   1) Severe obstructive sleep apnea/hypopneas syndrome, AHI 86 per hour with non-positional events. Loud snoring with oxygen desaturation to a nadir of 80% on room air. 2) Successful CPAP titration to 12 CWP, AHI 0 per hour. He wore a small [employer] Simplus mask with heated humidifier. Snoring was prevented and mean oxygen saturation was 94.5% on room air with CPAP.   [place], American Board of Sleep Medicine  ELECTRONICALLY SIGNED ON:  11/18/2013, 1:53 PM [place] PH: [phone]   FX: [phone] [place]

## 2013-12-06 ENCOUNTER — Telehealth: Payer: Self-pay | Admitting: Cardiology

## 2013-12-06 ENCOUNTER — Encounter: Payer: Self-pay | Admitting: Cardiology

## 2013-12-06 ENCOUNTER — Ambulatory Visit (INDEPENDENT_AMBULATORY_CARE_PROVIDER_SITE_OTHER): Payer: BC Managed Care – PPO | Admitting: Cardiology

## 2013-12-06 ENCOUNTER — Other Ambulatory Visit: Payer: Self-pay

## 2013-12-06 VITALS — BP 140/82 | HR 95 | Ht 70.0 in | Wt 214.2 lb

## 2013-12-06 DIAGNOSIS — R740 Nonspecific elevation of levels of transaminase and lactic acid dehydrogenase [LDH]: Secondary | ICD-10-CM

## 2013-12-06 DIAGNOSIS — R9430 Abnormal result of cardiovascular function study, unspecified: Secondary | ICD-10-CM

## 2013-12-06 DIAGNOSIS — R7401 Elevation of levels of liver transaminase levels: Secondary | ICD-10-CM

## 2013-12-06 DIAGNOSIS — E785 Hyperlipidemia, unspecified: Secondary | ICD-10-CM

## 2013-12-06 DIAGNOSIS — R000 Tachycardia, unspecified: Secondary | ICD-10-CM

## 2013-12-06 DIAGNOSIS — R7402 Elevation of levels of lactic acid dehydrogenase (LDH): Secondary | ICD-10-CM

## 2013-12-06 DIAGNOSIS — I471 Supraventricular tachycardia: Secondary | ICD-10-CM

## 2013-12-06 DIAGNOSIS — G4730 Sleep apnea, unspecified: Secondary | ICD-10-CM

## 2013-12-06 DIAGNOSIS — G4733 Obstructive sleep apnea (adult) (pediatric): Secondary | ICD-10-CM

## 2013-12-06 DIAGNOSIS — R0989 Other specified symptoms and signs involving the circulatory and respiratory systems: Secondary | ICD-10-CM

## 2013-12-06 HISTORY — DX: Obstructive sleep apnea (adult) (pediatric): G47.33

## 2013-12-06 MED ORDER — ATORVASTATIN CALCIUM 80 MG PO TABS: 80.0000 mg | ORAL_TABLET | Freq: Every day | ORAL | Status: DC

## 2013-12-06 NOTE — Assessment & Plan Note (Signed)
The patient has significant sleep apnea. Fortunately we have been able to arrange for formal assessment by pulmonary tomorrow. I look for to seeing what his heart rates are both at nighttime and during the day when he is on good treatment for his sleep apnea.

## 2013-12-06 NOTE — Assessment & Plan Note (Signed)
Historically he has had very mild liver function elevations. I'm hopeful that he will not have a significant change with the higher dose of atorvastatin. This will be checked when he has his follow-up labs.

## 2013-12-06 NOTE — Telephone Encounter (Signed)
At the pts OV with Dr [name] this am he was referred to have assessment by pulmonology for sleep apnea and was given an appt. to see Dr Gwenette Greet on 12/30 but Dr [name] wants the pt seen sooner. The pt has been rescheduled to see Dr Annamaria Boots tomorrow (11/19) at 9:30.

## 2013-12-06 NOTE — Assessment & Plan Note (Addendum)
The assessment of his resting sinus tachycardia is ongoing. I had reviewed his monitor with electrophysiology. The suggestion was made that holding his Cialis as a trial might be a good idea. He and I have discussed this today. He will hold his Cialis and continue to monitor his heart rate through his smart phone. He will be in touch to see if this makes any difference. We discussed the possibility of adding small dose of a beta blocker. He is worried that this might cause worsening sexual dysfunction. We will consider this in the future. I'm very interested to see if good treatment of his sleep apnea helps his heart rate. We discussed the idea that stresses during the day might play a role in his increased heart rate. He is on antianxiety meds. Over time I will consider exercise testing that does not appear to be necessary now. I will also consider referral to electrophysiology for further help with his sinus tachycardia if we do not see improvement. I mentioned above that his left ventricular function is normal by echo. He has no major valvular abnormalities. There is a TSH which is normal.  This part of today's evaluation I spent greater than 25 minutes with his total care. More than half of this time has been with direct counseling with the patient and his wife. We had a very extensive discussion about all of his medical problems, all of his medicines, all of the different approaches that are being considered.

## 2013-12-06 NOTE — Assessment & Plan Note (Signed)
Most recent lipids while on Vytorin 40 showing an HDL of 29 and an LDL of 98. The patient's 10 year risk score is significantly elevated. Today I'm changing him to 80 mg of atorvastatin. He will have follow-up labs in 6 weeks. If he does not reach goal of 70 on this regimen, I will add Zetia to the medications. He does not have proven coronary disease. However I feel he has significant risk including a very strong family history.

## 2013-12-06 NOTE — Patient Instructions (Addendum)
**Note De-Identified Brandon Frank Obfuscation** Your physician has recommended you make the following change in your medication: stop taking Vytorin and start taking Lipitor 80 mg daily  Dr [name] is referring you to a Sleep specialist *soon* for sleep assessment and evaluation of sleep apnea  Your physician recommends that you schedule a follow-up appointment in: 8 weeks

## 2013-12-06 NOTE — Progress Notes (Signed)
HPI Patient is seen today to follow-up the evaluation of sinus tachycardia. I saw him for complete evaluation October 27, 2013. At that time I knew that he had an upcoming sleep study scheduled. He wore a 48-hour Holter monitor. Heart rate ranged from 70-128. The mean heart rate during the day was 95. Mean heart rate during the resting hours was 85. He also had a two-dimensional echo. Ejection fraction was normal at 60% with normal wall motion. There is question of abnormal diastolic parameters. However his left atrial size was normal. This argues against significant diastolic dysfunction. The patient is able to monitor his heart rate through his cell phone. At home he has a rate at nighttime as low as 65. During the daytime however without feeling his heart rate increase, he has increases as high as 140. I also reviewed the heart rates during his sleep study. He wore the mask most of the night. During this time, his heart rate averaged in the range of 75. At this point, the etiology of his increased heart rate is still not clear.  The patient has been seen by ENT. Sleep study was ordered by ENT. It was also recommended that he have a procedure to help with his nasal septum. He says that this was done. The sleep study shows severe sleep apnea. I made it clear that we need to arrange for pulmonary assessment of his sleep study and his sleep apnea to give him formal recommendations on the best approach to therapy.    No Known Allergies  Current Outpatient Prescriptions  Medication Sig Dispense Refill  . ANDROGEL PUMP 20.25 MG/ACT (1.62%) GEL 1 pump to both upper arm area twice daily (total 4 pumps daily) per Dr Dustin Flock    . CIALIS 5 MG tablet Take 5 mg by mouth daily as needed.     . clobetasol (TEMOVATE) 0.05 % cream Apply 1 application topically 2 (two) times daily.      . DULoxetine (CYMBALTA) 60 MG capsule Take 150 mg by mouth daily. Per Dr Candis Schatz    . fish oil-omega-3 fatty acids  1000 MG capsule Take 1 g by mouth daily.     . fluticasone (FLONASE) 50 MCG/ACT nasal spray USE ONE SPRAY IN EACH NOSTRIL ONCE DAILY. 16 g 3  . glucosamine-chondroitin 500-400 MG tablet Take 1 tablet by mouth daily.     . hydrOXYzine (ATARAX) 10 MG tablet Take 10 mg by mouth daily. Take 2    . LORazepam (ATIVAN) 0.5 MG tablet Take 1 tablet (0.5 mg total) by mouth as needed for anxiety. 30 tablet 5  . metFORMIN (GLUCOPHAGE) 500 MG tablet Take 1 tablet (500 mg total) by mouth 2 (two) times daily with a meal. 180 tablet 3  . Multiple Vitamin (MULTIVITAMIN) tablet Take 1 tablet by mouth daily.      . Psyllium (METAMUCIL) 30.9 % POWD Take 1 packet by mouth daily.      [name] zolpidem (AMBIEN) 10 MG tablet TAKE ONE TABLET BY MOUTH AT BEDTIME AS NEEDED FOR SLEEP 30 tablet 2  . atorvastatin (LIPITOR) 80 MG tablet Take 1 tablet (80 mg total) by mouth daily. 90 tablet 3  . [DISCONTINUED] paroxetine mesylate (PEXEVA) 20 MG tablet Take 1 tablet (20 mg total) by mouth every morning. 60 tablet 3   No current facility-administered medications for this visit.    History   Social History  . Marital Status: Married    Spouse Name: N/A    Number  of Children: N/A  . Years of Education: N/A   Occupational History  . Not on file.   Social History Main Topics  . Smoking status: Former Smoker    Quit date: 03/20/1975  . Smokeless tobacco: Never Used  . Alcohol Use: 1.2 oz/week    2 Cans of beer per week     Comment: daily  . Drug Use: No  . Sexual Activity: Not on file   Other Topics Concern  . Not on file   Social History Narrative    Family History  Problem Relation Age of Onset  . Heart disease Father 16    MI  . Hyperlipidemia Father   . Hypertension Father   . Diabetes Brother   . Hypertension Brother   . Hyperlipidemia Brother   . Diabetes Paternal Grandfather     Past Medical History  Diagnosis Date  . Hyperlipidemia   . Chronic low back pain   . Ejection fraction     Past  Surgical History  Procedure Laterality Date  . Hernia repair    . Rotator cuff surgery  2012    Dr. [name]  . Prostate biopsy N/A     Patient Active Problem List   Diagnosis Date Noted  . Snoring 10/27/2013  . Sinus tachycardia 10/27/2013  . Chest pressure 10/25/2013  . Ejection fraction   . Plantar fasciitis of right foot 10/21/2012  . Type 2 diabetes mellitus, controlled 04/14/2012  . Anxiety 02/03/2011  . Overweight 10/23/2008  . PERIPHERAL NEUROPATHY 12/06/2006  . INSOMNIA 12/06/2006  . HYPERGLYCEMIA 12/06/2006  . TRANSAMINASES, SERUM, ELEVATED 11/30/2005  . Hyperlipidemia 11/29/2005    ROS  Patient denies fever, chills, headache, sweats, rash, change in vision, change in hearing, chest pain, cough, nausea or vomiting, urinary symptoms. All other systems are reviewed and are negative other than the history of present illness.  PHYSICAL EXAM Patient is oriented to person time and place. Affect is normal. He is here with his wife ( who works with our Engineer, civil (consulting) in our office). Head is atraumatic. Sclera and conjunctiva are normal. There is no jugular venous distention. Lungs are clear. Respiratory effort is not labored. Cardiac exam reveals an S1 with an S2. His heart rate today in the office is 97. Abdomen is soft. There is no peripheral edema. There are no musculoskeletal deformities. There are no skin rashes.  Filed Vitals:   12/06/13 0823  BP: 140/82  Pulse: 95  Height: 5\' 10"  (1.778 m)  Weight: 214 lb 3.2 oz (97.16 kg)  SpO2: 96%     ASSESSMENT & PLAN

## 2013-12-06 NOTE — Telephone Encounter (Signed)
New problem   Pt know about his r/s appt for tomorrow.

## 2013-12-07 ENCOUNTER — Ambulatory Visit (INDEPENDENT_AMBULATORY_CARE_PROVIDER_SITE_OTHER): Payer: BC Managed Care – PPO | Admitting: Internal Medicine

## 2013-12-07 ENCOUNTER — Encounter: Payer: Self-pay | Admitting: Internal Medicine

## 2013-12-07 VITALS — BP 168/80 | HR 96 | Ht 70.0 in | Wt 219.6 lb

## 2013-12-07 DIAGNOSIS — G4700 Insomnia, unspecified: Secondary | ICD-10-CM

## 2013-12-07 DIAGNOSIS — G4733 Obstructive sleep apnea (adult) (pediatric): Secondary | ICD-10-CM

## 2013-12-07 DIAGNOSIS — I471 Supraventricular tachycardia: Secondary | ICD-10-CM

## 2013-12-07 DIAGNOSIS — R000 Tachycardia, unspecified: Secondary | ICD-10-CM

## 2013-12-07 NOTE — Patient Instructions (Signed)
Order- new DME new CPAP 12, mask of choice, humidifier, supplies dx OSA  Please call as needed

## 2013-12-07 NOTE — Assessment & Plan Note (Signed)
Severe obstructive sleep apnea with underlying chronic insomnia. 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 We discussed the physiology, medical concerns and treatment.  Plan- Start CPAP autotitration for pressure recommendation.

## 2013-12-07 NOTE — Assessment & Plan Note (Signed)
Understands role of job stress. We can reassess role of ambien once we see how he does with CPAP. Basic sleep hygiene.

## 2013-12-07 NOTE — Assessment & Plan Note (Signed)
May reflect his self-reported stress.  Plan- see if this is impacted by CPAP

## 2013-12-07 NOTE — Progress Notes (Signed)
12/07/13- 58 YO M Former smoker referred for sleep medicine evaluation courtesy of Dr Campbell Lerner sleep study 11-14-13 at [facility]. NPSG 11/14/13- Severe OSA, AHI 86/ hr with loud snore, desat ot 80%, titrated to CPAP 12, weight 215 lbs Chronic tendency to insomnia. Over the past year wife has been telling him of louder snore, witnessed apneas, and he admits daytime fatigue attributed to job stress disturbing sleep. Bedtime 11PM, latency with ambien 30-60 min, waking 1-2 for bathroom, up 6:30 AM Cardiology eval for tachycardia. Hx calcified lymph nodes in chest with Neg PPD. DM2. Septoplasty/ Dr Benjamine Mola. Family snored. Father died 69yo heart. 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   Prior to Admission medications   Medication Sig Start Date End Date Taking? Authorizing Provider  ANDROGEL PUMP 20.25 MG/ACT (1.62%) GEL 1 pump to both upper arm area twice daily (total 4 pumps daily) per Dr Dustin Flock 04/06/12  Yes Historical Provider, MD  atorvastatin (LIPITOR) 80 MG tablet Take 1 tablet (80 mg total) by mouth daily. 12/06/13  Yes Carlena Bjornstad, MD  DULoxetine (CYMBALTA) 60 MG capsule Take 150 mg by mouth daily. Per Dr Candis Schatz 04/06/12  Yes Historical Provider, MD  fish oil-omega-3 fatty acids 1000 MG capsule Take 1 g by mouth daily.    Yes Historical Provider, MD  fluticasone (FLONASE) 50 MCG/ACT nasal spray USE ONE SPRAY IN EACH NOSTRIL ONCE DAILY.   Yes [name], MD  glucosamine-chondroitin 500-400 MG tablet Take 1 tablet by mouth daily.    Yes Historical Provider, MD  hydrOXYzine (ATARAX) 10 MG tablet Take 10 mg by mouth daily.    Yes Historical Provider, MD  LORazepam (ATIVAN) 0.5 MG tablet Take 1 tablet (0.5 mg total) by mouth as needed for anxiety. 10/20/11  Yes [name], MD  metFORMIN (GLUCOPHAGE) 500 MG tablet Take 1 tablet (500 mg total) by mouth 2 (two) times daily with a meal. 09/28/13  Yes [name], MD  Multiple  Vitamin (MULTIVITAMIN) tablet Take 1 tablet by mouth daily.     Yes Historical Provider, MD  Psyllium (METAMUCIL) 30.9 % POWD Take 1 packet by mouth daily.     Yes Historical Provider, MD  zolpidem (AMBIEN) 10 MG tablet TAKE ONE TABLET BY MOUTH AT BEDTIME AS NEEDED FOR SLEEP 04/04/12  Yes [name], MD  CIALIS 5 MG tablet Take 5 mg by mouth daily as needed.  10/23/13   Historical Provider, MD   Past Medical History  Diagnosis Date  . Hyperlipidemia   . Chronic low back pain   . Ejection fraction    Past Surgical History  Procedure Laterality Date  . Hernia repair    . Rotator cuff surgery  2012    Dr. [name]  . Prostate biopsy N/A    Family History  Problem Relation Age of Onset  . Heart disease Father 52    MI  . Hyperlipidemia Father   . Hypertension Father   . Diabetes Brother   . Hypertension Brother   . Hyperlipidemia Brother   . Diabetes Paternal Grandfather    History   Social History  . Marital Status: Married    Spouse Name: N/A    Number of Children: N/A  . Years of Education: N/A   Occupational History  . Not on file.   Social History Main Topics  . Smoking status: Former Smoker    Quit date: 03/20/1975  . Smokeless tobacco: Never Used  .  Alcohol Use: 1.2 oz/week    2 Cans of beer per week     Comment: daily  . Drug Use: No  . Sexual Activity: Not on file   Other Topics Concern  . Not on file   Social History Narrative   ROS-see HPI Constitutional:   No-   weight loss, night sweats, fevers, chills, +fatigue, lassitude. HEENT:   No-  headaches, difficulty swallowing, tooth/dental problems, sore throat,       No-  sneezing, itching, ear ache, nasal congestion, post nasal drip,  CV:  No-   chest pain, orthopnea, PND, swelling in lower extremities, anasarca,                                  dizziness, palpitations Resp: No-   shortness of breath with exertion or at rest.              No-   productive cough,  No non-productive cough,  No-  coughing up of blood.              No-   change in color of mucus.  No- wheezing.   Skin: No-   rash or lesions. GI:  No-   heartburn, indigestion, abdominal pain, nausea, vomiting, diarrhea,                 change in bowel habits, loss of appetite GU: No-   dysuria, change in color of urine, no urgency or frequency.  No- flank pain. MS:  No-   joint pain or swelling.  No- decreased range of motion.  No- back pain. Neuro-     nothing unusual Psych:  No- change in mood or affect. No depression or +anxiety.  No memory loss.  OBJ- Physical Exam General- Alert, Oriented, Affect-appropriate, Distress- none acute Skin- rash-none, lesions- none, excoriation- none Lymphadenopathy- none Head- atraumatic            Eyes- Gross vision intact, PERRLA, conjunctivae and secretions clear            Ears- Hearing, canals-normal            Nose- Clear, no-Septal dev, mucus, polyps, erosion, perforation             Throat- Mallampati II , mucosa clear , drainage- none, tonsils- atrophic Neck- flexible , trachea midline, no stridor , thyroid nl, carotid no bruit Chest - symmetrical excursion , unlabored           Heart/CV- RRR , no murmur , no gallop  , no rub, nl s1 s2                           - JVD- none , edema- none, stasis changes- none, varices- none           Lung- clear to P&A, wheeze- none, cough- none , dullness-none, rub- none           Chest wall-  Abd- tender-no, distended-no, bowel sounds-present, HSM- no Br/ Gen/ Rectal- Not done, not indicated Extrem- cyanosis- none, clubbing, none, atrophy- none, strength- nl Neuro- grossly intact to observation

## 2014-01-17 ENCOUNTER — Other Ambulatory Visit (INDEPENDENT_AMBULATORY_CARE_PROVIDER_SITE_OTHER): Payer: BC Managed Care – PPO | Admitting: *Deleted

## 2014-01-17 ENCOUNTER — Institutional Professional Consult (permissible substitution): Payer: BC Managed Care – PPO | Admitting: Pulmonary Disease

## 2014-01-17 DIAGNOSIS — E785 Hyperlipidemia, unspecified: Secondary | ICD-10-CM

## 2014-01-17 LAB — LIPID PANEL
CHOL/HDL RATIO: 5
Cholesterol: 154 mg/dL (ref 0–200)
HDL: 30 mg/dL — ABNORMAL LOW (ref >39.00)
LDL Cholesterol: 101 mg/dL — ABNORMAL HIGH (ref 0–99)
NONHDL: 124
Triglycerides: 117 mg/dL (ref 0.0–149.0)
VLDL: 23.4 mg/dL (ref 0.0–40.0)

## 2014-01-17 LAB — HEPATIC FUNCTION PANEL
ALT: 33 U/L (ref 0–53)
AST: 29 U/L (ref 0–37)
Albumin: 4.1 g/dL (ref 3.5–5.2)
Alkaline Phosphatase: 49 U/L (ref 39–117)
Bilirubin, Direct: 0.1 mg/dL (ref 0.0–0.3)
Total Bilirubin: 0.9 mg/dL (ref 0.2–1.2)
Total Protein: 6.8 g/dL (ref 6.0–8.3)

## 2014-01-29 ENCOUNTER — Ambulatory Visit: Payer: BC Managed Care – PPO | Admitting: Cardiology

## 2014-02-06 ENCOUNTER — Ambulatory Visit: Payer: BC Managed Care – PPO | Admitting: Internal Medicine

## 2014-02-19 ENCOUNTER — Ambulatory Visit (INDEPENDENT_AMBULATORY_CARE_PROVIDER_SITE_OTHER): Payer: BLUE CROSS/BLUE SHIELD | Admitting: Cardiology

## 2014-02-19 ENCOUNTER — Encounter: Payer: Self-pay | Admitting: Cardiology

## 2014-02-19 VITALS — BP 126/80 | HR 92 | Ht 70.0 in | Wt 225.8 lb

## 2014-02-19 DIAGNOSIS — G4733 Obstructive sleep apnea (adult) (pediatric): Secondary | ICD-10-CM

## 2014-02-19 DIAGNOSIS — R000 Tachycardia, unspecified: Secondary | ICD-10-CM

## 2014-02-19 DIAGNOSIS — E119 Type 2 diabetes mellitus without complications: Secondary | ICD-10-CM

## 2014-02-19 DIAGNOSIS — I471 Supraventricular tachycardia: Secondary | ICD-10-CM

## 2014-02-19 DIAGNOSIS — R0789 Other chest pain: Secondary | ICD-10-CM

## 2014-02-19 DIAGNOSIS — E785 Hyperlipidemia, unspecified: Secondary | ICD-10-CM

## 2014-02-19 MED ORDER — EZETIMIBE 10 MG PO TABS: 10.0000 mg | ORAL_TABLET | Freq: Every day | ORAL | Status: DC

## 2014-02-19 NOTE — Assessment & Plan Note (Signed)
He is doing well with his C Pap.

## 2014-02-19 NOTE — Progress Notes (Signed)
HPI Patient is seen today for follow-up sinus tachycardia. I saw him last December 06, 2013. Instead time he's had complete assessment of his sleep apnea. He has severe sleep apnea. He is using C Pap. He is definitely sleeping much better. He's feeling well. He is able to record his heart rate on his smart phone. His heart rate clearly drops into the 70-80 range at nighttime when he's home resting. It does increase during the day but it is much improved from previous numbers. He had held his Cialis for a period of time to see if this affected his heart rate. It did not appear to affect his heart rate. He is now using Cialis on a schedule during the week to help with prostate. We never tried a beta blocker.  Allergies  Allergen Reactions  . Other     Pain Meds----make him itch but tolerable    Current Outpatient Prescriptions  Medication Sig Dispense Refill  . ANDROGEL PUMP 20.25 MG/ACT (1.62%) GEL 1 pump to both upper arm area twice daily (total 4 pumps daily) per Dr Dustin Flock    . atorvastatin (LIPITOR) 80 MG tablet Take 1 tablet (80 mg total) by mouth daily. 90 tablet 3  . CIALIS 5 MG tablet Take 5 mg by mouth daily as needed.     . DULoxetine (CYMBALTA) 60 MG capsule Take 150 mg by mouth daily. Per Dr Candis Schatz    . fluticasone (FLONASE) 50 MCG/ACT nasal spray USE ONE SPRAY IN EACH NOSTRIL ONCE DAILY. 16 g 3  . glucosamine-chondroitin 500-400 MG tablet Take 1 tablet by mouth daily.     . hydrOXYzine (ATARAX) 10 MG tablet Take 10 mg by mouth daily.     [name] LORazepam (ATIVAN) 0.5 MG tablet Take 1 tablet (0.5 mg total) by mouth as needed for anxiety. 30 tablet 5  . metFORMIN (GLUCOPHAGE) 500 MG tablet Take 1 tablet (500 mg total) by mouth 2 (two) times daily with a meal. 180 tablet 3  . Multiple Vitamin (MULTIVITAMIN) tablet Take 1 tablet by mouth daily.      . Psyllium (METAMUCIL) 30.9 % POWD Take 1 packet by mouth daily.      [name] zolpidem (AMBIEN) 10 MG tablet TAKE ONE TABLET BY  MOUTH AT BEDTIME AS NEEDED FOR SLEEP 30 tablet 2  . ezetimibe (ZETIA) 10 MG tablet Take 1 tablet (10 mg total) by mouth daily. 30 tablet 6  . [DISCONTINUED] paroxetine mesylate (PEXEVA) 20 MG tablet Take 1 tablet (20 mg total) by mouth every morning. 60 tablet 3   No current facility-administered medications for this visit.    History   Social History  . Marital Status: Married    Spouse Name: N/A    Number of Children: N/A  . Years of Education: N/A   Occupational History  . Not on file.   Social History Main Topics  . Smoking status: Former Smoker    Quit date: 03/20/1975  . Smokeless tobacco: Never Used  . Alcohol Use: 1.2 oz/week    2 Cans of beer per week     Comment: daily  . Drug Use: No  . Sexual Activity: Not on file   Other Topics Concern  . Not on file   Social History Narrative    Family History  Problem Relation Age of Onset  . Heart disease Father 74    MI  . Hyperlipidemia Father   . Hypertension Father   . Diabetes Brother   . Hypertension  Brother   . Hyperlipidemia Brother   . Diabetes Paternal Grandfather     Past Medical History  Diagnosis Date  . Hyperlipidemia   . Chronic low back pain   . Ejection fraction     Past Surgical History  Procedure Laterality Date  . Hernia repair    . Rotator cuff surgery  2012    Dr. [name]  . Prostate biopsy N/A     Patient Active Problem List   Diagnosis Date Noted  . Obstructive sleep apnea 12/06/2013  . Sinus tachycardia 10/27/2013  . Chest pressure 10/25/2013  . Ejection fraction   . Plantar fasciitis of right foot 10/21/2012  . Type 2 diabetes mellitus, controlled 04/14/2012  . Anxiety 02/03/2011  . Overweight 10/23/2008  . PERIPHERAL NEUROPATHY 12/06/2006  . Insomnia 12/06/2006  . HYPERGLYCEMIA 12/06/2006  . TRANSAMINASES, SERUM, ELEVATED 11/30/2005  . Hyperlipidemia 11/29/2005    ROS    patient denies fever, chills, headache, sweats, rash, change in vision, change in hearing,  chest pain, cough, nausea or vomiting, urinary symptoms. All other systems are reviewed and are negative.  PHYSICAL EXAM Patient is oriented to person time and place. Affect is normal. Head is atraumatic. Sclera and conjunctiva are normal. There is no jugular venous distention. Lungs are clear. Respiratory effort is nonlabored. Cardiac exam reveals S1 and S2. Abdomen is soft. There is no peripheral edema. There are no musculoskeletal deformities. There are no skin rashes.   Filed Vitals:   02/19/14 0906  BP: 126/80  Pulse: 92  Height: 5\' 10"  (1.778 m)  Weight: 225 lb 12.8 oz (102.422 kg)  SpO2: 98%    EKG is not done today.   ASSESSMENT & PLAN

## 2014-02-19 NOTE — Assessment & Plan Note (Signed)
Because of his strong family history, I am treating his lipids aggressively with primary prevention. He is on atorvastatin 80. His LDL is still in the range of 100. I will add Zetia.

## 2014-02-19 NOTE — Assessment & Plan Note (Signed)
He has not had any chest symptoms. He does have a very strong family history of coronary disease. Nuclear study in 2010 showed no ischemia. We will consider a follow-up exercise test when I see him back

## 2014-02-19 NOTE — Assessment & Plan Note (Signed)
At this point I feel we do not need to do any further workup for his sinus tachycardia. We will continue to follow this as he does better with his C Pap.

## 2014-02-19 NOTE — Patient Instructions (Signed)
Your physician has recommended you make the following change in your medication: start taking Zetia 10 mg daily  Your physician recommends that you return for lab work in: 6 weeks. Please do not eat or drink after midnight the night before labs are drawn.  Your physician wants you to follow-up in: 6 months (August). You will receive a reminder letter in the mail two months in advance. If you don't receive a letter, please call our office to schedule the follow-up appointment.

## 2014-02-26 ENCOUNTER — Ambulatory Visit (INDEPENDENT_AMBULATORY_CARE_PROVIDER_SITE_OTHER): Payer: BLUE CROSS/BLUE SHIELD | Admitting: Internal Medicine

## 2014-02-26 ENCOUNTER — Encounter: Payer: Self-pay | Admitting: Internal Medicine

## 2014-02-26 VITALS — BP 122/78 | HR 100 | Ht 70.0 in | Wt 229.4 lb

## 2014-02-26 DIAGNOSIS — G4733 Obstructive sleep apnea (adult) (pediatric): Secondary | ICD-10-CM

## 2014-02-26 DIAGNOSIS — G4700 Insomnia, unspecified: Secondary | ICD-10-CM

## 2014-02-26 MED ORDER — ZALEPLON 5 MG PO CAPS: 5.0000 mg | ORAL_CAPSULE | Freq: Every evening | ORAL | Status: DC | PRN

## 2014-02-26 NOTE — Patient Instructions (Signed)
We can continue CPAP 12/ [facility]- please call as needed  Script for Sonata/ zaleplon  Short duration sleep med. Take one during the night if you need extra help for waking and difficulty getting back to sleep.

## 2014-02-26 NOTE — Progress Notes (Signed)
12/07/13- 58 YO M Former smoker referred for sleep medicine evaluation courtesy of Dr Campbell Lerner sleep study 11-14-13 at [facility]. NPSG 11/14/13- Severe OSA, AHI 86/ hr with loud snore, desat ot 80%, titrated to CPAP 12, weight 215 lbs Chronic tendency to insomnia. Over the past year wife has been telling him of louder snore, witnessed apneas, and he admits daytime fatigue attributed to job stress disturbing sleep. Bedtime 11PM, latency with ambien 30-60 min, waking 1-2 for bathroom, up 6:30 AM Cardiology eval for tachycardia. Hx calcified lymph nodes in chest with Neg PPD. DM2. Septoplasty/ Dr Benjamine Mola. Family snored. Father died 21yo heart. 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    02/26/14- 58 yoM followed for OSA, insomnia FOLLOWS FOR: DME [facility]- pt wears cpap 6-7 hrs nightly. Pt doing well. Download confirms good compliance and control at CPAP 12/[facility] Playing soccer recently, cracked rib-tussive rib pain. Ambien doesn't always carry him through the night. He asks help with sleep if he wakes 2 or 3 AM and can't regain sleep. We discussed sleep habits and short half-life medications.  ROS-see HPI Constitutional:   No-   weight loss, night sweats, fevers, chills, +fatigue, lassitude. HEENT:   No-  headaches, difficulty swallowing, tooth/dental problems, sore throat,       No-  sneezing, itching, ear ache, nasal congestion, post nasal drip,  CV:  +  chest pain, orthopnea, PND, swelling in lower extremities, anasarca,                                  dizziness, palpitations Resp: No-   shortness of breath with exertion or at rest.              No-   productive cough,  No non-productive cough,  No- coughing up of blood.              No-   change in color of mucus.  No- wheezing.   Skin: No-   rash or lesions. GI:  No-   heartburn, indigestion, abdominal pain, nausea, GU:  MS:  No-   joint pain or swelling.  Neuro-     nothing unusual Psych:  No-  change in mood or affect. No depression or +anxiety.  No memory loss.  OBJ- Physical Exam General- Alert, Oriented, Affect-appropriate, Distress- none acute Skin- rash-none, lesions- none, excoriation- none Lymphadenopathy- none Head- atraumatic            Eyes- Gross vision intact, PERRLA, conjunctivae and secretions clear            Ears- Hearing, canals-normal            Nose- Clear, no-Septal dev, mucus, polyps, erosion, perforation             Throat- Mallampati II , mucosa clear , drainage- none, tonsils- atrophic Neck- flexible , trachea midline, no stridor , thyroid nl, carotid no bruit Chest - symmetrical excursion , unlabored           Heart/CV- RRR , no murmur , no gallop  , no rub, nl s1 s2                           - JVD- none , edema- none, stasis changes- none, varices- none           Lung- clear to P&A, wheeze- none, cough- none ,  dullness-none, rub- none           Chest wall-  Abd- Br/ Gen/ Rectal- Not done, not indicated Extrem- cyanosis- none, clubbing, none, atrophy- none, strength- nl Neuro- grossly intact to observation

## 2014-03-04 NOTE — Assessment & Plan Note (Signed)
Reviewed his experience with Ambien and discussed good sleep habits. Plan-if he wakes during the night and can't regain sleep try zaleplon 5 mg

## 2014-03-04 NOTE — Assessment & Plan Note (Signed)
Good compliance and control at 12/[facility]. We discussed comfort measures.

## 2014-03-05 ENCOUNTER — Encounter: Payer: Self-pay | Admitting: Family Medicine

## 2014-03-05 ENCOUNTER — Ambulatory Visit (INDEPENDENT_AMBULATORY_CARE_PROVIDER_SITE_OTHER): Payer: BLUE CROSS/BLUE SHIELD | Admitting: Family Medicine

## 2014-03-05 VITALS — BP 130/78 | HR 92 | Temp 98.0°F | Wt 227.0 lb

## 2014-03-05 DIAGNOSIS — M5416 Radiculopathy, lumbar region: Secondary | ICD-10-CM

## 2014-03-05 DIAGNOSIS — E119 Type 2 diabetes mellitus without complications: Secondary | ICD-10-CM

## 2014-03-05 DIAGNOSIS — M5417 Radiculopathy, lumbosacral region: Secondary | ICD-10-CM

## 2014-03-05 MED ORDER — PREDNISONE 10 MG PO TABS: ORAL_TABLET | ORAL | Status: DC

## 2014-03-05 NOTE — Patient Instructions (Signed)
Lumbosacral Radiculopathy Lumbosacral radiculopathy is a pinched nerve or nerves in the low back (lumbosacral area). When this happens you may have weakness in your legs and may not be able to stand on your toes. You may have pain going down into your legs. There may be difficulties with walking normally. There are many causes of this problem. Sometimes this may happen from an injury, or simply from arthritis or boney problems. It may also be caused by other illnesses such as diabetes. If there is no improvement after treatment, further studies may be done to find the exact cause. DIAGNOSIS  X-rays may be needed if the problems become long standing. Electromyograms may be done. This study is one in which the working of nerves and muscles is studied. HOME CARE INSTRUCTIONS   Applications of ice packs may be helpful. Ice can be used in a plastic bag with a towel around it to prevent frostbite to skin. This may be used every 2 hours for 20 to 30 minutes, or as needed, while awake, or as directed by your caregiver.  Only take over-the-counter or prescription medicines for pain, discomfort, or fever as directed by your caregiver.  If physical therapy was prescribed, follow your caregiver's directions. SEEK IMMEDIATE MEDICAL CARE IF:   You have pain not controlled with medications.  You seem to be getting worse rather than better.  You develop increasing weakness in your legs.  You develop loss of bowel or bladder control.  You have difficulty with walking or balance, or develop clumsiness in the use of your legs.  You have a fever. MAKE SURE YOU:   Understand these instructions.  Will watch your condition.  Will get help right away if you are not doing well or get worse. Document Released: 01/05/2005 Document Revised: 03/30/2011 Document Reviewed: 08/26/2007 [facility] Patient Information 2015 [place], [place]. This information is not intended to replace advice given to you by your health  care provider. Make sure you discuss any questions you have with your health care provider.  Low Back Sprain with Rehab  A sprain is an injury in which a ligament is torn. The ligaments of the lower back are vulnerable to sprains. However, they are strong and require great force to be injured. These ligaments are important for stabilizing the spinal column. Sprains are classified into three categories. Grade 1 sprains cause pain, but the tendon is not lengthened. Grade 2 sprains include a lengthened ligament, due to the ligament being stretched or partially ruptured. With grade 2 sprains there is still function, although the function may be decreased. Grade 3 sprains involve a complete tear of the tendon or muscle, and function is usually impaired. SYMPTOMS  Severe pain in the lower back. Sometimes, a feeling of a "pop," "snap," or tear, at the time of injury. Tenderness and sometimes swelling at the injury site. Uncommonly, bruising (contusion) within 48 hours of injury. Muscle spasms in the back. CAUSES  Low back sprains occur when a force is placed on the ligaments that is greater than they can handle. Common causes of injury include: Performing a stressful act while off-balance. Repetitive stressful activities that involve movement of the lower back. Direct hit (trauma) to the lower back. RISK INCREASES WITH: Contact sports (football, wrestling). Collisions (major skiing accidents). Sports that require throwing or lifting (baseball, weightlifting). Sports involving twisting of the spine (gymnastics, diving, tennis, golf). Poor strength and flexibility. Inadequate protection. Previous back injury or surgery (especially fusion). PREVENTION Wear properly fitted and padded protective  equipment. Warm up and stretch properly before activity. Allow for adequate recovery between workouts. Maintain physical fitness: Strength, flexibility, and endurance. Cardiovascular fitness. Maintain a  healthy body weight. PROGNOSIS  If treated properly, low back sprains usually heal with non-surgical treatment. The length of time for healing depends on the severity of the injury.  RELATED COMPLICATIONS  Recurring symptoms, resulting in a chronic problem. Chronic inflammation and pain in the low back. Delayed healing or resolution of symptoms, especially if activity is resumed too soon. Prolonged impairment. Unstable or arthritic joints of the low back. TREATMENT  Treatment first involves the use of ice and medicine, to reduce pain and inflammation. The use of strengthening and stretching exercises may help reduce pain with activity. These exercises may be performed at home or with a therapist. Severe injuries may require referral to a therapist for further evaluation and treatment, such as ultrasound. Your caregiver may advise that you wear a back brace or corset, to help reduce pain and discomfort. Often, prolonged bed rest results in greater harm then benefit. Corticosteroid injections may be recommended. However, these should be reserved for the most serious cases. It is important to avoid using your back when lifting objects. At night, sleep on your back on a firm mattress, with a pillow placed under your knees. If non-surgical treatment is unsuccessful, surgery may be needed.  MEDICATION  If pain medicine is needed, nonsteroidal anti-inflammatory medicines (aspirin and ibuprofen), or other minor pain relievers (acetaminophen), are often advised. Do not take pain medicine for 7 days before surgery. Prescription pain relievers may be given, if your caregiver thinks they are needed. Use only as directed and only as much as you need. Ointments applied to the skin may be helpful. Corticosteroid injections may be given by your caregiver. These injections should be reserved for the most serious cases, because they may only be given a certain number of times. HEAT AND COLD Cold treatment (icing)  should be applied for 10 to 15 minutes every 2 to 3 hours for inflammation and pain, and immediately after activity that aggravates your symptoms. Use ice packs or an ice massage. Heat treatment may be used before performing stretching and strengthening activities prescribed by your caregiver, physical therapist, or athletic trainer. Use a heat pack or a warm water soak. SEEK MEDICAL CARE IF:  Symptoms get worse or do not improve in 2 to 4 weeks, despite treatment. You develop numbness or weakness in either leg. You lose bowel or bladder function. Any of the following occur after surgery: fever, increased pain, swelling, redness, drainage of fluids, or bleeding in the affected area. New, unexplained symptoms develop. (Drugs used in treatment may produce side effects.) EXERCISES  RANGE OF MOTION (ROM) AND STRETCHING EXERCISES - Low Back Sprain Most people with lower back pain will find that their symptoms get worse with excessive bending forward (flexion) or arching at the lower back (extension). The exercises that will help resolve your symptoms will focus on the opposite motion.  Your physician, physical therapist or athletic trainer will help you determine which exercises will be most helpful to resolve your lower back pain. Do not complete any exercises without first consulting with your caregiver. Discontinue any exercises which make your symptoms worse, until you speak to your caregiver. If you have pain, numbness or tingling which travels down into your buttocks, leg or foot, the goal of the therapy is for these symptoms to move closer to your back and eventually resolve. Sometimes, these leg symptoms  will get better, but your lower back pain may worsen. This is often an indication of progress in your rehabilitation. Be very alert to any changes in your symptoms and the activities in which you participated in the 24 hours prior to the change. Sharing this information with your caregiver will allow  him or her to most efficiently treat your condition. These exercises may help you when beginning to rehabilitate your injury. Your symptoms may resolve with or without further involvement from your physician, physical therapist or athletic trainer. While completing these exercises, remember:  Restoring tissue flexibility helps normal motion to return to the joints. This allows healthier, less painful movement and activity. An effective stretch should be held for at least 30 seconds. A stretch should never be painful. You should only feel a gentle lengthening or release in the stretched tissue. FLEXION RANGE OF MOTION AND STRETCHING EXERCISES: STRETCH - Flexion, Single Knee to Chest  Lie on a firm bed or floor with both legs extended in front of you. Keeping one leg in contact with the floor, bring your opposite knee to your chest. Hold your leg in place by either grabbing behind your thigh or at your knee. Pull until you feel a gentle stretch in your low back. Hold __________ seconds. Slowly release your grasp and repeat the exercise with the opposite side. Repeat __________ times. Complete this exercise __________ times per day.  STRETCH - Flexion, Double Knee to Chest Lie on a firm bed or floor with both legs extended in front of you. Keeping one leg in contact with the floor, bring your opposite knee to your chest. Tense your stomach muscles to support your back and then lift your other knee to your chest. Hold your legs in place by either grabbing behind your thighs or at your knees. Pull both knees toward your chest until you feel a gentle stretch in your low back. Hold __________ seconds. Tense your stomach muscles and slowly return one leg at a time to the floor. Repeat __________ times. Complete this exercise __________ times per day.  STRETCH - Low Trunk Rotation Lie on a firm bed or floor. Keeping your legs in front of you, bend your knees so they are both pointed toward the ceiling and  your feet are flat on the floor. Extend your arms out to the side. This will stabilize your upper body by keeping your shoulders in contact with the floor. Gently and slowly drop both knees together to one side until you feel a gentle stretch in your low back. Hold for __________ seconds. Tense your stomach muscles to support your lower back as you bring your knees back to the starting position. Repeat the exercise to the other side. Repeat __________ times. Complete this exercise __________ times per day  EXTENSION RANGE OF MOTION AND FLEXIBILITY EXERCISES: STRETCH - Extension, Prone on Elbows  Lie on your stomach on the floor, a bed will be too soft. Place your palms about shoulder width apart and at the height of your head. Place your elbows under your shoulders. If this is too painful, stack pillows under your chest. Allow your body to relax so that your hips drop lower and make contact more completely with the floor. Hold this position for __________ seconds. Slowly return to lying flat on the floor. Repeat __________ times. Complete this exercise __________ times per day.  RANGE OF MOTION - Extension, Prone Press Ups Lie on your stomach on the floor, a bed will be too soft.  Place your palms about shoulder width apart and at the height of your head. Keeping your back as relaxed as possible, slowly straighten your elbows while keeping your hips on the floor. You may adjust the placement of your hands to maximize your comfort. As you gain motion, your hands will come more underneath your shoulders. Hold this position __________ seconds. Slowly return to lying flat on the floor. Repeat __________ times. Complete this exercise __________ times per day.  RANGE OF MOTION- Quadruped, Neutral Spine  Assume a hands and knees position on a firm surface. Keep your hands under your shoulders and your knees under your hips. You may place padding under your knees for comfort. Drop your head and point  your tailbone toward the ground below you. This will round out your lower back like an angry cat. Hold this position for __________ seconds. Slowly lift your head and release your tail bone so that your back sags into a large arch, like an old horse. Hold this position for __________ seconds. Repeat this until you feel limber in your low back. Now, find your "sweet spot." This will be the most comfortable position somewhere between the two previous positions. This is your neutral spine. Once you have found this position, tense your stomach muscles to support your low back. Hold this position for __________ seconds. Repeat __________ times. Complete this exercise __________ times per day.  STRENGTHENING EXERCISES - Low Back Sprain These exercises may help you when beginning to rehabilitate your injury. These exercises should be done near your "sweet spot." This is the neutral, low-back arch, somewhere between fully rounded and fully arched, that is your least painful position. When performed in this safe range of motion, these exercises can be used for people who have either a flexion or extension based injury. These exercises may resolve your symptoms with or without further involvement from your physician, physical therapist or athletic trainer. While completing these exercises, remember:  Muscles can gain both the endurance and the strength needed for everyday activities through controlled exercises. Complete these exercises as instructed by your physician, physical therapist or athletic trainer. Increase the resistance and repetitions only as guided. You may experience muscle soreness or fatigue, but the pain or discomfort you are trying to eliminate should never worsen during these exercises. If this pain does worsen, stop and make certain you are following the directions exactly. If the pain is still present after adjustments, discontinue the exercise until you can discuss the trouble with your  caregiver. STRENGTHENING - Deep Abdominals, Pelvic Tilt  Lie on a firm bed or floor. Keeping your legs in front of you, bend your knees so they are both pointed toward the ceiling and your feet are flat on the floor. Tense your lower abdominal muscles to press your low back into the floor. This motion will rotate your pelvis so that your tail bone is scooping upwards rather than pointing at your feet or into the floor. With a gentle tension and even breathing, hold this position for __________ seconds. Repeat __________ times. Complete this exercise __________ times per day.  STRENGTHENING - Abdominals, Crunches  Lie on a firm bed or floor. Keeping your legs in front of you, bend your knees so they are both pointed toward the ceiling and your feet are flat on the floor. Cross your arms over your chest. Slightly tip your chin down without bending your neck. Tense your abdominals and slowly lift your trunk high enough to just clear your shoulder  blades. Lifting higher can put excessive stress on the lower back and does not further strengthen your abdominal muscles. Control your return to the starting position. Repeat __________ times. Complete this exercise __________ times per day.  STRENGTHENING - Quadruped, Opposite UE/LE Lift  Assume a hands and knees position on a firm surface. Keep your hands under your shoulders and your knees under your hips. You may place padding under your knees for comfort. Find your neutral spine and gently tense your abdominal muscles so that you can maintain this position. Your shoulders and hips should form a rectangle that is parallel with the floor and is not twisted. Keeping your trunk steady, lift your right hand no higher than your shoulder and then your left leg no higher than your hip. Make sure you are not holding your breath. Hold this position for __________ seconds. Continuing to keep your abdominal muscles tense and your back steady, slowly return to your  starting position. Repeat with the opposite arm and leg. Repeat __________ times. Complete this exercise __________ times per day.  STRENGTHENING - Abdominals and Quadriceps, Straight Leg Raise  Lie on a firm bed or floor with both legs extended in front of you. Keeping one leg in contact with the floor, bend the other knee so that your foot can rest flat on the floor. Find your neutral spine, and tense your abdominal muscles to maintain your spinal position throughout the exercise. Slowly lift your straight leg off the floor about 6 inches for a count of 15, making sure to not hold your breath. Still keeping your neutral spine, slowly lower your leg all the way to the floor. Repeat this exercise with each leg __________ times. Complete this exercise __________ times per day. POSTURE AND BODY MECHANICS CONSIDERATIONS - Low Back Sprain Keeping correct posture when sitting, standing or completing your activities will reduce the stress put on different body tissues, allowing injured tissues a chance to heal and limiting painful experiences. The following are general guidelines for improved posture. Your physician or physical therapist will provide you with any instructions specific to your needs. While reading these guidelines, remember: The exercises prescribed by your provider will help you have the flexibility and strength to maintain correct postures. The correct posture provides the best environment for your joints to work. All of your joints have less wear and tear when properly supported by a spine with good posture. This means you will experience a healthier, less painful body. Correct posture must be practiced with all of your activities, especially prolonged sitting and standing. Correct posture is as important when doing repetitive low-stress activities (typing) as it is when doing a single heavy-load activity (lifting). RESTING POSITIONS Consider which positions are most painful for you when  choosing a resting position. If you have pain with flexion-based activities (sitting, bending, stooping, squatting), choose a position that allows you to rest in a less flexed posture. You would want to avoid curling into a fetal position on your side. If your pain worsens with extension-based activities (prolonged standing, working overhead), avoid resting in an extended position such as sleeping on your stomach. Most people will find more comfort when they rest with their spine in a more neutral position, neither too rounded nor too arched. Lying on a non-sagging bed on your side with a pillow between your knees, or on your back with a pillow under your knees will often provide some relief. Keep in mind, being in any one position for a prolonged period  of time, no matter how correct your posture, can still lead to stiffness. PROPER SITTING POSTURE In order to minimize stress and discomfort on your spine, you must sit with correct posture. Sitting with good posture should be effortless for a healthy body. Returning to good posture is a gradual process. Many people can work toward this most comfortably by using various supports until they have the flexibility and strength to maintain this posture on their own. When sitting with proper posture, your ears will fall over your shoulders and your shoulders will fall over your hips. You should use the back of the chair to support your upper back. Your lower back will be in a neutral position, just slightly arched. You may place a small pillow or folded towel at the base of your lower back for  support.  When working at a desk, create an environment that supports good, upright posture. Without extra support, muscles tire, which leads to excessive strain on joints and other tissues. Keep these recommendations in mind: CHAIR: A chair should be able to slide under your desk when your back makes contact with the back of the chair. This allows you to work closely. The  chair's height should allow your eyes to be level with the upper part of your monitor and your hands to be slightly lower than your elbows. BODY POSITION Your feet should make contact with the floor. If this is not possible, use a foot rest. Keep your ears over your shoulders. This will reduce stress on your neck and low back. INCORRECT SITTING POSTURES  If you are feeling tired and unable to assume a healthy sitting posture, do not slouch or slump. This puts excessive strain on your back tissues, causing more damage and pain. Healthier options include: Using more support, like a lumbar pillow. Switching tasks to something that requires you to be upright or walking. Talking a brief walk. Lying down to rest in a neutral-spine position. PROLONGED STANDING WHILE SLIGHTLY LEANING FORWARD  When completing a task that requires you to lean forward while standing in one place for a long time, place either foot up on a stationary 2-4 inch high object to help maintain the best posture. When both feet are on the ground, the lower back tends to lose its slight inward curve. If this curve flattens (or becomes too large), then the back and your other joints will experience too much stress, tire more quickly, and can cause pain. CORRECT STANDING POSTURES Proper standing posture should be assumed with all daily activities, even if they only take a few moments, like when brushing your teeth. As in sitting, your ears should fall over your shoulders and your shoulders should fall over your hips. You should keep a slight tension in your abdominal muscles to brace your spine. Your tailbone should point down to the ground, not behind your body, resulting in an over-extended swayback posture.  INCORRECT STANDING POSTURES  Common incorrect standing postures include a forward head, locked knees and/or an excessive swayback. WALKING Walk with an upright posture. Your ears, shoulders and hips should all line-up. PROLONGED  ACTIVITY IN A FLEXED POSITION When completing a task that requires you to bend forward at your waist or lean over a low surface, try to find a way to stabilize 3 out of 4 of your limbs. You can place a hand or elbow on your thigh or rest a knee on the surface you are reaching across. This will provide you more stability, so that  your muscles do not tire as quickly. By keeping your knees relaxed, or slightly bent, you will also reduce stress across your lower back. CORRECT LIFTING TECHNIQUES DO : Assume a wide stance. This will provide you more stability and the opportunity to get as close as possible to the object which you are lifting. Tense your abdominals to brace your spine. Bend at the knees and hips. Keeping your back locked in a neutral-spine position, lift using your leg muscles. Lift with your legs, keeping your back straight. Test the weight of unknown objects before attempting to lift them. Try to keep your elbows locked down at your sides in order get the best strength from your shoulders when carrying an object. Always ask for help when lifting heavy or awkward objects. INCORRECT LIFTING TECHNIQUES DO NOT:  Lock your knees when lifting, even if it is a small object. Bend and twist. Pivot at your feet or move your feet when needing to change directions. Assume that you can safely pick up even a paperclip without proper posture. Document Released: 01/05/2005 Document Revised: 03/30/2011 Document Reviewed: 04/19/2008 [facility] Patient Information 2015 [place], [place]. This information is not intended to replace advice given to you by your health care provider. Make sure you discuss any questions you have with your health care provider.

## 2014-03-05 NOTE — Progress Notes (Signed)
Pre visit review using our clinic review tool, if applicable. No additional management support is needed unless otherwise documented below in the visit note. 

## 2014-03-05 NOTE — Progress Notes (Signed)
   Subjective:    Patient ID: Brandon Frank, male    DOB: [birth year], 57 y.o.   MRN: [id number]  HPI Patient seen with right low back pain radiating down right thigh-about half the way down. Onset about a month ago. Denies any history of injury. He describes a sharp pain 8-9 out of 10 at its worse. He went to chiropractor last week and had some type of stretching treatment without relief. He denies any lower history numbness or weakness. No loss of bladder or bowel control. Symptoms are worse with walking. He used heat and ibuprofen with minimal relief. No prior history of lumbar radiculopathy issues. He does have history history of chronic coccydynia and has taken Cymbalta which has helped with that chronic pain. This pain is much different  Type 2 diabetes. Last A1c 7.1%. He plans to get follow-up labs in one month for repeat A1c along with lipids. Cardiologist recently added Zetia. No symptoms of polyuria or polydipsia. He is currently on metformin. Currently does not have a home glucose monitor but is requesting one  Past Medical History  Diagnosis Date  . Hyperlipidemia   . Chronic low back pain   . Ejection fraction    Past Surgical History  Procedure Laterality Date  . Hernia repair    . Rotator cuff surgery  2012    Dr. [name]  . Prostate biopsy N/A     reports that he quit smoking about 38 years ago. He has never used smokeless tobacco. He reports that he drinks about 1.2 oz of alcohol per week. He reports that he does not use illicit drugs. family history includes Diabetes in his brother and paternal grandfather; Heart disease (age of onset: 24) in his father; Hyperlipidemia in his brother and father; Hypertension in his brother and father. Allergies  Allergen Reactions  . Other     Pain Meds----make him itch but tolerable      Review of Systems  Constitutional: Negative for fatigue.  Eyes: Negative for visual disturbance.  Respiratory: Negative for cough, chest  tightness and shortness of breath.   Cardiovascular: Negative for chest pain, palpitations and leg swelling.  Endocrine: Negative for polydipsia and polyuria.  Musculoskeletal: Positive for back pain.  Skin: Negative for rash.  Neurological: Negative for dizziness, syncope, weakness, light-headedness, numbness and headaches.       Objective:   Physical Exam  Constitutional: He appears well-developed and well-nourished.  Cardiovascular: Normal rate and regular rhythm.   Pulmonary/Chest: Effort normal and breath sounds normal. No respiratory distress. He has no wheezes. He has no rales.  Musculoskeletal: He exhibits no edema.  Straight leg raise are negative bilaterally  Neurological:  Full-strength lower extremity. 2+ reflexes knee and ankle bilaterally. Normal sensory function to touch.          Assessment & Plan:  #1 right lumbar radiculopathy. Nonfocal exam neurologically. Cautious prednisone taper and monitor blood sugars closely. We gave him some simple extension stretches to work on. Touch base in 2 weeks if this is not further improving #2 type 2 diabetes. History of fair control. Home glucose monitor given.  Monitor blood sugars with goals of fasting less than 130 and 2 hour postprandial less than 180.  Future A1c has been ordered.

## 2014-03-07 ENCOUNTER — Other Ambulatory Visit: Payer: Self-pay

## 2014-03-07 MED ORDER — GLUCOSE BLOOD VI STRP: ORAL_STRIP | Status: DC

## 2014-03-07 MED ORDER — ONETOUCH LANCETS MISC: Status: DC

## 2014-04-02 ENCOUNTER — Other Ambulatory Visit: Payer: BLUE CROSS/BLUE SHIELD

## 2014-04-03 ENCOUNTER — Other Ambulatory Visit (INDEPENDENT_AMBULATORY_CARE_PROVIDER_SITE_OTHER): Payer: BLUE CROSS/BLUE SHIELD | Admitting: *Deleted

## 2014-04-03 DIAGNOSIS — E785 Hyperlipidemia, unspecified: Secondary | ICD-10-CM

## 2014-04-03 DIAGNOSIS — E119 Type 2 diabetes mellitus without complications: Secondary | ICD-10-CM

## 2014-04-03 LAB — LIPID PANEL
CHOL/HDL RATIO: 4
Cholesterol: 144 mg/dL (ref 0–200)
HDL: 33.5 mg/dL — ABNORMAL LOW (ref >39.00)
LDL CALC: 90 mg/dL (ref 0–99)
NONHDL: 110.5
Triglycerides: 101 mg/dL (ref 0.0–149.0)
VLDL: 20.2 mg/dL (ref 0.0–40.0)

## 2014-04-03 LAB — HEMOGLOBIN A1C: HEMOGLOBIN A1C: 7.1 % — AB (ref 4.6–6.5)

## 2014-04-06 ENCOUNTER — Telehealth: Payer: Self-pay | Admitting: Family Medicine

## 2014-04-06 MED ORDER — PREDNISONE 10 MG PO TABS: ORAL_TABLET | ORAL | Status: DC

## 2014-04-06 NOTE — Telephone Encounter (Signed)
Refill once 

## 2014-04-06 NOTE — Telephone Encounter (Signed)
Pt request refill of the following: predniSONE (DELTASONE) 10 MG tablet  Pt said now he has right shoulder pain and is asking if he can have a refill. Dr Elease Hashimoto gave him a rx back in Feb    Phamacy: [facility]

## 2014-04-06 NOTE — Telephone Encounter (Signed)
Rx sent to pharmacy   

## 2014-04-30 ENCOUNTER — Ambulatory Visit (INDEPENDENT_AMBULATORY_CARE_PROVIDER_SITE_OTHER): Payer: BLUE CROSS/BLUE SHIELD | Admitting: Family Medicine

## 2014-04-30 ENCOUNTER — Encounter: Payer: Self-pay | Admitting: Family Medicine

## 2014-04-30 VITALS — BP 132/80 | HR 96 | Temp 98.1°F | Wt 222.0 lb

## 2014-04-30 DIAGNOSIS — E119 Type 2 diabetes mellitus without complications: Secondary | ICD-10-CM | POA: Diagnosis not present

## 2014-04-30 DIAGNOSIS — T63443A Toxic effect of venom of bees, assault, initial encounter: Secondary | ICD-10-CM | POA: Diagnosis not present

## 2014-04-30 MED ORDER — PREDNISONE 10 MG PO TABS: ORAL_TABLET | ORAL | Status: DC

## 2014-04-30 NOTE — Progress Notes (Signed)
Pre visit review using our clinic review tool, if applicable. No additional management support is needed unless otherwise documented below in the visit note. 

## 2014-04-30 NOTE — Progress Notes (Signed)
   Subjective:    Patient ID: Brandon Frank, male    DOB: [birth year], 59 y.o.   MRN: [id number]  HPI Patient seen with right upper any swelling. Saturday was in the yard working and was stung by some type of bee on his right hand. He is not sure but thinks this may of been a wasp. He took some Benadryl. He did not apply any ice. He has not had any hives. No tongue or lip swelling. No dyspnea. He had some swelling progressing up the arm. No fevers or chills. No history of generalized allergic reaction or anaphylaxis.  Type 2 diabetes. Recent fasting blood sugars generally between 130 to 150. Currently on metformin 500 mg twice a day  He's had some recurrent cervical radiculopathy symptoms. Previously took prednisone which helped tremendously. Requesting refills.  Past Medical History  Diagnosis Date  . Hyperlipidemia   . Chronic low back pain   . Ejection fraction    Past Surgical History  Procedure Laterality Date  . Hernia repair    . Rotator cuff surgery  2012    Dr. [name]  . Prostate biopsy N/A     reports that he quit smoking about 39 years ago. He has never used smokeless tobacco. He reports that he drinks about 1.2 oz of alcohol per week. He reports that he does not use illicit drugs. family history includes Diabetes in his brother and paternal grandfather; Heart disease (age of onset: 21) in his father; Hyperlipidemia in his brother and father; Hypertension in his brother and father. Allergies  Allergen Reactions  . Other     Pain Meds----make him itch but tolerable      Review of Systems  Constitutional: Negative for fever, chills and fatigue.  Eyes: Negative for visual disturbance.  Respiratory: Negative for cough, chest tightness and shortness of breath.   Cardiovascular: Negative for chest pain, palpitations and leg swelling.  Gastrointestinal: Negative for nausea and vomiting.  Endocrine: Negative for polydipsia and polyuria.  Neurological: Negative for  dizziness, syncope, weakness, light-headedness and headaches.       Objective:   Physical Exam  Constitutional: He appears well-developed and well-nourished.  Cardiovascular: Normal rate and regular rhythm.   Pulmonary/Chest: Effort normal and breath sounds normal. No respiratory distress. He has no wheezes. He has no rales.  Skin:  Right hand reveals some swelling fairly diffusely. He has very minimal erythema but nontender. He has some erythema and swelling spreading proximal to near the elbow region. There some slight warmth but again no tenderness.  No generalized hives          Assessment & Plan:  #1  Bee sting right upper extremity with regional swelling. No evidence for anaphylaxis. Continue Benadryl. Prednisone taper #2 type 2 diabetes with slightly suboptimal control. Titrate metformin 500 mg to 2 twice daily

## 2014-04-30 NOTE — Patient Instructions (Signed)

## 2014-06-19 ENCOUNTER — Ambulatory Visit: Payer: BLUE CROSS/BLUE SHIELD | Admitting: Family Medicine

## 2014-06-25 ENCOUNTER — Ambulatory Visit (INDEPENDENT_AMBULATORY_CARE_PROVIDER_SITE_OTHER): Payer: BLUE CROSS/BLUE SHIELD | Admitting: Family Medicine

## 2014-06-25 ENCOUNTER — Encounter: Payer: Self-pay | Admitting: Family Medicine

## 2014-06-25 VITALS — BP 136/80 | HR 100 | Temp 98.3°F | Wt 221.0 lb

## 2014-06-25 DIAGNOSIS — E1165 Type 2 diabetes mellitus with hyperglycemia: Secondary | ICD-10-CM | POA: Diagnosis not present

## 2014-06-25 DIAGNOSIS — IMO0002 Reserved for concepts with insufficient information to code with codable children: Secondary | ICD-10-CM

## 2014-06-25 MED ORDER — CANAGLIFLOZIN 300 MG PO TABS: 300.0000 mg | ORAL_TABLET | Freq: Every day | ORAL | Status: DC

## 2014-06-25 NOTE — Patient Instructions (Addendum)
Go ahead and increase the Metformin to 500 mg two twice daily Start the Invokana 300 mg once daily. Goal for fasting sugar 70-130 and 2 hour after meals < 180.

## 2014-06-25 NOTE — Progress Notes (Signed)
   Subjective:    Patient ID: Brandon Frank, male    DOB: 1955-07-23, 59 y.o.   MRN: [id number]  HPI   Type 2 diabetes. Recent worsening control. He is supposed to be taking metformin 1000 mg twice daily but apparently is been taking 500 mg twice daily. Last A1c in March 7.1%. In recent weeks has had tremendous increase in blood sugars. Fasting this morning 264. Seven-day average 245 which includes fasting blood sugar and prebedtime blood sugar. He's had some mild thirst and polyuria.Weight change. No fevers or chills. No recent prednisone use.  Past Medical History  Diagnosis Date  . Hyperlipidemia   . Chronic low back pain   . Ejection fraction    Past Surgical History  Procedure Laterality Date  . Hernia repair    . Rotator cuff surgery  2012    Dr. [name]  . Prostate biopsy N/A     reports that he quit smoking about 39 years ago. He has never used smokeless tobacco. He reports that he drinks about 1.2 oz of alcohol per week. He reports that he does not use illicit drugs. family history includes Diabetes in his brother and paternal grandfather; Heart disease (age of onset: 1) in his father; Hyperlipidemia in his brother and father; Hypertension in his brother and father. Allergies  Allergen Reactions  . Other     Pain Meds----make him itch but tolerable      Review of Systems  Constitutional: Negative for fever, chills and appetite change.  Respiratory: Negative for shortness of breath.   Cardiovascular: Negative for chest pain.  Gastrointestinal: Negative for abdominal pain.  Genitourinary: Negative for dysuria.       Objective:   Physical Exam  Constitutional: He appears well-developed and well-nourished.  Cardiovascular: Normal rate and regular rhythm.   Pulmonary/Chest: Effort normal and breath sounds normal. No respiratory distress. He has no wheezes. He has no rales.          Assessment & Plan:  Type 2 diabetes. Poor control by recent readings. Titrate  metformin to 500 mg 2 tablets twice daily and start Invokana 300 mg once daily. Reviewed possible side effects. Prescription written. Return and 4 weeks to reassess with log of blood sugars at that time.

## 2014-06-25 NOTE — Progress Notes (Signed)
Pre visit review using our clinic review tool, if applicable. No additional management support is needed unless otherwise documented below in the visit note. 

## 2014-06-26 ENCOUNTER — Telehealth: Payer: Self-pay | Admitting: Cardiology

## 2014-06-26 NOTE — Telephone Encounter (Signed)
Pt made 6 mos fu appt and needs to know if lab work is needed-pls call

## 2014-06-26 NOTE — Telephone Encounter (Signed)
Will forward to Dr. [name] for advisement on any possible labs he may want drawn at pt's appt on 8/29.

## 2014-06-27 ENCOUNTER — Other Ambulatory Visit: Payer: Self-pay | Admitting: *Deleted

## 2014-06-27 MED ORDER — METFORMIN HCL 500 MG PO TABS: 1000.0000 mg | ORAL_TABLET | Freq: Two times a day (BID) | ORAL | Status: DC

## 2014-06-27 NOTE — Telephone Encounter (Signed)
No labs are needed for my visit

## 2014-06-27 NOTE — Telephone Encounter (Signed)
Left message to call back  

## 2014-07-02 NOTE — Telephone Encounter (Signed)
**Note De-Identified Brandon Frank Obfuscation** Left message on the pts VM stating that he does not need lab work prior to visit with Dr [name].

## 2014-07-16 ENCOUNTER — Other Ambulatory Visit: Payer: Self-pay

## 2014-07-19 ENCOUNTER — Encounter: Payer: Self-pay | Admitting: Internal Medicine

## 2014-07-26 ENCOUNTER — Encounter: Payer: Self-pay | Admitting: Family Medicine

## 2014-07-26 ENCOUNTER — Ambulatory Visit (INDEPENDENT_AMBULATORY_CARE_PROVIDER_SITE_OTHER): Payer: BLUE CROSS/BLUE SHIELD | Admitting: Family Medicine

## 2014-07-26 VITALS — BP 134/88 | HR 90 | Temp 98.5°F | Wt 219.0 lb

## 2014-07-26 DIAGNOSIS — E119 Type 2 diabetes mellitus without complications: Secondary | ICD-10-CM

## 2014-07-26 NOTE — Progress Notes (Signed)
   Subjective:    Patient ID: Brandon Frank, male    DOB: 1955/09/25, 59 y.o.   MRN: [id number]  HPI Patient here for one-month follow-up. Type 2 diabetes with recent poor control. He came in with 7 day average of 245. We increased his metformin and started info, 300 mg once daily. His past 7 day average is down to 140. He's lost 2 pounds. Plans to start more diligent exercise soon. Also has appointment to see nutritionist next week. Overall improved. No polyuria or polydipsia  Past Medical History  Diagnosis Date  . Hyperlipidemia   . Chronic low back pain   . Ejection fraction    Past Surgical History  Procedure Laterality Date  . Hernia repair    . Rotator cuff surgery  2012    Dr. [name]  . Prostate biopsy N/A     reports that he quit smoking about 39 years ago. He has never used smokeless tobacco. He reports that he drinks about 1.2 oz of alcohol per week. He reports that he does not use illicit drugs. family history includes Diabetes in his brother and paternal grandfather; Heart disease (age of onset: 33) in his father; Hyperlipidemia in his brother and father; Hypertension in his brother and father. Allergies  Allergen Reactions  . Other     Pain Meds----make him itch but tolerable      Review of Systems  Constitutional: Negative for appetite change and unexpected weight change.  Endocrine: Negative for polydipsia and polyuria.       Objective:   Physical Exam  Constitutional: He appears well-developed and well-nourished.  Cardiovascular: Normal rate and regular rhythm.   Pulmonary/Chest: Effort normal and breath sounds normal. No respiratory distress. He has no wheezes. He has no rales.  Musculoskeletal: He exhibits no edema.          Assessment & Plan:  Type 2 diabetes. Greatly improved by home readings. He has physical in September and we'll plan repeat A1c then. Continue weight loss efforts. Establish more consistent exercise.

## 2014-07-26 NOTE — Patient Instructions (Signed)
ADA goals for blood sugar are < 130 fasting and < 180 two hours postprandial.

## 2014-07-26 NOTE — Progress Notes (Signed)
Pre visit review using our clinic review tool, if applicable. No additional management support is needed unless otherwise documented below in the visit note. 

## 2014-07-29 ENCOUNTER — Other Ambulatory Visit: Payer: Self-pay | Admitting: Family Medicine

## 2014-08-21 ENCOUNTER — Other Ambulatory Visit: Payer: Self-pay | Admitting: Internal Medicine

## 2014-08-21 NOTE — Telephone Encounter (Signed)
CY Please advise on refill. Pt has pending OV [id number]. Thanks.

## 2014-08-21 NOTE — Telephone Encounter (Signed)
Ok to refill for 6 months 

## 2014-09-17 ENCOUNTER — Ambulatory Visit (INDEPENDENT_AMBULATORY_CARE_PROVIDER_SITE_OTHER): Payer: BLUE CROSS/BLUE SHIELD | Admitting: Cardiology

## 2014-09-17 ENCOUNTER — Encounter: Payer: Self-pay | Admitting: Cardiology

## 2014-09-17 VITALS — BP 130/84 | HR 79 | Ht 70.0 in | Wt 211.0 lb

## 2014-09-17 DIAGNOSIS — R000 Tachycardia, unspecified: Secondary | ICD-10-CM

## 2014-09-17 DIAGNOSIS — E785 Hyperlipidemia, unspecified: Secondary | ICD-10-CM | POA: Diagnosis not present

## 2014-09-17 DIAGNOSIS — R0789 Other chest pain: Secondary | ICD-10-CM

## 2014-09-17 DIAGNOSIS — I471 Supraventricular tachycardia: Secondary | ICD-10-CM | POA: Diagnosis not present

## 2014-09-17 NOTE — Assessment & Plan Note (Signed)
He has not had any recurrent chest discomfort. No further workup is needed.  The patient mentioned to me that he was told that his hemoglobin was higher than normal. I encouraged him to discuss this with his primary physician.

## 2014-09-17 NOTE — Patient Instructions (Addendum)
  Medication Instructions:  Same-no change  Labwork: None  Testing/Procedures: None  Follow-Up: Your physician wants you to follow-up in: 6 months with Dr Angelena Form. You will receive a reminder letter in the mail two months in advance. If you don't receive a letter, please call our office to schedule the follow-up appointment.

## 2014-09-17 NOTE — Progress Notes (Signed)
Cardiology Office Note   Date:  09/17/2014   ID:  Brandon Frank, DOB [birth year], MRN [id number]  PCP:  [name], MD  Cardiologist:  Dola Argyle, MD   Chief Complaint  Patient presents with  . Appointment    Follow-up sinus tachycardia      History of Present Illness: Brandon Frank is a 59 y.o. male who presents today to follow-up history of sinus tachycardia and a strong family history of coronary disease with the patient having multiple risk factors. After his severe sleep apnea was diagnosed and he started using C Pap, his sinus tachycardia improved greatly. He does not need any further workup. We have been trying to be very aggressive with his lipid treatment. I added Zetia to his high-dose atorvastatin the time of his last visit. He does have diabetes that is coming under better control and he is trying to lose some weight. He also tells me that his urologist mentioned that his hemoglobin is higher than normal.    Past Medical History  Diagnosis Date  . Hyperlipidemia   . Chronic low back pain   . Ejection fraction     Past Surgical History  Procedure Laterality Date  . Hernia repair    . Rotator cuff surgery  2012    Dr. [name]  . Prostate biopsy N/A     Patient Active Problem List   Diagnosis Date Noted  . Obstructive sleep apnea 12/06/2013  . Sinus tachycardia 10/27/2013  . Chest pressure 10/25/2013  . Ejection fraction   . Plantar fasciitis of right foot 10/21/2012  . Type 2 diabetes mellitus, controlled 04/14/2012  . Anxiety 02/03/2011  . Overweight(278.02) 10/23/2008  . PERIPHERAL NEUROPATHY 12/06/2006  . Insomnia 12/06/2006  . HYPERGLYCEMIA 12/06/2006  . TRANSAMINASES, SERUM, ELEVATED 11/30/2005  . Hyperlipidemia 11/29/2005      Current Outpatient Prescriptions  Medication Sig Dispense Refill  . ANDROGEL PUMP 20.25 MG/ACT (1.62%) GEL 1 pump to both upper arm area twice daily (total 4 pumps daily) per Dr [name]    . atorvastatin  (LIPITOR) 80 MG tablet Take 1 tablet (80 mg total) by mouth daily. 90 tablet 3  . canagliflozin (INVOKANA) 300 MG TABS tablet Take 300 mg by mouth daily before breakfast. 30 tablet 11  . CIALIS 5 MG tablet Take 5 mg by mouth daily as needed for erectile dysfunction.     . DULoxetine (CYMBALTA) 60 MG capsule Take 150 mg by mouth daily. Per Dr Candis Schatz    . ezetimibe (ZETIA) 10 MG tablet Take 1 tablet (10 mg total) by mouth daily. 30 tablet 6  . fluticasone (FLONASE) 50 MCG/ACT nasal spray Place 1 spray into both nostrils daily as needed for allergies or rhinitis.    [name] glucosamine-chondroitin 500-400 MG tablet Take 1 tablet by mouth daily.     . hydrOXYzine (ATARAX) 10 MG tablet Take 10 mg by mouth daily.     [name] LORazepam (ATIVAN) 0.5 MG tablet Take 1 tablet (0.5 mg total) by mouth as needed for anxiety. 30 tablet 5  . metFORMIN (GLUCOPHAGE) 500 MG tablet Take 2 tablets (1,000 mg total) by mouth 2 (two) times daily with a meal. Take 2 tablets by mouth twice daily 180 tablet 1  . Multiple Vitamin (MULTIVITAMIN) tablet Take 1 tablet by mouth daily.      . ONE TOUCH LANCETS MISC Verio lancet. Check one time daily. E11.9 100 each 1  . ONETOUCH VERIO test strip CHECK ONE TIME DAILY 100 each 2  .  OVER THE COUNTER MEDICATION Take 1,600 mg by mouth daily. ACETYL--L-CARNITINE    . OVER THE COUNTER MEDICATION Take 600 mg by mouth daily. ALPHA LIPOIC ACID    . OVER THE COUNTER MEDICATION Take 500 mg by mouth daily. BENFOTIAMINE    . Psyllium (METAMUCIL) 30.9 % POWD Take 1 packet by mouth daily.      . zaleplon (SONATA) 5 MG capsule TAKE ONE CAPSULE BY MOUTH AT BEDTIME AS NEEDED FOR SLEEP 30 capsule 5  . zolpidem (AMBIEN) 10 MG tablet TAKE ONE TABLET BY MOUTH AT BEDTIME AS NEEDED FOR SLEEP 30 tablet 2  . [DISCONTINUED] paroxetine mesylate (PEXEVA) 20 MG tablet Take 1 tablet (20 mg total) by mouth every morning. 60 tablet 3   No current facility-administered medications for this visit.    Allergies:    Review of patient's allergies indicates no known allergies.    Social History:  The patient  reports that he quit smoking about 39 years ago. He has never used smokeless tobacco. He reports that he drinks about 1.2 oz of alcohol per week. He reports that he does not use illicit drugs.   Family History:  The patient's family history includes Diabetes in his brother and paternal grandfather; Heart disease (age of onset: 64) in his father; Hyperlipidemia in his brother and father; Hypertension in his brother and father.    ROS:  Please see the history of present illness.    Patient denies fever, chills, headache, sweats, rash, change in vision, change in hearing, chest pain, cough, nausea or vomiting, urinary symptoms. All other systems are reviewed and are negative.     PHYSICAL EXAM: VS:  BP 130/84 mmHg  Pulse 79  Ht 5\' 10"  (1.778 m)  Wt 211 lb (95.709 kg)  BMI 30.28 kg/m2 , Patient is oriented to person time and place. Affect is normal. Head is atraumatic. Sclera and conjunctiva are normal. There is no jugular venous distention. Lungs are clear. Respiratory effort is not labored. Cardiac exam reveals S1 and S2. Abdomen is soft. There is no peripheral edema. There are no musculoskeletal deformities. There are no skin rashes. Neurologic is grossly intact.  EKG:   EKG is done today and reviewed by me. There is normal sinus rhythm. The rate is 79 at rest.   Recent Labs: 09/22/2013: BUN 14; Creatinine, Ser 1.0; Hemoglobin 17.3*; Platelets 184.0; Potassium 4.5; Sodium 138; TSH 1.34 01/17/2014: ALT 33    Lipid Panel    Component Value Date/Time   CHOL 144 04/03/2014 0912   TRIG 101.0 04/03/2014 0912   TRIG 88 11/19/2005 1012   HDL 33.50* 04/03/2014 0912   CHOLHDL 4 04/03/2014 0912   CHOLHDL 4.5 CALC 11/19/2005 1012   VLDL 20.2 04/03/2014 0912   LDLCALC 90 04/03/2014 0912   LDLDIRECT 168.2 10/29/2008 0813      Wt Readings from Last 3 Encounters:  09/17/14 211 lb (95.709 kg)    07/26/14 219 lb (99.338 kg)  06/25/14 221 lb (100.245 kg)      Current medicines are reviewed       ASSESSMENT AND PLAN:

## 2014-09-17 NOTE — Assessment & Plan Note (Signed)
Sinus tachycardia is greatly improved after his sleep apnea is being treated. No further workup.

## 2014-09-17 NOTE — Assessment & Plan Note (Signed)
On high-dose Lipitor and Zetia, his LDL is down to 90. These meds are all being used for primary prevention in his case. The cost of his meds is quite high. I told him that if finances became an issue, that Zetia could be stopped.

## 2014-09-20 ENCOUNTER — Other Ambulatory Visit: Payer: Self-pay | Admitting: Cardiology

## 2014-09-25 ENCOUNTER — Other Ambulatory Visit (INDEPENDENT_AMBULATORY_CARE_PROVIDER_SITE_OTHER): Payer: BLUE CROSS/BLUE SHIELD

## 2014-09-25 DIAGNOSIS — Z0000 Encounter for general adult medical examination without abnormal findings: Secondary | ICD-10-CM

## 2014-09-25 LAB — CBC WITH DIFFERENTIAL/PLATELET
BASOS PCT: 0.5 % (ref 0.0–3.0)
Basophils Absolute: 0 10*3/uL (ref 0.0–0.1)
EOS PCT: 2.8 % (ref 0.0–5.0)
Eosinophils Absolute: 0.2 10*3/uL (ref 0.0–0.7)
HCT: 50.9 % (ref 39.0–52.0)
HEMOGLOBIN: 17.2 g/dL — AB (ref 13.0–17.0)
Lymphocytes Relative: 23 % (ref 12.0–46.0)
Lymphs Abs: 1.3 10*3/uL (ref 0.7–4.0)
MCHC: 33.8 g/dL (ref 30.0–36.0)
MCV: 94.4 fl (ref 78.0–100.0)
MONOS PCT: 12.5 % — AB (ref 3.0–12.0)
Monocytes Absolute: 0.7 10*3/uL (ref 0.1–1.0)
Neutro Abs: 3.6 10*3/uL (ref 1.4–7.7)
Neutrophils Relative %: 61.2 % (ref 43.0–77.0)
Platelets: 163 10*3/uL (ref 150.0–400.0)
RBC: 5.39 Mil/uL (ref 4.22–5.81)
RDW: 13.6 % (ref 11.5–15.5)
WBC: 5.8 10*3/uL (ref 4.0–10.5)

## 2014-09-25 LAB — BASIC METABOLIC PANEL
BUN: 16 mg/dL (ref 6–23)
CHLORIDE: 106 meq/L (ref 96–112)
CO2: 27 mEq/L (ref 19–32)
Calcium: 9.5 mg/dL (ref 8.4–10.5)
Creatinine, Ser: 0.99 mg/dL (ref 0.40–1.50)
GFR: 82.68 mL/min (ref >60.00)
GLUCOSE: 125 mg/dL — AB (ref 70–99)
POTASSIUM: 5 meq/L (ref 3.5–5.1)
SODIUM: 142 meq/L (ref 135–145)

## 2014-09-25 LAB — HEMOGLOBIN A1C: Hgb A1c MFr Bld: 6.7 % — ABNORMAL HIGH (ref 4.6–6.5)

## 2014-09-25 LAB — LIPID PANEL
CHOL/HDL RATIO: 5
Cholesterol: 161 mg/dL (ref 0–200)
HDL: 32.2 mg/dL — AB (ref >39.00)
LDL CALC: 101 mg/dL — AB (ref 0–99)
NONHDL: 129.15
Triglycerides: 143 mg/dL (ref 0.0–149.0)
VLDL: 28.6 mg/dL (ref 0.0–40.0)

## 2014-09-25 LAB — HEPATIC FUNCTION PANEL
ALBUMIN: 4.6 g/dL (ref 3.5–5.2)
ALT: 31 U/L (ref 0–53)
AST: 24 U/L (ref 0–37)
Alkaline Phosphatase: 58 U/L (ref 39–117)
Bilirubin, Direct: 0.1 mg/dL (ref 0.0–0.3)
TOTAL PROTEIN: 6.7 g/dL (ref 6.0–8.3)
Total Bilirubin: 0.7 mg/dL (ref 0.2–1.2)

## 2014-09-25 LAB — MICROALBUMIN / CREATININE URINE RATIO
Creatinine,U: 125.4 mg/dL
Microalb Creat Ratio: 1.2 mg/g (ref 0.0–30.0)
Microalb, Ur: 1.5 mg/dL (ref 0.0–1.9)

## 2014-09-25 LAB — PSA: PSA: 3.84 ng/mL (ref 0.10–4.00)

## 2014-09-25 LAB — TSH: TSH: 1.02 u[IU]/mL (ref 0.35–4.50)

## 2014-09-26 LAB — INSULIN, FASTING: INSULIN FASTING, SERUM: 16.3 u[IU]/mL (ref 2.0–19.6)

## 2014-09-26 LAB — C-PEPTIDE: C PEPTIDE: 3.35 ng/mL (ref 0.80–3.90)

## 2014-10-02 ENCOUNTER — Ambulatory Visit (INDEPENDENT_AMBULATORY_CARE_PROVIDER_SITE_OTHER): Payer: BLUE CROSS/BLUE SHIELD | Admitting: Family Medicine

## 2014-10-02 ENCOUNTER — Other Ambulatory Visit: Payer: Self-pay

## 2014-10-02 ENCOUNTER — Encounter: Payer: Self-pay | Admitting: Family Medicine

## 2014-10-02 VITALS — BP 130/80 | HR 90 | Temp 98.0°F | Ht 70.0 in | Wt 210.0 lb

## 2014-10-02 DIAGNOSIS — Z0000 Encounter for general adult medical examination without abnormal findings: Secondary | ICD-10-CM

## 2014-10-02 DIAGNOSIS — Z23 Encounter for immunization: Secondary | ICD-10-CM | POA: Diagnosis not present

## 2014-10-02 MED ORDER — BAYER MICROLET LANCETS MISC: Status: DC

## 2014-10-02 MED ORDER — GLUCOSE BLOOD VI STRP: ORAL_STRIP | Status: DC

## 2014-10-02 NOTE — Progress Notes (Signed)
Subjective:    Patient ID: Brandon Frank, male    DOB: 1955-10-14, 59 y.o.   MRN: [id number]  HPI Patient seen for complete physical.   Chronic problems include history of dyslipidemia, obstructive sleep apnea, type 2 diabetes. He is recently met with diabetes educator and is gotten some good information. He has not had an eye exam in over a year. His numbers with regard to A1c and blood sugars are slowly improving. He has lost some weight. He is trying to exercise more diligently. Patient needs flu vaccine. Also no history of pneumonia vaccine. Colonoscopy up-to-date.  Past Medical History  Diagnosis Date  . Hyperlipidemia   . Chronic low back pain   . Ejection fraction    Past Surgical History  Procedure Laterality Date  . Hernia repair    . Rotator cuff surgery  2012    Dr. [name]  . Prostate biopsy N/A     reports that he quit smoking about 39 years ago. He has never used smokeless tobacco. He reports that he drinks about 1.2 oz of alcohol per week. He reports that he does not use illicit drugs. family history includes Diabetes in his brother and paternal grandfather; Heart disease (age of onset: 33) in his father; Hyperlipidemia in his brother and father; Hypertension in his brother and father. No Known Allergies    Review of Systems  Constitutional: Negative for fever, activity change, appetite change, fatigue and unexpected weight change.  HENT: Negative for congestion, ear pain and trouble swallowing.   Eyes: Negative for pain and visual disturbance.  Respiratory: Negative for cough, chest tightness, shortness of breath and wheezing.   Cardiovascular: Negative for chest pain, palpitations and leg swelling.  Gastrointestinal: Negative for nausea, vomiting, abdominal pain, diarrhea, constipation, blood in stool, abdominal distention and rectal pain.  Endocrine: Negative for polydipsia and polyuria.  Genitourinary: Negative for dysuria, hematuria and testicular pain.    Musculoskeletal: Negative for joint swelling and arthralgias.  Skin: Negative for rash.  Neurological: Negative for dizziness, syncope, weakness, light-headedness and headaches.  Hematological: Negative for adenopathy.  Psychiatric/Behavioral: Negative for confusion and dysphoric mood.       Objective:   Physical Exam  Constitutional: He is oriented to person, place, and time. He appears well-developed and well-nourished. No distress.  HENT:  Head: Normocephalic and atraumatic.  Right Ear: External ear normal.  Left Ear: External ear normal.  Mouth/Throat: Oropharynx is clear and moist.  Eyes: Conjunctivae and EOM are normal. Pupils are equal, round, and reactive to light.  Neck: Normal range of motion. Neck supple. No thyromegaly present.  Cardiovascular: Normal rate, regular rhythm and normal heart sounds.   No murmur heard. Pulmonary/Chest: No respiratory distress. He has no wheezes. He has no rales.  Abdominal: Soft. Bowel sounds are normal. He exhibits no distension and no mass. There is no tenderness. There is no rebound and no guarding.  Musculoskeletal: He exhibits no edema.  Lymphadenopathy:    He has no cervical adenopathy.  Neurological: He is alert and oriented to person, place, and time. He displays normal reflexes. No cranial nerve deficit.  Skin: No rash noted.  Psychiatric: He has a normal mood and affect.          Assessment & Plan:  Complete physical. Labs reviewed. No major concerns. He had requested fasting insulin and C-peptide levels and these were both normal. He definitely has pattern of insulin resistance. Set up eye exam. Flu vaccine given. We've recommended Prevnar 13 and  patient consents. Repeat A1c in 6 months.

## 2014-10-02 NOTE — Addendum Note (Signed)
Addended by: Marcina Millard on: 10/02/2014 09:52 AM   Modules accepted: Orders

## 2014-10-02 NOTE — Patient Instructions (Signed)
Try to schedule eye exam at some point this year.

## 2014-10-02 NOTE — Progress Notes (Signed)
Pre visit review using our clinic review tool, if applicable. No additional management support is needed unless otherwise documented below in the visit note. 

## 2014-11-11 ENCOUNTER — Other Ambulatory Visit: Payer: Self-pay | Admitting: Family Medicine

## 2014-11-14 ENCOUNTER — Other Ambulatory Visit: Payer: Self-pay | Admitting: Family Medicine

## 2014-11-14 MED ORDER — METFORMIN HCL 500 MG PO TABS: 1000.0000 mg | ORAL_TABLET | Freq: Two times a day (BID) | ORAL | Status: DC

## 2014-11-21 LAB — HM DIABETES EYE EXAM

## 2014-11-22 ENCOUNTER — Encounter: Payer: Self-pay | Admitting: Family Medicine

## 2014-11-23 ENCOUNTER — Ambulatory Visit (INDEPENDENT_AMBULATORY_CARE_PROVIDER_SITE_OTHER): Payer: BLUE CROSS/BLUE SHIELD | Admitting: Family Medicine

## 2014-11-23 ENCOUNTER — Encounter: Payer: Self-pay | Admitting: Family Medicine

## 2014-11-23 VITALS — BP 144/74 | Temp 98.5°F | Wt 215.0 lb

## 2014-11-23 DIAGNOSIS — H1013 Acute atopic conjunctivitis, bilateral: Secondary | ICD-10-CM | POA: Diagnosis not present

## 2014-11-23 MED ORDER — OLOPATADINE HCL 0.2 % OP SOLN
OPHTHALMIC | Status: DC
Start: 2014-11-23 — End: 2015-03-20

## 2014-11-23 NOTE — Patient Instructions (Addendum)
Seems like allergic conjunctivitis- possibly from stone sealant  Trial pataday if eye doctor ok with it  You are going to see your eye doctor today given concern of pain in eye  If you have pain or blurry vision over the weekend- your eye doctor should have someone on call generally- would call them immediately

## 2014-11-23 NOTE — Progress Notes (Signed)
Garret Reddish, MD  Subjective:  Brandon Frank is a 59 y.o. year old very pleasant male patient who presents for/with See problem oriented charting ROS- no fever, chills, nausea, vomiting. No blurry or double vision.   Past Medical History- OSA, DM controlled, insomnia, hyperlipidemia  Medications- reviewed and updated Current Outpatient Prescriptions  Medication Sig Dispense Refill  . ANDROGEL PUMP 20.25 MG/ACT (1.62%) GEL 1 pump to both upper arm area twice daily (total 4 pumps daily) per Dr [name]    . atorvastatin (LIPITOR) 80 MG tablet Take 1 tablet (80 mg total) by mouth daily. 90 tablet 3  . BAYER MICROLET LANCETS lancets Check 2 times daily E11.9 200 each 2  . canagliflozin (INVOKANA) 300 MG TABS tablet Take 300 mg by mouth daily before breakfast. 30 tablet 11  . CIALIS 5 MG tablet Take 5 mg by mouth daily as needed for erectile dysfunction.     . DULoxetine (CYMBALTA) 60 MG capsule Take 150 mg by mouth daily. Per Dr Candis Schatz    . fluticasone [facility]) 50 MCG/ACT nasal spray Place 1 spray into both nostrils daily as needed for allergies or rhinitis.    [name] glucosamine-chondroitin 500-400 MG tablet Take 1 tablet by mouth daily.     [name] glucose blood (BAYER CONTOUR TEST) test strip Check 2 times daily E11.9 200 each 2  . hydrOXYzine (ATARAX) 10 MG tablet Take 10 mg by mouth daily.     . metFORMIN (GLUCOPHAGE) 500 MG tablet Take 2 tablets (1,000 mg total) by mouth 2 (two) times daily with a meal. Take 2 tablets by mouth twice daily 180 tablet 1  . Multiple Vitamin (MULTIVITAMIN) tablet Take 1 tablet by mouth daily.      [name] OVER THE COUNTER MEDICATION Take 1,600 mg by mouth daily. ACETYL--L-CARNITINE    . OVER THE COUNTER MEDICATION Take 600 mg by mouth daily. ALPHA LIPOIC ACID    . OVER THE COUNTER MEDICATION Take 500 mg by mouth daily. BENFOTIAMINE    . Psyllium (METAMUCIL) 30.9 % POWD Take 1 packet by mouth daily.      [name] ZETIA 10 MG tablet TAKE ONE TABLET BY MOUTH ONCE DAILY 30 tablet  3  . LORazepam (ATIVAN) 0.5 MG tablet Take 1 tablet (0.5 mg total) by mouth as needed for anxiety. (Patient not taking: Reported on 11/23/2014) 30 tablet 5  . zaleplon (SONATA) 5 MG capsule TAKE ONE CAPSULE BY MOUTH AT BEDTIME AS NEEDED FOR SLEEP (Patient not taking: Reported on 11/23/2014) 30 capsule 5  . zolpidem (AMBIEN) 10 MG tablet TAKE ONE TABLET BY MOUTH AT BEDTIME AS NEEDED FOR SLEEP (Patient not taking: Reported on 11/23/2014) 30 tablet 2  . [DISCONTINUED] paroxetine mesylate (PEXEVA) 20 MG tablet Take 1 tablet (20 mg total) by mouth every morning. 60 tablet 3   No current facility-administered medications for this visit.    Objective: BP 144/74 mmHg  Temp(Src) 98.5 F (36.9 C)  Wt 215 lb (97.523 kg) Gen: NAD, resting comfortably CV: RRR no murmurs rubs or gallops Lungs: CTAB no crackles, wheeze, rhonchi Abdomen: soft/nontender/nondistended/normal bowel sounds. No rebound or guarding.  Ext: no edema Skin: warm, dry Neuro: CN II-XII intact, sensation and reflexes normal throughout, 5/5 muscle strength in bilateral upper and lower extremities. Normal finger to nose. Normal rapid alternating movements. Normal romberg. No pronator drift.   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 Felt fine on Wednesday. 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 Thursday night started with red, itchy, watery eyes. This morning, has felt like his eyes still had dilating drops in them. He is sensitive to light. Has a headache that hs nto responded to excedrin within 2 hours. Used visine for redness and seemed to clear it up but watering persisted. First describes mild pain but later says it is more of a tiredness as well as mild pruritic feeling- rubs his eyes with the  watering. Feels like sand blowing into eye but no discrete area of pain. 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 Could be viral but once again erythema minimal. strongly doubt bacterial with only watery discharge. Advised patient to always use gloves when using this material. His first expression was of pain and with this element advised optho follow up- he tried to go to doctor's office today but they were closed. He plans to go Monday or call over weekend if true pain (not just itching, irritation, tiredness). Visual acuity normal reassuring. No sensation of foreign object so no fluorescein eye exam done. We will trial pataday drops daily. Avoid visine.  advised avoid rubbing eyes. Doubt something like uveitis or glaucoma- but close optho follow up if truly has pain or if starts having vision issues. In regards to headaches- has a normal neurological exam and doubt intracranial pathology.   Patient anxious about eye issues, will trend BP BP Readings from Last 3 Encounters:  11/23/14 144/74  10/02/14 130/80  09/17/14 130/84    Meds ordered this encounter  Medications  . Olopatadine HCl 0.2 % SOLN    Sig: Instill 1 drop into each affected eye once daily for 1 week    Dispense:  2.5 mL    Refill:  0

## 2014-12-17 ENCOUNTER — Other Ambulatory Visit: Payer: Self-pay | Admitting: Cardiology

## 2015-01-02 ENCOUNTER — Other Ambulatory Visit (INDEPENDENT_AMBULATORY_CARE_PROVIDER_SITE_OTHER): Payer: BLUE CROSS/BLUE SHIELD

## 2015-01-02 ENCOUNTER — Encounter: Payer: Self-pay | Admitting: Family Medicine

## 2015-01-02 DIAGNOSIS — E119 Type 2 diabetes mellitus without complications: Secondary | ICD-10-CM | POA: Diagnosis not present

## 2015-01-02 LAB — HEMOGLOBIN A1C: Hgb A1c MFr Bld: 6.2 % (ref 4.6–6.5)

## 2015-01-16 ENCOUNTER — Other Ambulatory Visit: Payer: Self-pay | Admitting: Internal Medicine

## 2015-01-16 ENCOUNTER — Other Ambulatory Visit: Payer: Self-pay | Admitting: Cardiology

## 2015-01-16 ENCOUNTER — Other Ambulatory Visit: Payer: Self-pay | Admitting: Family Medicine

## 2015-01-16 NOTE — Telephone Encounter (Signed)
Ok to refill 6 months 

## 2015-01-16 NOTE — Telephone Encounter (Signed)
Sonata 5 mg last refilled 08/21/14 #30 x  5 refills 1 po QHS PRN   Please advise Dr. Annamaria Boots thanks

## 2015-01-16 NOTE — Telephone Encounter (Signed)
Rx has been called in per CY. 

## 2015-01-29 ENCOUNTER — Telehealth: Payer: Self-pay | Admitting: Family Medicine

## 2015-01-29 ENCOUNTER — Ambulatory Visit (INDEPENDENT_AMBULATORY_CARE_PROVIDER_SITE_OTHER): Payer: BLUE CROSS/BLUE SHIELD | Admitting: Adult Health

## 2015-01-29 VITALS — BP 118/84 | HR 92 | Temp 98.4°F | Ht 70.0 in | Wt 217.0 lb

## 2015-01-29 DIAGNOSIS — R1032 Left lower quadrant pain: Secondary | ICD-10-CM | POA: Diagnosis not present

## 2015-01-29 MED ORDER — CIPROFLOXACIN HCL 500 MG PO TABS: 500.0000 mg | ORAL_TABLET | Freq: Two times a day (BID) | ORAL | Status: DC

## 2015-01-29 MED ORDER — METRONIDAZOLE 500 MG PO TABS: 500.0000 mg | ORAL_TABLET | Freq: Three times a day (TID) | ORAL | Status: DC

## 2015-01-29 NOTE — Patient Instructions (Addendum)
It was great meeting you today!  I have sent in a prescription for Flagyl and Cipro. Take the Flagyl 3 times a day for 10 days and the Cipro twice a day for 10 days.   Do not drink alcohol while taking these medications.   Follow up if no improvement in the next 2-3 days.   Diverticulitis Diverticulitis is inflammation or infection of small pouches in your colon that form when you have a condition called diverticulosis. The pouches in your colon are called diverticula. Your colon, or large intestine, is where water is absorbed and stool is formed. Complications of diverticulitis can include:  Bleeding.  Severe infection.  Severe pain.  Perforation of your colon.  Obstruction of your colon. CAUSES  Diverticulitis is caused by bacteria. Diverticulitis happens when stool becomes trapped in diverticula. This allows bacteria to grow in the diverticula, which can lead to inflammation and infection. RISK FACTORS People with diverticulosis are at risk for diverticulitis. Eating a diet that does not include enough fiber from fruits and vegetables may make diverticulitis more likely to develop. SYMPTOMS  Symptoms of diverticulitis may include:  Abdominal pain and tenderness. The pain is normally located on the left side of the abdomen, but may occur in other areas.  Fever and chills.  Bloating.  Cramping.  Nausea.  Vomiting.  Constipation.  Diarrhea.  Blood in your stool. DIAGNOSIS  Your health care provider will ask you about your medical history and do a physical exam. You may need to have tests done because many medical conditions can cause the same symptoms as diverticulitis. Tests may include:  Blood tests.  Urine tests.  Imaging tests of the abdomen, including X-rays and CT scans. When your condition is under control, your health care provider may recommend that you have a colonoscopy. A colonoscopy can show how severe your diverticula are and whether something else  is causing your symptoms. TREATMENT  Most cases of diverticulitis are mild and can be treated at home. Treatment may include:  Taking over-the-counter pain medicines.  Following a clear liquid diet.  Taking antibiotic medicines by mouth for 7-10 days. More severe cases may be treated at a hospital. Treatment may include:  Not eating or drinking.  Taking prescription pain medicine.  Receiving antibiotic medicines through an IV tube.  Receiving fluids and nutrition through an IV tube.  Surgery. HOME CARE INSTRUCTIONS   Follow your health care provider's instructions carefully.  Follow a full liquid diet or other diet as directed by your health care provider. After your symptoms improve, your health care provider may tell you to change your diet. He or she may recommend you eat a high-fiber diet. Fruits and vegetables are good sources of fiber. Fiber makes it easier to pass stool.  Take fiber supplements or probiotics as directed by your health care provider.  Only take medicines as directed by your health care provider.  Keep all your follow-up appointments. SEEK MEDICAL CARE IF:   Your pain does not improve.  You have a hard time eating food.  Your bowel movements do not return to normal. SEEK IMMEDIATE MEDICAL CARE IF:   Your pain becomes worse.  Your symptoms do not get better.  Your symptoms suddenly get worse.  You have a fever.  You have repeated vomiting.  You have bloody or black, tarry stools. MAKE SURE YOU:   Understand these instructions.  Will watch your condition.  Will get help right away if you are not doing well or  get worse.   This information is not intended to replace advice given to you by your health care provider. Make sure you discuss any questions you have with your health care provider.   Document Released: 10/15/2004 Document Revised: 01/10/2013 Document Reviewed: 11/30/2012 Elsevier Interactive Patient Education [employer].

## 2015-01-29 NOTE — Progress Notes (Signed)
Pre visit review using our clinic review tool, if applicable. No additional management support is needed unless otherwise documented below in the visit note. 

## 2015-01-29 NOTE — Telephone Encounter (Signed)
Needs an appt. Has not been seen for this since 2015.

## 2015-01-29 NOTE — Telephone Encounter (Signed)
lmom for pt to call back

## 2015-01-29 NOTE — Telephone Encounter (Signed)
Pt is having a flare up diverticulitis and would like the same abx md gave him the last time. [facility] [address]

## 2015-01-29 NOTE — Progress Notes (Signed)
Subjective:    Patient ID: Brandon Frank, male    DOB: 27-Oct-1955, 60 y.o.   MRN: [id number]  HPI  60 year old male, patient of Dr. Elease Hashimoto, who I am seeing today for an acute issue. He  Has a history of  acute diverticulitis, the last flare was in August of 2015 at which time he was treated with a 10 day course of Cipro and Flagyl - he endorses that this has worked the best. He is being seen with a 2 week history of relatively low-grade mostly constant left lower quadrant abdominal pain. Achy quality. No radiation. No urinary symptoms. No nausea ,vomiting, or fevers. He is having periods of diarrhea. Denies any blood in stool.  Per patient " I have been going through this for 20 years, this is definitely a flare up.  Previous colonoscopies have shown diverticulosis changes.   Review of Systems  Constitutional: Negative.   Gastrointestinal: Positive for abdominal pain (LLQ) and diarrhea. Negative for nausea, vomiting, constipation, blood in stool, abdominal distention, anal bleeding and rectal pain.  Genitourinary: Negative.   All other systems reviewed and are negative.  Past Medical History  Diagnosis Date  . Hyperlipidemia   . Chronic low back pain   . Ejection fraction     Social History   Social History  . Marital Status: Married    Spouse Name: N/A  . Number of Children: N/A  . Years of Education: N/A   Occupational History  . Not on file.   Social History Main Topics  . Smoking status: Former Smoker    Quit date: 03/20/1975  . Smokeless tobacco: Never Used  . Alcohol Use: 1.2 oz/week    2 Cans of beer per week     Comment: daily  . Drug Use: No  . Sexual Activity: Not on file   Other Topics Concern  . Not on file   Social History Narrative    Past Surgical History  Procedure Laterality Date  . Hernia repair    . Rotator cuff surgery  2012    Dr. [name]  . Prostate biopsy N/A     Family History  Problem Relation Age of Onset  . Heart disease  Father 77    MI  . Hyperlipidemia Father   . Hypertension Father   . Diabetes Brother   . Hypertension Brother   . Hyperlipidemia Brother   . Diabetes Paternal Grandfather     No Known Allergies  Current Outpatient Prescriptions on File Prior to Visit  Medication Sig Dispense Refill  . ANDROGEL PUMP 20.25 MG/ACT (1.62%) GEL 1 pump to both upper arm area twice daily (total 4 pumps daily) per Dr [name]    . atorvastatin (LIPITOR) 80 MG tablet Take 1 tablet (80 mg total) by mouth daily. 90 tablet 1  . BAYER MICROLET LANCETS lancets Check 2 times daily E11.9 200 each 2  . canagliflozin (INVOKANA) 300 MG TABS tablet Take 300 mg by mouth daily before breakfast. 30 tablet 11  . CIALIS 5 MG tablet Take 5 mg by mouth daily as needed for erectile dysfunction.     . DULoxetine (CYMBALTA) 60 MG capsule Take 150 mg by mouth daily. Per Dr Candis Schatz    . ezetimibe (ZETIA) 10 MG tablet TAKE ONE TABLET BY MOUTH ONCE DAILY 30 tablet 2  . fluticasone (FLONASE) 50 MCG/ACT nasal spray Place 1 spray into both nostrils daily as needed for allergies or rhinitis.    [name] glucosamine-chondroitin 500-400 MG  tablet Take 1 tablet by mouth daily.     [name] glucose blood (BAYER CONTOUR TEST) test strip Check 2 times daily E11.9 200 each 2  . hydrOXYzine (ATARAX) 10 MG tablet Take 10 mg by mouth daily.     [name] LORazepam (ATIVAN) 0.5 MG tablet Take 1 tablet (0.5 mg total) by mouth as needed for anxiety. 30 tablet 5  . metFORMIN (GLUCOPHAGE) 500 MG tablet TAKE TWO TABLETS BY MOUTH TWICE DAILY WITH A MEAL 180 tablet 0  . Multiple Vitamin (MULTIVITAMIN) tablet Take 1 tablet by mouth daily.      . Olopatadine HCl 0.2 % SOLN Instill 1 drop into each affected eye once daily for 1 week 2.5 mL 0  . OVER THE COUNTER MEDICATION Take 1,600 mg by mouth daily. ACETYL--L-CARNITINE    . OVER THE COUNTER MEDICATION Take 600 mg by mouth daily. ALPHA LIPOIC ACID    . OVER THE COUNTER MEDICATION Take 500 mg by mouth daily. BENFOTIAMINE    .  Psyllium (METAMUCIL) 30.9 % POWD Take 1 packet by mouth daily.      . zaleplon (SONATA) 5 MG capsule TAKE ONE CAPSULE BY MOUTH AT BEDTIME AS NEEDED FOR SLEEP 30 capsule 5  . zolpidem (AMBIEN) 10 MG tablet TAKE ONE TABLET BY MOUTH AT BEDTIME AS NEEDED FOR SLEEP 30 tablet 2  . [DISCONTINUED] paroxetine mesylate (PEXEVA) 20 MG tablet Take 1 tablet (20 mg total) by mouth every morning. 60 tablet 3   No current facility-administered medications on file prior to visit.    BP 118/84 mmHg  Pulse 92  Temp(Src) 98.4 F (36.9 C) (Oral)  Ht 5\' 10"  (1.778 m)  Wt 217 lb (98.431 kg)  BMI 31.14 kg/m2  SpO2 96%       Objective:   Physical Exam Constitutional: He appears well-developed and well-nourished.  Cardiovascular: Normal rate and regular rhythm.  Pulmonary/Chest: Effort normal and breath sounds normal. No respiratory distress. He has no wheezes. He has no rales.  Abdominal: Soft. Bowel sounds are normal. He exhibits no distension and no mass. There is tenderness. There is no rebound and no guarding.  Patient has slight tenderness in the left lower quadrant with deep palpation. No guarding or rebound.      Assessment & Plan:  1. Abdominal pain, left lower quadrant - Likely diverticulitis flare due to patients history.  - ciprofloxacin (CIPRO) 500 MG tablet; Take 1 tablet (500 mg total) by mouth 2 (two) times daily.  Dispense: 20 tablet; Refill: 0 - metroNIDAZOLE (FLAGYL) 500 MG tablet; Take 1 tablet (500 mg total) by mouth 3 (three) times daily.  Dispense: 30 tablet; Refill: 0 - Abstain from alcohol during course of flagyl and for three days after finishing - Follow up if no improvement in the next 2-3 days.

## 2015-01-31 NOTE — Telephone Encounter (Signed)
Appointment on 1/10 with [facility]

## 2015-02-27 ENCOUNTER — Ambulatory Visit: Payer: BLUE CROSS/BLUE SHIELD | Admitting: Internal Medicine

## 2015-03-13 ENCOUNTER — Other Ambulatory Visit: Payer: Self-pay | Admitting: Family Medicine

## 2015-03-13 MED ORDER — GLUCOSE BLOOD VI STRP: ORAL_STRIP | Status: DC

## 2015-03-13 MED ORDER — BAYER MICROLET LANCETS MISC: Status: DC

## 2015-03-18 ENCOUNTER — Other Ambulatory Visit: Payer: Self-pay

## 2015-03-18 MED ORDER — GLUCOSE BLOOD VI STRP: ORAL_STRIP | Status: DC

## 2015-03-19 NOTE — Progress Notes (Signed)
Cardiology Office Note   Date:  03/20/2015   ID:  Brandon Frank, DOB 1955/02/14, MRN [id number]  PCP:  [name], MD    Chief Complaint  Patient presents with  . Hyperlipidemia  . Tachycardia      History of Present Illness: Brandon Frank is a 60 y.o. male who presents today to follow-up.  He has a history of sinus tachycardia and a strong family history of coronary disease with the patient having multiple risk factors. He has a history of severe sleep apnea was diagnosed and he started using and has been on CPAP.  Since starting CPAP his sinus tachycardia improved greatly but says that during the day his HR is still elevated some as high as 101bpm. He does very well with his CPAP.  He uses [employer] for his supplies.  He tolerates the nasal pillow mask without any problems.  He no longer snores. He feels rested in the am and daytime sleepiness has improved significantly but occasionally has some sleepiness in the early afternoon.  He has a history of dyslipidemia and is on a statin and Zetia.  He denies any chest pain, SOB or DOE.  He plays soccer for exercise and gardens.  He denies any  Syncope but occasionally gets dizzy when standing up too fast.  He denies any LE edema but complains of cold feet in the past.     Past Medical History  Diagnosis Date  . Hyperlipidemia   . Chronic low back pain   . Ejection fraction   . Obstructive sleep apnea 12/06/2013    NPSG 11/14/13- Severe OSA, AHI 86/ hr with loud snore, desat ot 80%, titrated to CPAP 12, weight 215 lbs   . Sinus tachycardia ([place]) 10/27/2013    Persistent mild sinus tachycardia, October, 2015   //   48 hour Holter, October, 2015, rare PACs and PVCs. Heart rate range 70-128, mean heart rate 95 during the daytime, mean heart rate 85 during the resting hours.   //   Patient was diagnosed with severe sleep apnea. C Pap was started and his resting sinus tachycardia improved greatly.   . Family history of  premature CAD 03/20/2015    Past Surgical History  Procedure Laterality Date  . Hernia repair    . Rotator cuff surgery  2012    Dr. [name]  . Prostate biopsy N/A      Current Outpatient Prescriptions  Medication Sig Dispense Refill  . ANDROGEL PUMP 20.25 MG/ACT (1.62%) GEL 1 pump to both upper arm area twice daily (total 4 pumps daily) per Dr [name]    . atorvastatin (LIPITOR) 80 MG tablet Take 1 tablet (80 mg total) by mouth daily. 90 tablet 1  . canagliflozin (INVOKANA) 300 MG TABS tablet Take 300 mg by mouth daily before breakfast. 30 tablet 11  . CIALIS 5 MG tablet Take 5 mg by mouth daily as needed.     . DULoxetine (CYMBALTA) 60 MG capsule Take 150 mg by mouth daily. Per Dr Candis Schatz    . ezetimibe (ZETIA) 10 MG tablet TAKE ONE TABLET BY MOUTH ONCE DAILY 30 tablet 2  . fluticasone (FLONASE) 50 MCG/ACT nasal spray Place 1 spray into both nostrils daily as needed for allergies or rhinitis.    [name] glucosamine-chondroitin 500-400 MG tablet Take 1 tablet by mouth daily.     [name] LORazepam (ATIVAN) 0.5 MG tablet Take  1 tablet (0.5 mg total) by mouth as needed for anxiety. 30 tablet 5  . metFORMIN (GLUCOPHAGE) 500 MG tablet TAKE TWO TABLETS BY MOUTH TWICE DAILY WITH A MEAL 180 tablet 0  . Multiple Vitamin (MULTIVITAMIN) tablet Take 1 tablet by mouth daily.      . Psyllium (METAMUCIL) 30.9 % POWD Take 1 packet by mouth daily.      . zaleplon (SONATA) 5 MG capsule TAKE ONE CAPSULE BY MOUTH AT BEDTIME AS NEEDED FOR SLEEP 30 capsule 5  . zolpidem (AMBIEN) 10 MG tablet TAKE ONE TABLET BY MOUTH AT BEDTIME AS NEEDED FOR SLEEP 30 tablet 2  . glucose blood (BAYER CONTOUR NEXT TEST) test strip Use to test blood sugars daily. Dx: E11.9 100 each 12  . hydrOXYzine (ATARAX) 10 MG tablet Take 10 mg by mouth daily. Reported on 03/20/2015    . [DISCONTINUED] paroxetine mesylate (PEXEVA) 20 MG tablet Take 1 tablet (20 mg total) by mouth every morning. 60 tablet 3   No current facility-administered medications  for this visit.    Allergies:   Review of patient's allergies indicates no known allergies.    Social History:  The patient  reports that he quit smoking about 40 years ago. He has never used smokeless tobacco. He reports that he drinks about 1.2 oz of alcohol per week. He reports that he does not use illicit drugs.   Family History:  The patient's family history includes Diabetes in his brother and paternal grandfather; Heart disease (age of onset: 33) in his father; Hyperlipidemia in his brother and father; Hypertension in his brother and father.    ROS:  Please see the history of present illness.   Otherwise, review of systems are positive for none.   All other systems are reviewed and negative.    PHYSICAL EXAM: VS:  BP 122/84 mmHg  Pulse 95  Ht 5\' 10"  (1.778 m)  Wt 213 lb (96.616 kg)  BMI 30.56 kg/m2 , BMI Body mass index is 30.56 kg/(m^2). GEN: Well nourished, well developed, in no acute distress HEENT: normal Neck: no JVD, carotid bruits, or masses Cardiac: RRR; no murmurs, rubs, or gallops,no edema  Respiratory:  clear to auscultation bilaterally, normal work of breathing GI: soft, nontender, nondistended, + BS MS: no deformity or atrophy Skin: warm and dry, no rash Neuro:  Strength and sensation are intact Psych: euthymic mood, full affect   EKG:  EKG is not ordered today.    Recent Labs: 09/25/2014: ALT 31; BUN 16; Creatinine, Ser 0.99; Hemoglobin 17.2*; Platelets 163.0; Potassium 5.0; Sodium 142; TSH 1.02    Lipid Panel    Component Value Date/Time   CHOL 161 09/25/2014 0855   TRIG 143.0 09/25/2014 0855   TRIG 88 11/19/2005 1012   HDL 32.20* 09/25/2014 0855   CHOLHDL 5 09/25/2014 0855   CHOLHDL 4.5 CALC 11/19/2005 1012   VLDL 28.6 09/25/2014 0855   LDLCALC 101* 09/25/2014 0855   LDLDIRECT 168.2 10/29/2008 0813      Wt Readings from Last 3 Encounters:  03/20/15 213 lb (96.616 kg)  01/29/15 217 lb (98.431 kg)  11/23/14 215 lb (97.523 kg)         ASSESSMENT AND PLAN:  1.  Dyslipidemia - his LDL goal is < 100.  He will continue on Lipitor and Zetia.  His last LDL was 101.  We will recheck an FLP and ALT.  2.  Sinus tachycardia - resolved on CPAP at night but still mildly elevated during the day.  I will get a Holter monitor to assess average heart rate.  3.  OSA on CPAP and doing well.  Patient has been using and benefiting from CPAP use and will continue to benefit from therapy. I will get a d/l from DME. 4.  Family history of premature CAD - he has not had a stress test in years.  I will get an ETT to assess for silent ischemia.     Current medicines are reviewed at length with the patient today.  The patient does not have concerns regarding medicines.  The following changes have been made:  no change  Labs/ tests ordered today: See above Assessment and Plan No orders of the defined types were placed in this encounter.     Disposition:   FU with me in 1 year  Signed, Sueanne Margarita, MD  03/20/2015 9:52 AM    [facility] [place], [place], [place]  [address] Phone: [phone]; Fax: [phone]

## 2015-03-20 ENCOUNTER — Encounter: Payer: Self-pay | Admitting: Cardiology

## 2015-03-20 ENCOUNTER — Ambulatory Visit (INDEPENDENT_AMBULATORY_CARE_PROVIDER_SITE_OTHER): Payer: BLUE CROSS/BLUE SHIELD | Admitting: Cardiology

## 2015-03-20 VITALS — BP 122/84 | HR 95 | Ht 70.0 in | Wt 213.0 lb

## 2015-03-20 DIAGNOSIS — E663 Overweight: Secondary | ICD-10-CM

## 2015-03-20 DIAGNOSIS — G4733 Obstructive sleep apnea (adult) (pediatric): Secondary | ICD-10-CM

## 2015-03-20 DIAGNOSIS — E785 Hyperlipidemia, unspecified: Secondary | ICD-10-CM

## 2015-03-20 DIAGNOSIS — R000 Tachycardia, unspecified: Secondary | ICD-10-CM | POA: Diagnosis not present

## 2015-03-20 DIAGNOSIS — Z8249 Family history of ischemic heart disease and other diseases of the circulatory system: Secondary | ICD-10-CM

## 2015-03-20 HISTORY — DX: Family history of ischemic heart disease and other diseases of the circulatory system: Z82.49

## 2015-03-20 MED ORDER — EZETIMIBE 10 MG PO TABS: 10.0000 mg | ORAL_TABLET | Freq: Every day | ORAL | Status: DC

## 2015-03-20 NOTE — Patient Instructions (Signed)
Medication Instructions:  Your physician recommends that you continue on your current medications as directed. Please refer to the Current Medication list given to you today.   Labwork: Your physician recommends that you return for FASTING lab work within 1 week. (LFTs, lipids)  Testing/Procedures: Your physician has requested that you have an exercise tolerance test. For further information please visit [facility]. Please also follow instruction sheet, as given.  Your physician has recommended that you wear a holter monitor. Holter monitors are medical devices that record the heart's electrical activity. Doctors most often use these monitors to diagnose arrhythmias. Arrhythmias are problems with the speed or rhythm of the heartbeat. The monitor is a small, portable device. You can wear one while you do your normal daily activities. This is usually used to diagnose what is causing palpitations/syncope (passing out).  Follow-Up: Your physician wants you to follow-up in: 1 year with Dr. Radford Pax.  You will receive a reminder letter in the mail two months in advance. If you don't receive a letter, please call our office to schedule the follow-up appointment.   Any Other Special Instructions Will Be Listed Below (If Applicable).     If you need a refill on your cardiac medications before your next appointment, please call your pharmacy.

## 2015-03-20 NOTE — Addendum Note (Signed)
Addended by: Harland German A on: 03/20/2015 10:50 AM   Modules accepted: Orders

## 2015-03-29 ENCOUNTER — Ambulatory Visit (INDEPENDENT_AMBULATORY_CARE_PROVIDER_SITE_OTHER): Payer: BLUE CROSS/BLUE SHIELD

## 2015-03-29 ENCOUNTER — Telehealth: Payer: Self-pay

## 2015-03-29 ENCOUNTER — Other Ambulatory Visit (INDEPENDENT_AMBULATORY_CARE_PROVIDER_SITE_OTHER): Payer: BLUE CROSS/BLUE SHIELD | Admitting: *Deleted

## 2015-03-29 DIAGNOSIS — R000 Tachycardia, unspecified: Secondary | ICD-10-CM

## 2015-03-29 DIAGNOSIS — Z8249 Family history of ischemic heart disease and other diseases of the circulatory system: Secondary | ICD-10-CM | POA: Diagnosis not present

## 2015-03-29 DIAGNOSIS — E785 Hyperlipidemia, unspecified: Secondary | ICD-10-CM | POA: Diagnosis not present

## 2015-03-29 DIAGNOSIS — I10 Essential (primary) hypertension: Secondary | ICD-10-CM

## 2015-03-29 DIAGNOSIS — R9430 Abnormal result of cardiovascular function study, unspecified: Secondary | ICD-10-CM

## 2015-03-29 LAB — EXERCISE TOLERANCE TEST
CHL CUP MPHR: 163 {beats}/min
CHL CUP RESTING HR STRESS: 93 {beats}/min
CHL CUP STRESS STAGE 2 GRADE: 0 %
CHL CUP STRESS STAGE 2 SPEED: 1 mph
CHL CUP STRESS STAGE 3 GRADE: 0 %
CHL CUP STRESS STAGE 3 SPEED: 1 mph
CHL CUP STRESS STAGE 4 DBP: 84 mmHg
CHL CUP STRESS STAGE 4 GRADE: 10 %
CHL CUP STRESS STAGE 4 HR: 123 {beats}/min
CHL CUP STRESS STAGE 5 DBP: 82 mmHg
CHL CUP STRESS STAGE 5 SBP: 181 mmHg
CHL CUP STRESS STAGE 5 SPEED: 2.5 mph
CHL CUP STRESS STAGE 7 SPEED: 3.4 mph
CHL CUP STRESS STAGE 8 DBP: 84 mmHg
CHL CUP STRESS STAGE 8 GRADE: 0 %
CHL CUP STRESS STAGE 8 HR: 139 {beats}/min
CHL CUP STRESS STAGE 8 SBP: 198 mmHg
CHL CUP STRESS STAGE 8 SPEED: 1.5 mph
CHL CUP STRESS STAGE 9 DBP: 88 mmHg
CHL CUP STRESS STAGE 9 SBP: 153 mmHg
CHL CUP STRESS STAGE 9 SPEED: 0 mph
CHL RATE OF PERCEIVED EXERTION: 15
CSEPED: 9 min
CSEPEDS: 0 s
CSEPEW: 10.1 METS
CSEPHR: 95 %
CSEPPHR: 153 {beats}/min
CSEPPMHR: 93 %
Stage 1 DBP: 87 mmHg
Stage 1 Grade: 0 %
Stage 1 HR: 98 {beats}/min
Stage 1 SBP: 144 mmHg
Stage 1 Speed: 0 mph
Stage 2 HR: 102 {beats}/min
Stage 3 HR: 102 {beats}/min
Stage 4 SBP: 157 mmHg
Stage 4 Speed: 1.7 mph
Stage 5 Grade: 12 %
Stage 5 HR: 141 {beats}/min
Stage 6 DBP: 82 mmHg
Stage 6 Grade: 14 %
Stage 6 HR: 153 {beats}/min
Stage 6 SBP: 190 mmHg
Stage 6 Speed: 3.4 mph
Stage 7 Grade: 14.2 %
Stage 7 HR: 153 {beats}/min
Stage 9 Grade: 0 %
Stage 9 HR: 110 {beats}/min

## 2015-03-29 LAB — HEPATIC FUNCTION PANEL
ALBUMIN: 4.4 g/dL (ref 3.6–5.1)
ALK PHOS: 45 U/L (ref 40–115)
ALT: 37 U/L (ref 9–46)
AST: 25 U/L (ref 10–35)
BILIRUBIN INDIRECT: 0.6 mg/dL (ref 0.2–1.2)
Bilirubin, Direct: 0.1 mg/dL (ref <=0.2)
TOTAL PROTEIN: 6.5 g/dL (ref 6.1–8.1)
Total Bilirubin: 0.7 mg/dL (ref 0.2–1.2)

## 2015-03-29 LAB — LIPID PANEL
Cholesterol: 137 mg/dL (ref 125–200)
HDL: 32 mg/dL — ABNORMAL LOW (ref >=40)
LDL CALC: 72 mg/dL (ref <130)
Total CHOL/HDL Ratio: 4.3 Ratio (ref <=5.0)
Triglycerides: 166 mg/dL — ABNORMAL HIGH (ref <150)
VLDL: 33 mg/dL — ABNORMAL HIGH (ref <30)

## 2015-03-29 NOTE — Telephone Encounter (Signed)
Informed patient of results and verbal understanding expressed.  24 hour BP monitor ordered for scheduling. Patient agrees with treatment plan. 

## 2015-03-29 NOTE — Telephone Encounter (Signed)
-----   Message from Sueanne Margarita, MD sent at 03/29/2015  4:03 PM EST ----- Normal ETT with no ischemia but hypertensive BP response to exercise.  Please get 24 hour BP monitor

## 2015-03-29 NOTE — Addendum Note (Signed)
Addended by: Eulis Foster on: 03/29/2015 09:10 AM   Modules accepted: Orders

## 2015-04-02 ENCOUNTER — Encounter: Payer: Self-pay | Admitting: Family Medicine

## 2015-04-02 ENCOUNTER — Ambulatory Visit (INDEPENDENT_AMBULATORY_CARE_PROVIDER_SITE_OTHER): Payer: BLUE CROSS/BLUE SHIELD | Admitting: Family Medicine

## 2015-04-02 VITALS — BP 120/90 | HR 92 | Temp 98.1°F | Ht 70.0 in | Wt 214.0 lb

## 2015-04-02 DIAGNOSIS — E119 Type 2 diabetes mellitus without complications: Secondary | ICD-10-CM

## 2015-04-02 LAB — POCT GLYCOSYLATED HEMOGLOBIN (HGB A1C): Hemoglobin A1C: 6.1

## 2015-04-02 NOTE — Progress Notes (Signed)
   Subjective:    Patient ID: Brandon Frank, male    DOB: 1955-10-30, 60 y.o.   MRN: [id number]  HPI  Follow-up type 2 diabetes. Patient has recently seen cardiologist. He has obstructive sleep apnea and dyslipidemia and strong family history of CAD. Recent stress test showed no evidence for ischemia but hypertensive response. He has pending 24-hour ambulatory blood pressure monitor. Recent event monitor apparently unremarkable. He has some baseline tachycardia but no recent chest pains. Is exercising regularly.  Last A1c 6.2 percent.   Recent fasting blood sugars have been around 130 -150. No polyuria or polydipsia. He remains on metformin and invokana.  Past Medical History  Diagnosis Date  . Hyperlipidemia   . Chronic low back pain   . Ejection fraction   . Obstructive sleep apnea 12/06/2013    NPSG 11/14/13- Severe OSA, AHI 86/ hr with loud snore, desat ot 80%, titrated to CPAP 12, weight 215 lbs   . Sinus tachycardia ([place]) 10/27/2013    Persistent mild sinus tachycardia, October, 2015   //   48 hour Holter, October, 2015, rare PACs and PVCs. Heart rate range 70-128, mean heart rate 95 during the daytime, mean heart rate 85 during the resting hours.   //   Patient was diagnosed with severe sleep apnea. C Pap was started and his resting sinus tachycardia improved greatly.   . Family history of premature CAD 03/20/2015   Past Surgical History  Procedure Laterality Date  . Hernia repair    . Rotator cuff surgery  2012    Dr. [name]  . Prostate biopsy N/A     reports that he quit smoking about 40 years ago. He has never used smokeless tobacco. He reports that he drinks about 1.2 oz of alcohol per week. He reports that he does not use illicit drugs. family history includes Diabetes in his brother and paternal grandfather; Heart disease (age of onset: 28) in his father; Hyperlipidemia in his brother and father; Hypertension in his brother and father. No Known Allergies   Review of  Systems  Constitutional: Negative for fatigue.  Eyes: Negative for visual disturbance.  Respiratory: Negative for cough, chest tightness and shortness of breath.   Cardiovascular: Negative for chest pain, palpitations and leg swelling.  Endocrine: Negative for polydipsia and polyuria.  Genitourinary: Negative for dysuria.  Neurological: Negative for dizziness, syncope, weakness, light-headedness and headaches.       Objective:   Physical Exam  Constitutional: He appears well-developed and well-nourished.  Cardiovascular: Normal rate and regular rhythm.   Pulmonary/Chest: Effort normal and breath sounds normal. No respiratory distress. He has no wheezes. He has no rales.  Musculoskeletal: He exhibits no edema.          Assessment & Plan:  Type 2 diabetes. History of recent improved control. Recheck A1c today. Continue regular exercise habits.

## 2015-04-02 NOTE — Progress Notes (Signed)
Pre visit review using our clinic review tool, if applicable. No additional management support is needed unless otherwise documented below in the visit note. 

## 2015-04-03 ENCOUNTER — Telehealth: Payer: Self-pay | Admitting: *Deleted

## 2015-04-03 NOTE — Telephone Encounter (Signed)
[employer] called to report pt has a 24 hours Holter monitor. Pt being on 2 nd degree 2 to 1 conduction.  Left pt a message to call back.

## 2015-04-03 NOTE — Telephone Encounter (Signed)
Pt is calling back to get the results to his holter monitor

## 2015-04-03 NOTE — Telephone Encounter (Signed)
Patient st he did not have any symptoms while wearing the monitor. Informed patient he will be called when Dr. Radford Pax send the results. He requests a message left on his VM with monitor findings.

## 2015-04-03 NOTE — Telephone Encounter (Signed)
Please get copy of monitor strips

## 2015-04-03 NOTE — Telephone Encounter (Signed)
Second degree AV block was wenkebach and only 1 dropped beat occurred during sleep which is a normal finding during sleep due to high vagal tone.

## 2015-04-04 NOTE — Telephone Encounter (Signed)
-----   Message from Sueanne Margarita, MD sent at 04/03/2015 10:47 PM EDT ----- Heart monitor showed NSR with occasoinal PACs and PVCs which are benign.  He had 1 episode of Type I second degree AV block during sleep most likely secondary to high vagal tone and commonly seen during slee.  No new recs at this time.

## 2015-04-04 NOTE — Telephone Encounter (Signed)
Informed patient of results and verbal understanding expressed.  

## 2015-04-09 ENCOUNTER — Ambulatory Visit (INDEPENDENT_AMBULATORY_CARE_PROVIDER_SITE_OTHER): Payer: BLUE CROSS/BLUE SHIELD

## 2015-04-09 ENCOUNTER — Encounter: Payer: Self-pay | Admitting: *Deleted

## 2015-04-09 DIAGNOSIS — I10 Essential (primary) hypertension: Secondary | ICD-10-CM

## 2015-04-09 DIAGNOSIS — R9430 Abnormal result of cardiovascular function study, unspecified: Secondary | ICD-10-CM

## 2015-04-09 NOTE — Progress Notes (Signed)
Patient ID: Brandon Frank, male   DOB: [birth year], 60 y.o.   MRN: [id number] 24 hour ambulatory blood pressure monitor applied to patient.

## 2015-04-15 ENCOUNTER — Encounter: Payer: Self-pay | Admitting: Cardiology

## 2015-04-15 ENCOUNTER — Ambulatory Visit (INDEPENDENT_AMBULATORY_CARE_PROVIDER_SITE_OTHER): Payer: BLUE CROSS/BLUE SHIELD | Admitting: Family Medicine

## 2015-04-15 ENCOUNTER — Encounter: Payer: Self-pay | Admitting: Family Medicine

## 2015-04-15 VITALS — BP 130/80 | HR 88 | Temp 99.4°F | Wt 221.0 lb

## 2015-04-15 DIAGNOSIS — M5441 Lumbago with sciatica, right side: Secondary | ICD-10-CM | POA: Diagnosis not present

## 2015-04-15 MED ORDER — PREDNISONE 20 MG PO TABS: ORAL_TABLET | ORAL | Status: DC

## 2015-04-15 NOTE — Progress Notes (Signed)
Garret Reddish, MD  Subjective:  Brandon Frank is a 60 y.o. year old very pleasant male patient who presents for/with See problem oriented charting ROS- see below also no polyuria, dysuria  Past Medical History-  Patient Active Problem List   Diagnosis Date Noted  . Family history of premature CAD 03/20/2015  . Obstructive sleep apnea 12/06/2013  . Sinus tachycardia ([place]) 10/27/2013  . Ejection fraction   . Plantar fasciitis of right foot 10/21/2012  . Type 2 diabetes mellitus, controlled ([place]) 04/14/2012  . Anxiety 02/03/2011  . Overweight 10/23/2008  . PERIPHERAL NEUROPATHY 12/06/2006  . Insomnia 12/06/2006  . TRANSAMINASES, SERUM, ELEVATED 11/30/2005  . Hyperlipidemia 11/29/2005    Medications- reviewed and updated Current Outpatient Prescriptions  Medication Sig Dispense Refill  . ANDROGEL PUMP 20.25 MG/ACT (1.62%) GEL 1 pump to both upper arm area twice daily (total 4 pumps daily) per Dr [name]    . atorvastatin (LIPITOR) 80 MG tablet Take 1 tablet (80 mg total) by mouth daily. 90 tablet 1  . canagliflozin (INVOKANA) 300 MG TABS tablet Take 300 mg by mouth daily before breakfast. 30 tablet 11  . CIALIS 5 MG tablet Take 5 mg by mouth daily as needed.     . DULoxetine (CYMBALTA) 60 MG capsule Take 150 mg by mouth daily. Per Dr Candis Schatz    . ezetimibe (ZETIA) 10 MG tablet Take 1 tablet (10 mg total) by mouth daily. 90 tablet 3  . fluticasone (FLONASE) 50 MCG/ACT nasal spray Place 1 spray into both nostrils daily as needed for allergies or rhinitis.    [name] glucosamine-chondroitin 500-400 MG tablet Take 1 tablet by mouth daily.     [name] glucose blood (BAYER CONTOUR NEXT TEST) test strip Use to test blood sugars daily. Dx: E11.9 100 each 12  . metFORMIN (GLUCOPHAGE) 500 MG tablet TAKE TWO TABLETS BY MOUTH TWICE DAILY WITH A MEAL 180 tablet 0  . Multiple Vitamin (MULTIVITAMIN) tablet Take 1 tablet by mouth daily.      . Psyllium (METAMUCIL) 30.9 % POWD Take 1 packet by mouth daily.       . zaleplon (SONATA) 5 MG capsule TAKE ONE CAPSULE BY MOUTH AT BEDTIME AS NEEDED FOR SLEEP 30 capsule 5  . LORazepam (ATIVAN) 0.5 MG tablet Take 1 tablet (0.5 mg total) by mouth as needed for anxiety. (Patient not taking: Reported on 04/15/2015) 30 tablet 5  . predniSONE (DELTASONE) 20 MG tablet Take 2 pills for 3 days, 1 pill for 4 days 10 tablet 0  . zolpidem (AMBIEN) 10 MG tablet TAKE ONE TABLET BY MOUTH AT BEDTIME AS NEEDED FOR SLEEP (Patient not taking: Reported on 04/15/2015) 30 tablet 2  . [DISCONTINUED] paroxetine mesylate (PEXEVA) 20 MG tablet Take 1 tablet (20 mg total) by mouth every morning. 60 tablet 3   No current facility-administered medications for this visit.    Objective: BP 130/80 mmHg  Pulse 88  Temp(Src) 99.4 F (37.4 C)  Wt 221 lb (100.245 kg) Gen: NAD, resting comfortably CV: RRR no murmurs rubs or gallops Lungs: CTAB no crackles, wheeze, rhonchi Abdomen: no suprapubic pain.  No CVA tenderness.  Ext: no edema Back - Normal skin, Spine with normal alignment and no deformity.  No tenderness to vertebral process palpation.  Paraspinous muscles are  tender around L2 L3 bilaterally and with spasm.   Range of motion is full at neck and lumbar sacral regions. Negative Straight leg raise.  Neuro- no saddle anesthesia, 5/5 strength lower extremities, 2+ reflexes  Assessment/Plan:  Low Back pain (? Bilateral radiculopathy) S: Saturday picked up a railroad tie and had very sharp severe pain down center of his back. Saturday and Sunday did not do much and then today up walking around again. Had to work today and was rather active and pain seems to be worse after improving with rest. Hurts in lower portion of thoracic spine and hurts when he is up and walking- seems to tigthen up. Lying down seems to help the most. Feels it down into the hips but not past the knees. Has had similar pain to this in the past but never this bad. Had massage and helped while he was having it. Pain  10/10 at first now down to 6-7/10.  ROS-No saddle anesthesia, bladder incontinence, fecal incontinence, weakness in extremity, numbness or tingling in extremity (no change from baseline- has some in his feet at baseline). History negative for trauma, history of cancer, fever, chills, unintentional weight loss, recent bacterial infection, recent IV drug use, HIV, pain worse at night or while supine.  A/P: Suspect low back strain. ? Bilateral radiculopathy but doubt- he had good success a year ago with prednisone taper and wants to retrial. Discussed that he had clear radiculopathy last year and that's likely why he had benefit. Unclear if will benefit at present but he would still like to trial. A1c has been very well controlled as per below and he will watch his sugars. Given 2 episodes since 2016- refer to PT for core strengthening program.   Lab Results  Component Value Date   HGBA1C 6.1 04/02/2015   Return precautions advised.   Orders Placed This Encounter  Procedures  . Ambulatory referral to Physical Therapy    Referral Priority:  Routine    Referral Type:  Physical Medicine    Referral Reason:  Specialty Services Required    Requested Specialty:  Physical Therapy    Number of Visits Requested:  1    Meds ordered this encounter  Medications  . predniSONE (DELTASONE) 20 MG tablet    Sig: Take 2 pills for 3 days, 1 pill for 4 days    Dispense:  10 tablet    Refill:  0    Of note- prior back pain had completely resolved so this is a new acute episode.

## 2015-04-15 NOTE — Patient Instructions (Signed)
Low back pain  Send for PT for core strengthening  Trial prednisone 7 days since diabetes has been controlled- watch your sugars to make sure not skyrocketing  If prednisone does not improve this- suspect may be 4-6 weeks of pain. If hurts longer than that need to return to care or if any of the red flags like weakness in legs, peeing or pooping on yourself, worsening pain

## 2015-04-15 NOTE — Telephone Encounter (Signed)
This encounter was created in error - please disregard.

## 2015-04-17 ENCOUNTER — Telehealth: Payer: Self-pay

## 2015-04-17 DIAGNOSIS — I10 Essential (primary) hypertension: Secondary | ICD-10-CM

## 2015-04-17 MED ORDER — LISINOPRIL 10 MG PO TABS: 10.0000 mg | ORAL_TABLET | Freq: Every day | ORAL | Status: DC

## 2015-04-17 NOTE — Telephone Encounter (Signed)
Reviewed results of BP monitor with patient and informed him his average BP was 151/88. Per Dr. Radford Pax, instructed patient to START LISINOPRIL 10 mg daily. BMET scheduled 4/7. Patient agrees with treatment plan.

## 2015-04-23 ENCOUNTER — Ambulatory Visit: Payer: BLUE CROSS/BLUE SHIELD | Attending: Family Medicine

## 2015-04-23 DIAGNOSIS — M545 Low back pain, unspecified: Secondary | ICD-10-CM

## 2015-04-23 DIAGNOSIS — M25651 Stiffness of right hip, not elsewhere classified: Secondary | ICD-10-CM | POA: Diagnosis present

## 2015-04-23 DIAGNOSIS — M25652 Stiffness of left hip, not elsewhere classified: Secondary | ICD-10-CM

## 2015-04-23 NOTE — Patient Instructions (Signed)
Perform all exercises below:  Hold _20___ seconds. Repeat _3___ times.  Do __3__ sessions per day. CAUTION: Movement should be gentle, steady and slow.  Knee to Chest  Lying supine, bend involved knee to chest. Perform with each leg.   Lumbar Rotation: Caudal - Bilateral (Supine)  Feet and knees together, arms outstretched, rotate knees left, turning head in opposite direction, until stretch is felt.      HIP: Hamstrings - Short Sitting     Keep knee straight. Lift chest.    Cervico-Thoracic: Extension / Rotation (Sitting)    Reach across body with left arm and grasp back of chair. Gently look over right side shoulder. Hold __20__ seconds. Relax. Repeat _3___ times per set. Do _1___ sets per session. Do __3__ sessions per day.  http://orth.exer.us/980   Copyright  VHI. All rights reserved.    [facility] [address], [place],  [address] Phone # [phone] Fax [phone]

## 2015-04-23 NOTE — Therapy (Signed)
[facility] [address], [place], [place], [address] Phone: [phone]   Fax:  [phone]  Physical Therapy Evaluation  Patient Details  Name: Brandon Frank MRN: [id number] Date of Birth: 1955-12-06 Referring Provider: Garret Reddish, MD  Encounter Date: 04/23/2015      PT End of Session - 04/23/15 0914    Visit Number 1   Date for PT Re-Evaluation 06/18/15   PT Start Time 0843   PT Stop Time 0922   PT Time Calculation (min) 39 min   Activity Tolerance Patient tolerated treatment well   Behavior During Therapy [facility] for tasks assessed/performed      Past Medical History  Diagnosis Date  . Hyperlipidemia   . Chronic low back pain   . Ejection fraction   . Obstructive sleep apnea 12/06/2013    NPSG 11/14/13- Severe OSA, AHI 86/ hr with loud snore, desat ot 80%, titrated to CPAP 12, weight 215 lbs   . Sinus tachycardia ([place]) 10/27/2013    Persistent mild sinus tachycardia, October, 2015   //   48 hour Holter, October, 2015, rare PACs and PVCs. Heart rate range 70-128, mean heart rate 95 during the daytime, mean heart rate 85 during the resting hours.   //   Patient was diagnosed with severe sleep apnea. C Pap was started and his resting sinus tachycardia improved greatly.   . Family history of premature CAD 03/20/2015    Past Surgical History  Procedure Laterality Date  . Hernia repair    . Rotator cuff surgery  2012    Dr. [name]  . Prostate biopsy N/A     There were no vitals filed for this visit.  Visit Diagnosis:  Bilateral low back pain without sciatica - Plan: PT plan of care cert/re-cert  Stiffness of left hip, not elsewhere classified - Plan: PT plan of care cert/re-cert  Stiffness of right hip, not elsewhere classified - Plan: PT plan of care cert/re-cert      Subjective Assessment - 04/23/15 0847    Subjective Pt presents to PT with compliaints of LBP.  Pt reports that he was lifting a rail  road tie 10 days ago and felt shooting pain in the middle of his low back.  Pt took steroid and used rest to control symptoms.     Pertinent History coccyx fracture   Patient Stated Goals learn core exercises to prevent flare-up of pain, reduce LBP   Currently in Pain? Yes   Pain Score 4    Pain Location Back   Pain Orientation Right;Left;Lower   Pain Descriptors / Indicators Aching;Burning   Pain Type Acute pain   Pain Onset 1 to 4 weeks ago   Pain Frequency Constant   Aggravating Factors  unknown- fairly constant pain.  Activity at times   Pain Relieving Factors Advil, change of position            [facility] PT Assessment - 04/23/15 0001    Assessment   Medical Diagnosis Bil low back pain with Rt sided sciatica   Referring Provider Garret Reddish, MD   Onset Date/Surgical Date 04/13/15   Next MD Visit none   Prior Therapy none   Precautions   Precautions None   Restrictions   Weight Bearing Restrictions No   Balance Screen   Has the patient fallen in the past 6 months No   Has the patient had a decrease in activity level because of a fear of falling?  No  Is the patient reluctant to leave their home because of a fear of falling?  No   Home Environment   Living Environment Private residence   Living Arrangements Spouse/significant other   Type of [place] to enter   Home Layout Two level   Prior Function   Level of Independence Independent   Vocation Full time employment   Vocation Requirements sales: sitting and driving   Leisure gardening, walking 2-3 times a week   Cognition   Overall Cognitive Status Within Functional Limits for tasks assessed   Observation/Other Assessments   Focus on Therapeutic Outcomes (FOTO)  51% limitation   Posture/Postural Control   Posture/Postural Control Postural limitations   Postural Limitations Forward head;Decreased lumbar lordosis   ROM / Strength   AROM / PROM / Strength AROM;PROM;Strength   AROM    Overall AROM  Within functional limits for tasks performed   Overall AROM Comments Full lumbar AROM with pain reported at the thoracolumbar junction with all lumbar AROM   PROM   Overall PROM  Deficits   Overall PROM Comments hip flexibility limited by 25% bilaterally without pain   Strength   Overall Strength Within functional limits for tasks performed   Overall Strength Comments 4+/5 to 5/5 bilateral LE stnength throughout   Palpation   Spinal mobility reduced PA mobility in the thoracic and lumbar spine with PA mobs.  Pain with mobs at T10-L3.   Palpation comment muscle tension in bilateral lumbar and thoracic paraspinals T8-L5.   Special Tests    Special Tests Lumbar   Lumbar Tests Straight Leg Raise   Straight Leg Raise   Findings Negative   Side  Right   Ambulation/Gait   Ambulation/Gait Yes   Gait Pattern Within Functional Limits                           PT Education - 04/23/15 0914    Education provided Yes   Education Details HEP: lumbar and hip flexiblity   Person(s) Educated Patient   Methods Explanation;Demonstration;Handout   Comprehension Verbalized understanding;Returned demonstration          PT Short Term Goals - 04/23/15 0925    PT SHORT TERM GOAL #1   Title be independent in initial HEP   Time 8   Period Weeks   Status New   PT SHORT TERM GOAL #2   Title report 50% less lumbar pain with gardening tasks   Time 8   Period Weeks   Status New   PT SHORT TERM GOAL #3   Title demonnstrate and verbalize correct body mechanics for home and work tasks   Time 8   Period Weeks   Status New           PT Long Term Goals - 04/23/15 [id number]    PT LONG TERM GOAL #1   Title be independent in advanced HEP   Time 8   Period Weeks   Status New   PT LONG TERM GOAL #2   Title reduce FOTO to < or = to 31% limitation   Time 8   Period Weeks   Status New   PT LONG TERM GOAL #3   Title perform gardening without pain in the low back    Time 8   Period Weeks   Status New   PT LONG TERM GOAL #4   Title report a 75% reduction in the frequency and  intensity of LBP with home and work tasks   Time [place] - 04/23/15 0915    Clinical Impression Statement Pt presents to PT with compliatins of LBP that began ~10 days ago when lifitng a heavy object.  He finished a course of steroids and is feeling better.  Pt would like to learn exercises for core strength, receive body mechanics education and address pain as needed.  Pt demonstrates limited mobility in the thoracic and lumbar spine, hip stiffness and painful lumbar mobiltiy.  Pt will benefit from skilled PT for core strength progression, body mechanics education, lumbar/hipflexibility and pain management as needed.     Pt will benefit from skilled therapeutic intervention in order to improve on the following deficits Postural dysfunction;Impaired flexibility;Improper body mechanics;Pain;Decreased activity tolerance;Decreased range of motion   Rehab Potential Good   PT Frequency 2x / week   PT Duration 8 weeks   PT Treatment/Interventions ADLs/Self Care Home Management;Cryotherapy;Electrical Stimulation;Moist Heat;Therapeutic exercise;Therapeutic activities;Functional mobility training;Ultrasound;Neuromuscular re-education;Patient/family education;Manual techniques;Dry needling;Passive range of motion   PT Next Visit Plan Begin level 1 core strength, body mechanics for work tasks and gardening, thoracic/lumbar joint mobs, pain management as needed.   Consulted and Agree with Plan of Care Patient         Problem List Patient Active Problem List   Diagnosis Date Noted  . Family history of premature CAD 03/20/2015  . Obstructive sleep apnea 12/06/2013  . Sinus tachycardia ([place]) 10/27/2013  . Ejection fraction   . Plantar fasciitis of right foot 10/21/2012  . Type 2 diabetes mellitus, controlled ([place]) 04/14/2012  . Anxiety  02/03/2011  . Overweight 10/23/2008  . PERIPHERAL NEUROPATHY 12/06/2006  . Insomnia 12/06/2006  . TRANSAMINASES, SERUM, ELEVATED 11/30/2005  . Hyperlipidemia 11/29/2005    Sigurd Sos, PT 04/23/2015 9:30 AM  [facility] [address] [place], [place], [address] Phone: [phone]   Fax:  [phone]  Name: Brandon Frank MRN: [id number] Date of Birth: 05-26-1955

## 2015-04-26 ENCOUNTER — Ambulatory Visit: Payer: BLUE CROSS/BLUE SHIELD | Admitting: Physical Therapy

## 2015-04-26 ENCOUNTER — Encounter: Payer: Self-pay | Admitting: Physical Therapy

## 2015-04-26 ENCOUNTER — Other Ambulatory Visit (INDEPENDENT_AMBULATORY_CARE_PROVIDER_SITE_OTHER): Payer: BLUE CROSS/BLUE SHIELD | Admitting: *Deleted

## 2015-04-26 DIAGNOSIS — M25651 Stiffness of right hip, not elsewhere classified: Secondary | ICD-10-CM

## 2015-04-26 DIAGNOSIS — M545 Low back pain, unspecified: Secondary | ICD-10-CM

## 2015-04-26 DIAGNOSIS — M25652 Stiffness of left hip, not elsewhere classified: Secondary | ICD-10-CM

## 2015-04-26 DIAGNOSIS — I10 Essential (primary) hypertension: Secondary | ICD-10-CM

## 2015-04-26 LAB — BASIC METABOLIC PANEL
BUN: 16 mg/dL (ref 7–25)
CALCIUM: 9.7 mg/dL (ref 8.6–10.3)
CO2: 24 mmol/L (ref 20–31)
CREATININE: 1.11 mg/dL (ref 0.70–1.33)
Chloride: 97 mmol/L — ABNORMAL LOW (ref 98–110)
Glucose, Bld: 134 mg/dL — ABNORMAL HIGH (ref 65–99)
Potassium: 4.5 mmol/L (ref 3.5–5.3)
Sodium: 135 mmol/L (ref 135–146)

## 2015-04-26 NOTE — Therapy (Signed)
[facility] [address] [place], [place], [address] Phone: [phone]   Fax:  [phone]  Physical Therapy Treatment  Patient Details  Name: Brandon Frank MRN: [id number] Date of Birth: 09-Mar-1955 Referring Provider: Garret Reddish, MD  Encounter Date: 04/26/2015      PT End of Session - 04/26/15 0803    Visit Number 2   Date for PT Re-Evaluation 06/18/15   PT Start Time 0800   PT Stop Time 0841   PT Time Calculation (min) 41 min   Activity Tolerance Patient tolerated treatment well   Behavior During Therapy [facility] for tasks assessed/performed      Past Medical History  Diagnosis Date  . Hyperlipidemia   . Chronic low back pain   . Ejection fraction   . Obstructive sleep apnea 12/06/2013    NPSG 11/14/13- Severe OSA, AHI 86/ hr with loud snore, desat ot 80%, titrated to CPAP 12, weight 215 lbs   . Sinus tachycardia ([place]) 10/27/2013    Persistent mild sinus tachycardia, October, 2015   //   48 hour Holter, October, 2015, rare PACs and PVCs. Heart rate range 70-128, mean heart rate 95 during the daytime, mean heart rate 85 during the resting hours.   //   Patient was diagnosed with severe sleep apnea. C Pap was started and his resting sinus tachycardia improved greatly.   . Family history of premature CAD 03/20/2015    Past Surgical History  Procedure Laterality Date  . Hernia repair    . Rotator cuff surgery  2012    Dr. [name]  . Prostate biopsy N/A     There were no vitals filed for this visit.      Subjective Assessment - 04/26/15 0802    Subjective Mild LBP this AM. No leg pain.   Currently in Pain? Yes   Pain Score 4    Pain Location Back   Pain Orientation Right;Left;Lower   Pain Descriptors / Indicators Aching   Multiple Pain Sites No                         [facility] Adult PT Treatment/Exercise - 04/26/15 0001    Lumbar Exercises: Stretches   Single Knee to Chest Stretch 3  reps;20 seconds   Single Knee to Chest Stretch Limitations --  Knee to opposite shoulder 2x 20 sec bil   Lower Trunk Rotation 3 reps;20 seconds   Piriformis Stretch 3 reps;20 seconds                PT Education - 04/26/15 0809    Education provided Yes   Education Details HEP: Piriformis and lateral hip stretch, Level one Stabs, lumbar protective body mechanics for ADLS   Person(s) Educated Patient   Methods Explanation;Demonstration;Tactile cues;Verbal cues;Handout   Comprehension Verbalized understanding;Returned demonstration          PT Short Term Goals - 04/23/15 0925    PT SHORT TERM GOAL #1   Title be independent in initial HEP   Time 8   Period Weeks   Status New   PT SHORT TERM GOAL #2   Title report 50% less lumbar pain with gardening tasks   Time 8   Period Weeks   Status New   PT SHORT TERM GOAL #3   Title demonnstrate and verbalize correct body mechanics for home and work tasks   Time 8   Period Weeks   Status New  PT Long Term Goals - 04/23/15 0839    PT LONG TERM GOAL #1   Title be independent in advanced HEP   Time 8   Period Weeks   Status New   PT LONG TERM GOAL #2   Title reduce FOTO to < or = to 31% limitation   Time 8   Period Weeks   Status New   PT LONG TERM GOAL #3   Title perform gardening without pain in the low back   Time 8   Period Weeks   Status New   PT LONG TERM GOAL #4   Title report a 75% reduction in the frequency and intensity of LBP with home and work tasks   Time 8   Period Weeks   Status New               Plan - 04/26/15 0822    Clinical Impression Statement Educated pt in lumbar protective body mechanics and level one core stabs for HEP today. Pt is compliant with initial stretching.    Rehab Potential Good   PT Frequency 2x / week   PT Duration 8 weeks   PT Treatment/Interventions ADLs/Self Care Home Management;Cryotherapy;Electrical Stimulation;Moist Heat;Therapeutic  exercise;Therapeutic activities;Functional mobility training;Ultrasound;Neuromuscular re-education;Patient/family education;Manual techniques;Dry needling;Passive range of motion   PT Next Visit Plan Review what pt got today for HEP, then progress   Consulted and Agree with Plan of Care Patient      Patient will benefit from skilled therapeutic intervention in order to improve the following deficits and impairments:  Postural dysfunction, Impaired flexibility, Improper body mechanics, Pain, Decreased activity tolerance, Decreased range of motion  Visit Diagnosis: Bilateral low back pain without sciatica  Stiffness of left hip, not elsewhere classified  Stiffness of right hip, not elsewhere classified     Problem List Patient Active Problem List   Diagnosis Date Noted  . Family history of premature CAD 03/20/2015  . Obstructive sleep apnea 12/06/2013  . Sinus tachycardia ([place]) 10/27/2013  . Ejection fraction   . Plantar fasciitis of right foot 10/21/2012  . Type 2 diabetes mellitus, controlled ([place]) 04/14/2012  . Anxiety 02/03/2011  . Overweight 10/23/2008  . PERIPHERAL NEUROPATHY 12/06/2006  . Insomnia 12/06/2006  . TRANSAMINASES, SERUM, ELEVATED 11/30/2005  . Hyperlipidemia 11/29/2005    Brandon Frank, PTA 04/26/2015, 8:44 AM  [facility] [address] [place], [place], [address] Phone: [phone]   Fax:  [phone]  Name: Brandon Frank MRN: [id number] Date of Birth: December 27, 1955

## 2015-04-26 NOTE — Patient Instructions (Signed)
Piriformis (Supine)    Cross legs, right on top. Gently pull other knee toward chest until stretch is felt in buttock/hip of top leg. Hold __20__ seconds. Repeat _3___ times per set. Do __1__ sets per session. Do ___2_ sessions per day.  Knee to Chest (Flexion)    Pull knee toward opposite shoulder/ chest. Feel stretch in hip  or buttock area. Breathing deeply, Hold __20__ seconds. Repeat with other knee. Repeat ___2_ times. Do _2__ sessions per day.  http://gt2.exer.us/225   Lower abdominal/core stability exercises  1. Practice your breathing technique: Inhale through your nose expanding your belly and rib cage. Try not to breathe into your chest. Exhale slowly and gradually out your mouth feeling a sense of softness to your body. Practice multiple times. This can be performed unlimited.  2. Finding the lower abdominals. Laying on your back with the knees bent, place your fingers just below your belly button. Using your breathing technique from above, on your exhale gently pull the belly button away from your fingertips without tensing any other muscles. Practice this 5x. Next, as you exhale, draw belly button inwards and hold onto it...then feel as if you are pulling that muscle across your pelvis like you are tightening a belt. This can be hard to do at first so be patient and practice. Do 5-10 reps 1-3 x day. Always recognize quality over quantity; if your abdominal muscles become tired you will notice you may tighten/contract other muscles. This is the time to take a break.   Practice this first laying on your back, then in sitting, progressing to standing and finally adding it to all your daily movements.   3. Finding your pelvic floor. Using the breathing technique above, when your exhale, this time draw your pelvic floor muscles up as if you were attempting to stop the flow of urination. Be careful NOT to tense any other muscles. This can be hard, BE PATIENT. Try to hold up to 10  seconds repeating 10x. Try 2x a day. Once you feel you are doing this well, add this contraction to exercise #2. First contracting your pelvic floor followed by lower abdominals.  4. Adding leg movements. Add the following leg movements to challenge your ability to keep your core stable:  1. Single leg drop outs: Laying on your back with knees bent feet flat. Inhale,  dropping one knee outward KEEPING YOUR PELVIS STILL. Exhale as you bring the leg back, simultaneously performing your lower abdominal contraction. Do 5-10 on each leg.  2. Marching: While keeping your pelvis still, lift the right foot a few inches, put it down then lift left foot. This will mimic a march. Start slow to establish control. Once you have control you may speed it up. Do 10-20x. You MUST keep your lower abdominlas contracted while you march. Breathe naturally   3. Single leg slides: Inhale while you slowly slide one leg out keeping your pelvis still. Only slide your leg as far as you can keep your pelvis still. Exhale as you bring the leg back to the start, contracting the lower abdominals as you do that. Keep your upper body relaxed. Do 5-10 on each side.  Lower abdominal/core stability exercises  1. Practice your breathing technique: Inhale through your nose expanding your belly and rib cage. Try not to breathe into your chest. Exhale slowly and gradually out your mouth feeling a sense of softness to your body. Practice multiple times. This can be performed unlimited.  2. Finding the lower  abdominals. Laying on your back with the knees bent, place your fingers just below your belly button. Using your breathing technique from above, on your exhale gently pull the belly button away from your fingertips without tensing any other muscles. Practice this 5x. Next, as you exhale, draw belly button inwards and hold onto it...then feel as if you are pulling that muscle across your pelvis like you are tightening a belt. This can be hard  to do at first so be patient and practice. Do 5-10 reps 1-3 x day. Always recognize quality over quantity; if your abdominal muscles become tired you will notice you may tighten/contract other muscles. This is the time to take a break.   Practice this first laying on your back, then in sitting, progressing to standing and finally adding it to all your daily movements.   3. Finding your pelvic floor. Using the breathing technique above, when your exhale, this time draw your pelvic floor muscles up as if you were attempting to stop the flow of urination. Be careful NOT to tense any other muscles. This can be hard, BE PATIENT. Try to hold up to 10 seconds repeating 10x. Try 2x a day. Once you feel you are doing this well, add this contraction to exercise #2. First contracting your pelvic floor followed by lower abdominals.   4. Adding leg movements. Add the following leg movements to challenge your ability to keep your core stable:  1. Single leg drop outs: Laying on your back with knees bent feet flat. Inhale,  dropping one knee outward KEEPING YOUR PELVIS STILL. Exhale as you bring the leg back, simultaneously performing your lower abdominal contraction. Do 5-10 on each leg.   2. Marching: While keeping your pelvis still, lift the right foot a few inches, put it down then lift left foot. This will mimic a march. Start slow to establish control. Once you have control you may speed it up. Do 10-20x. You MUST keep your lower abdominlas contracted while you march. Breathe naturally    3. Single leg slides: Inhale while you slowly slide one leg out keeping your pelvis still. Only slide your leg as far as you can keep your pelvis still. Exhale as you bring the leg back to the start, contracting the lower abdominals as you do that. Keep your upper body relaxed. Do 5-10 on each side.          Copyright  VHI. All rights reserved.  http://orth.exer.us/676    Sleeping on Back  Place pillow under  knees. A pillow with cervical support and a roll around waist are also helpful. Copyright  VHI. All rights reserved.  Sleeping on Side Place pillow between knees. Use cervical support under neck and a roll around waist as needed. Copyright  VHI. All rights reserved.   Sleeping on Stomach   If this is the only desirable sleeping position, place pillow under lower legs, and under stomach or chest as needed.  Posture - Sitting   Sit upright, head facing forward. Try using a roll to support lower back. Keep shoulders relaxed, and avoid rounded back. Keep hips level with knees. Avoid crossing legs for long periods. Stand to Sit / Sit to Stand   To sit: Bend knees to lower self onto front edge of chair, then scoot back on seat. To stand: Reverse sequence by placing one foot forward, and scoot to front of seat. Use rocking motion to stand up.   Work Height and [employer] work  height is no more than 2 to 4 inches below elbow level when standing, and at elbow level when sitting. Reaching should be limited to arm's length, with elbows slightly bent.  Bending  Bend at hips and knees, not back. Keep feet shoulder-width apart.    Posture - Standing   Good posture is important. Avoid slouching and forward head thrust. Maintain curve in low back and align ears over shoul- ders, hips over ankles.  Alternating Positions   Alternate tasks and change positions frequently to reduce fatigue and muscle tension. Take rest breaks. Computer Work   Position work to Programmer, multimedia. Use proper work and seat height. Keep shoulders back and down, wrists straight, and elbows at right angles. Use chair that provides full back support. Add footrest and lumbar roll as needed.  Getting Into / Out of Car  Lower self onto seat, scoot back, then bring in one leg at a time. Reverse sequence to get out.  Dressing  Lie on back to pull socks or slacks over feet, or sit and bend leg while keeping back  straight.    Housework - Sink  Place one foot on ledge of cabinet under sink when standing at sink for prolonged periods.   Pushing / Pulling  Pushing is preferable to pulling. Keep back in proper alignment, and use leg muscles to do the work.  Deep Squat   Squat and lift with both arms held against upper trunk. Tighten stomach muscles without holding breath. Use smooth movements to avoid jerking.  Avoid Twisting   Avoid twisting or bending back. Pivot around using foot movements, and bend at knees if needed when reaching for articles.  Carrying Luggage   Distribute weight evenly on both sides. Use a cart whenever possible. Do not twist trunk. Move body as a unit.   Lifting Principles .Maintain proper posture and head alignment. .Slide object as close as possible before lifting. .Move obstacles out of the way. .Test before lifting; ask for help if too heavy. .Tighten stomach muscles without holding breath. .Use smooth movements; do not jerk. .Use legs to do the work, and pivot with feet. .Distribute the work load symmetrically and close to the center of trunk. .Push instead of pull whenever possible.   Ask For Help   Ask for help and delegate to others when possible. Coordinate your movements when lifting together, and maintain the low back curve.  Log Roll   Lying on back, bend left knee and place left arm across chest. Roll all in one movement to the right. Reverse to roll to the left. Always move as one unit. Housework - Sweeping  Use long-handled equipment to avoid stooping.   Housework - Wiping  Position yourself as close as possible to reach work surface. Avoid straining your back.  Laundry - Unloading Wash   To unload small items at bottom of washer, lift leg opposite to arm being used to reach.  [place] close to area to be raked. Use arm movements to do the work. Keep back straight and avoid twisting.     Cart  When  reaching into cart with one arm, lift opposite leg to keep back straight.   Getting Into / Out of Bed  Lower self to lie down on one side by raising legs and lowering head at the same time. Use arms to assist moving without twisting. Bend both knees to roll onto back if desired. To sit up, start from lying on side,  and use same move-ments in reverse. Housework - Vacuuming  Hold the vacuum with arm held at side. Step back and forth to move it, keeping head up. Avoid twisting.   Laundry - IT consultant so that bending and twisting can be avoided.   Laundry - Unloading Dryer  Squat down to reach into clothes dryer or use a reacher.  Gardening - Weeding / Probation officer or Kneel. Knee pads may be helpful.                   Copyright  VHI. All rights reserved.

## 2015-05-01 ENCOUNTER — Ambulatory Visit: Payer: BLUE CROSS/BLUE SHIELD

## 2015-05-01 DIAGNOSIS — M25651 Stiffness of right hip, not elsewhere classified: Secondary | ICD-10-CM

## 2015-05-01 DIAGNOSIS — M545 Low back pain, unspecified: Secondary | ICD-10-CM

## 2015-05-01 DIAGNOSIS — M25652 Stiffness of left hip, not elsewhere classified: Secondary | ICD-10-CM

## 2015-05-01 NOTE — Therapy (Signed)
[facility] [address] [place], [place], [address] Phone: [phone]   Fax:  [phone]  Physical Therapy Treatment  Patient Details  Name: Brandon Frank MRN: [id number] Date of Birth: [birth year] Referring Provider: Garret Reddish, MD  Encounter Date: 05/01/2015      PT End of Session - 05/01/15 0840    Visit Number 3   Date for PT Re-Evaluation 06/18/15   PT Start Time 0800   PT Stop Time 0840   PT Time Calculation (min) 40 min   Activity Tolerance Patient tolerated treatment well   Behavior During Therapy [facility] for tasks assessed/performed      Past Medical History  Diagnosis Date  . Hyperlipidemia   . Chronic low back pain   . Ejection fraction   . Obstructive sleep apnea 12/06/2013    NPSG 11/14/13- Severe OSA, AHI 86/ hr with loud snore, desat ot 80%, titrated to CPAP 12, weight 215 lbs   . Sinus tachycardia ([place]) 10/27/2013    Persistent mild sinus tachycardia, October, 2015   //   48 hour Holter, October, 2015, rare PACs and PVCs. Heart rate range 70-128, mean heart rate 95 during the daytime, mean heart rate 85 during the resting hours.   //   Patient was diagnosed with severe sleep apnea. C Pap was started and his resting sinus tachycardia improved greatly.   . Family history of premature CAD 03/20/2015    Past Surgical History  Procedure Laterality Date  . Hernia repair    . Rotator cuff surgery  2012    Dr. [name]  . Prostate biopsy N/A     There were no vitals filed for this visit.      Subjective Assessment - 05/01/15 0806    Subjective I have been stretching but not as consistently as I should.  Working on Pharmacist, hospital.     Patient Stated Goals learn core exercises to prevent flare-up of pain, reduce LBP   Currently in Pain? Yes   Pain Score 3    Pain Location Back   Pain Orientation Right;Left;Lower   Pain Descriptors / Indicators Aching;Dull   Pain Type Acute pain    Pain Onset More than a month ago   Pain Frequency Constant   Aggravating Factors  fairly constant, activity   Pain Relieving Factors Advil, change of position                         [facility] Adult PT Treatment/Exercise - 05/01/15 0001    Lumbar Exercises: Stretches   Single Knee to Chest Stretch 3 reps;20 seconds   Lower Trunk Rotation 3 reps;20 seconds   Piriformis Stretch 3 reps;20 seconds   Lumbar Exercises: Supine   Ab Set 5 reps;5 seconds   AB Set Limitations Level 1 core stabs                  PT Short Term Goals - 05/01/15 0806    PT SHORT TERM GOAL #1   Title be independent in initial HEP   Status Achieved   PT SHORT TERM GOAL #2   Title report 50% less lumbar pain with gardening tasks   Time 4   Period Weeks   Status On-going  10% better   PT SHORT TERM GOAL #3   Title demonnstrate and verbalize correct body mechanics for home and work tasks   Status Achieved  PT Long Term Goals - 04/23/15 0839    PT LONG TERM GOAL #1   Title be independent in advanced HEP   Time 8   Period Weeks   Status New   PT LONG TERM GOAL #2   Title reduce FOTO to < or = to 31% limitation   Time 8   Period Weeks   Status New   PT LONG TERM GOAL #3   Title perform gardening without pain in the low back   Time 8   Period Weeks   Status New   PT LONG TERM GOAL #4   Title report a 75% reduction in the frequency and intensity of LBP with home and work tasks   Time [place] - 05/01/15 0808    Clinical Impression Statement Pt has received body mechanics education and is working on applying techniques with home tassk.  Pt reports 10% overall improvement in symptoms since the start of care.  Pt has been able to perform gardening and didn't have increase in pain.  Pt has tolerated advancement of strength and flexibiltiy well.  Pt will continue to benefit from skilled PT for flexibility, strength and  endurance progression.     Rehab Potential Good   PT Frequency 2x / week   PT Duration 8 weeks   PT Treatment/Interventions ADLs/Self Care Home Management;Cryotherapy;Electrical Stimulation;Moist Heat;Therapeutic exercise;Therapeutic activities;Functional mobility training;Ultrasound;Neuromuscular re-education;Patient/family education;Manual techniques;Dry needling;Passive range of motion   PT Next Visit Plan continue flexibility and core strength   Consulted and Agree with Plan of Care Patient      Patient will benefit from skilled therapeutic intervention in order to improve the following deficits and impairments:  Postural dysfunction, Impaired flexibility, Improper body mechanics, Pain, Decreased activity tolerance, Decreased range of motion  Visit Diagnosis: Bilateral low back pain without sciatica  Stiffness of left hip, not elsewhere classified  Stiffness of right hip, not elsewhere classified     Problem List Patient Active Problem List   Diagnosis Date Noted  . Family history of premature CAD 03/20/2015  . Obstructive sleep apnea 12/06/2013  . Sinus tachycardia ([place]) 10/27/2013  . Ejection fraction   . Plantar fasciitis of right foot 10/21/2012  . Type 2 diabetes mellitus, controlled ([place]) 04/14/2012  . Anxiety 02/03/2011  . Overweight 10/23/2008  . PERIPHERAL NEUROPATHY 12/06/2006  . Insomnia 12/06/2006  . TRANSAMINASES, SERUM, ELEVATED 11/30/2005  . Hyperlipidemia 11/29/2005  Sigurd Sos, PT 05/01/2015 8:41 AM  [facility] [address], [place], [place], [address] Phone: [phone]   Fax:  [phone]  Name: MACINTYRE LUEVANOS MRN: [id number] Date of Birth: July 08, 1955

## 2015-05-07 ENCOUNTER — Ambulatory Visit: Payer: BLUE CROSS/BLUE SHIELD

## 2015-05-07 DIAGNOSIS — M25651 Stiffness of right hip, not elsewhere classified: Secondary | ICD-10-CM

## 2015-05-07 DIAGNOSIS — M545 Low back pain, unspecified: Secondary | ICD-10-CM

## 2015-05-07 DIAGNOSIS — M25652 Stiffness of left hip, not elsewhere classified: Secondary | ICD-10-CM

## 2015-05-07 NOTE — Therapy (Signed)
[facility] [address], [place], [place], [address] Phone: [phone]   Fax:  [phone]  Physical Therapy Treatment  Patient Details  Name: Brandon Frank MRN: [id number] Date of Birth: 1955-11-12 Referring Provider: Garret Reddish, MD  Encounter Date: 05/07/2015      PT End of Session - 05/07/15 0839    Visit Number 4   Date for PT Re-Evaluation 06/18/15   PT Start Time 0800   PT Stop Time 0841   PT Time Calculation (min) 41 min   Activity Tolerance Patient tolerated treatment well   Behavior During Therapy [facility] for tasks assessed/performed      Past Medical History  Diagnosis Date  . Hyperlipidemia   . Chronic low back pain   . Ejection fraction   . Obstructive sleep apnea 12/06/2013    NPSG 11/14/13- Severe OSA, AHI 86/ hr with loud snore, desat ot 80%, titrated to CPAP 12, weight 215 lbs   . Sinus tachycardia ([place]) 10/27/2013    Persistent mild sinus tachycardia, October, 2015   //   48 hour Holter, October, 2015, rare PACs and PVCs. Heart rate range 70-128, mean heart rate 95 during the daytime, mean heart rate 85 during the resting hours.   //   Patient was diagnosed with severe sleep apnea. C Pap was started and his resting sinus tachycardia improved greatly.   . Family history of premature CAD 03/20/2015    Past Surgical History  Procedure Laterality Date  . Hernia repair    . Rotator cuff surgery  2012    Dr. [name]  . Prostate biopsy N/A     There were no vitals filed for this visit.      Subjective Assessment - 05/07/15 0806    Subjective Pt was able to walk 1-1.5 hours without limitation.  Pt plans to try to play soccer tonight.     Pertinent History coccyx fracture   Patient Stated Goals learn core exercises to prevent flare-up of pain, reduce LBP   Currently in Pain? Yes   Pain Score 3    Pain Location Back   Pain Orientation Right;Left;Lower   Pain Descriptors / Indicators  Aching;Dull   Pain Type Acute pain   Pain Onset More than a month ago   Pain Frequency Constant   Aggravating Factors  moving boxes at home, yardwork, activity   Pain Relieving Factors Advil, change of position                         [facility] Adult PT Treatment/Exercise - 05/07/15 0001    Exercises   Exercises Shoulder   Lumbar Exercises: Stretches   Single Knee to Chest Stretch 3 reps;20 seconds   Lower Trunk Rotation 3 reps;20 seconds   Piriformis Stretch 3 reps;20 seconds   Lumbar Exercises: Standing   Other Standing Lumbar Exercises walking in reverse with weight 25# with abdominal bracing 2x10   Lumbar Exercises: Supine   Other Supine Lumbar Exercises supine on foam roll: x 3 minutes   Knee/Hip Exercises: Aerobic   Nustep Level 3 x 8 minutes  seat 9, arms 10   Shoulder Exercises: Supine   Horizontal ABduction Strengthening;Both;20 reps;Theraband   Theraband Level (Shoulder Horizontal ABduction) Level 2 (Red)   Horizontal ABduction Limitations supine on foam roll   Other Supine Exercises supine of foam roll with red band D2 2x10  PT Short Term Goals - 05/07/15 0807    PT SHORT TERM GOAL #2   Title report 50% less lumbar pain with gardening tasks   Time 4   Period Weeks   Status On-going   PT SHORT TERM GOAL #3   Title demonnstrate and verbalize correct body mechanics for home and work tasks   Status Achieved           PT Long Term Goals - 05/07/15 0808    PT LONG TERM GOAL #3   Title perform gardening without pain in the low back   Time 8   Period Weeks   Status On-going   PT LONG TERM GOAL #4   Title report a 75% reduction in the frequency and intensity of LBP with home and work tasks   Time 8   Period Weeks   Status On-going               Plan - 05/07/15 0808    Clinical Impression Statement Pt has been able to perform yardwork and walk for up to 1.5 hours without limitation.  Pt reports that he has pain  after these activities up to 3/10.  Pt has been doing HEP for core strength and flexiblity.  Pt will benefit from skilled PT for HEP advancement for core strength.     Rehab Potential Good   PT Frequency 2x / week   PT Duration 8 weeks   PT Treatment/Interventions ADLs/Self Care Home Management;Cryotherapy;Electrical Stimulation;Moist Heat;Therapeutic exercise;Therapeutic activities;Functional mobility training;Ultrasound;Neuromuscular re-education;Patient/family education;Manual techniques;Dry needling;Passive range of motion   PT Next Visit Plan continue flexibility and core strength   Consulted and Agree with Plan of Care Patient      Patient will benefit from skilled therapeutic intervention in order to improve the following deficits and impairments:  Postural dysfunction, Impaired flexibility, Improper body mechanics, Pain, Decreased activity tolerance, Decreased range of motion  Visit Diagnosis: Bilateral low back pain without sciatica  Stiffness of left hip, not elsewhere classified  Stiffness of right hip, not elsewhere classified     Problem List Patient Active Problem List   Diagnosis Date Noted  . Family history of premature CAD 03/20/2015  . Obstructive sleep apnea 12/06/2013  . Sinus tachycardia ([place]) 10/27/2013  . Ejection fraction   . Plantar fasciitis of right foot 10/21/2012  . Type 2 diabetes mellitus, controlled ([place]) 04/14/2012  . Anxiety 02/03/2011  . Overweight 10/23/2008  . PERIPHERAL NEUROPATHY 12/06/2006  . Insomnia 12/06/2006  . TRANSAMINASES, SERUM, ELEVATED 11/30/2005  . Hyperlipidemia 11/29/2005    Sigurd Sos, PT 05/07/2015 8:40 AM  [facility] [address] [place], [place], [address] Phone: [phone]   Fax:  [phone]  Name: Brandon Frank MRN: [id number] Date of Birth: 01/14/1956

## 2015-05-10 ENCOUNTER — Encounter: Payer: Self-pay | Admitting: Physical Therapy

## 2015-05-10 ENCOUNTER — Ambulatory Visit: Payer: BLUE CROSS/BLUE SHIELD | Admitting: Physical Therapy

## 2015-05-10 DIAGNOSIS — M545 Low back pain, unspecified: Secondary | ICD-10-CM

## 2015-05-10 DIAGNOSIS — M25652 Stiffness of left hip, not elsewhere classified: Secondary | ICD-10-CM

## 2015-05-10 DIAGNOSIS — M25651 Stiffness of right hip, not elsewhere classified: Secondary | ICD-10-CM

## 2015-05-10 NOTE — Therapy (Signed)
[facility] [address], [place], [place], [address] Phone: [phone]   Fax:  [phone]  Physical Therapy Treatment  Patient Details  Name: DEYVI BONANNO MRN: [id number] Date of Birth: [birth year] Referring Provider: Garret Reddish, MD  Encounter Date: 05/10/2015      PT End of Session - 05/10/15 0848    Visit Number 5   Date for PT Re-Evaluation 06/18/15   PT Start Time 0846   PT Stop Time 0925   PT Time Calculation (min) 39 min   Activity Tolerance Patient tolerated treatment well   Behavior During Therapy [facility] for tasks assessed/performed      Past Medical History  Diagnosis Date  . Hyperlipidemia   . Chronic low back pain   . Ejection fraction   . Obstructive sleep apnea 12/06/2013    NPSG 11/14/13- Severe OSA, AHI 86/ hr with loud snore, desat ot 80%, titrated to CPAP 12, weight 215 lbs   . Sinus tachycardia ([place]) 10/27/2013    Persistent mild sinus tachycardia, October, 2015   //   48 hour Holter, October, 2015, rare PACs and PVCs. Heart rate range 70-128, mean heart rate 95 during the daytime, mean heart rate 85 during the resting hours.   //   Patient was diagnosed with severe sleep apnea. C Pap was started and his resting sinus tachycardia improved greatly.   . Family history of premature CAD 03/20/2015    Past Surgical History  Procedure Laterality Date  . Hernia repair    . Rotator cuff surgery  2012    Dr. [name]  . Prostate biopsy N/A     There were no vitals filed for this visit.      Subjective Assessment - 05/10/15 0849    Subjective Able to play soccer the othe day and feel good doing so. Had floors done at home so he moved a lot of furniture this week which makes the back sore at night. Yardwaork about the same pain.    Currently in Pain? Yes   Pain Score 2    Pain Location Back   Pain Orientation Right;Left;Lower   Multiple Pain Sites No                          [facility] Adult PT Treatment/Exercise - 05/10/15 0001    Lumbar Exercises: Standing   Other Standing Lumbar Exercises walking in reverse with weight 30# with abdominal bracing 2x10   Lumbar Exercises: Supine   Ab Set --  On foam roll 10x   Bent Knee Raise --  On foam roll 10x   Other Supine Lumbar Exercises supine on foam roll: x 3 minutes   Other Supine Lumbar Exercises green band scap unattached series 15x each  dying bug slow on foam roll 5x bil   Knee/Hip Exercises: Stretches   Active Hamstring Stretch Both;3 reps;20 seconds   Knee/Hip Exercises: Aerobic   Nustep L3 x 10 min                  PT Short Term Goals - 05/10/15 0849    PT SHORT TERM GOAL #2   Title report 50% less lumbar pain with gardening tasks   Time 4   Period Weeks   Status --  About the same           PT Long Term Goals - 05/07/15 0350    PT LONG TERM GOAL #3  Title perform gardening without pain in the low back   Time 8   Period Weeks   Status On-going   PT LONG TERM GOAL #4   Title report a 75% reduction in the frequency and intensity of LBP with home and work tasks   Time 8   Period Weeks   Status On-going               Plan - 05/10/15 0901    Clinical Impression Statement Pain with yardwork pt reports today is about the same. This week has been particularly challenging due to moving a lot of furniture this week. Because of this his back has been more sore at night, but he knows why. He was able to play soccer this week wihtout pain . No goals met this week due to having extra challlenges. He does report feeling stronger in his core.    Rehab Potential Good   PT Frequency 2x / week   PT Duration 8 weeks   PT Treatment/Interventions ADLs/Self Care Home Management;Cryotherapy;Electrical Stimulation;Moist Heat;Therapeutic exercise;Therapeutic activities;Functional mobility training;Ultrasound;Neuromuscular re-education;Patient/family education;Manual techniques;Dry  needling;Passive range of motion   PT Next Visit Plan continue flexibility and core strength   Consulted and Agree with Plan of Care Patient      Patient will benefit from skilled therapeutic intervention in order to improve the following deficits and impairments:  Postural dysfunction, Impaired flexibility, Improper body mechanics, Pain, Decreased activity tolerance, Decreased range of motion  Visit Diagnosis: Bilateral low back pain without sciatica  Stiffness of left hip, not elsewhere classified  Stiffness of right hip, not elsewhere classified     Problem List Patient Active Problem List   Diagnosis Date Noted  . Family history of premature CAD 03/20/2015  . Obstructive sleep apnea 12/06/2013  . Sinus tachycardia ([place]) 10/27/2013  . Ejection fraction   . Plantar fasciitis of right foot 10/21/2012  . Type 2 diabetes mellitus, controlled ([place]) 04/14/2012  . Anxiety 02/03/2011  . Overweight 10/23/2008  . PERIPHERAL NEUROPATHY 12/06/2006  . Insomnia 12/06/2006  . TRANSAMINASES, SERUM, ELEVATED 11/30/2005  . Hyperlipidemia 11/29/2005    Annayah Worthley, PTA 05/10/2015, 9:15 AM  [facility] [address] [place], [place], [address] Phone: [phone]   Fax:  [phone]  Name: EMARION TORAL MRN: [id number] Date of Birth: 03/26/1955

## 2015-05-14 ENCOUNTER — Ambulatory Visit: Payer: BLUE CROSS/BLUE SHIELD | Admitting: Physical Therapy

## 2015-05-14 ENCOUNTER — Encounter: Payer: Self-pay | Admitting: Physical Therapy

## 2015-05-14 DIAGNOSIS — M545 Low back pain, unspecified: Secondary | ICD-10-CM

## 2015-05-14 DIAGNOSIS — M25651 Stiffness of right hip, not elsewhere classified: Secondary | ICD-10-CM

## 2015-05-14 DIAGNOSIS — M25652 Stiffness of left hip, not elsewhere classified: Secondary | ICD-10-CM

## 2015-05-14 NOTE — Therapy (Signed)
[facility] [address] [place], [place], [address] Phone: [phone]   Fax:  [phone]  Physical Therapy Treatment  Patient Details  Name: Brandon Frank MRN: [id number] Date of Birth: [birth year] Referring Provider: Garret Reddish, MD  Encounter Date: 05/14/2015      PT End of Session - 05/14/15 0808    Visit Number 6   Date for PT Re-Evaluation 06/18/15   PT Start Time 0800   PT Stop Time 0845   PT Time Calculation (min) 45 min   Activity Tolerance Patient tolerated treatment well   Behavior During Therapy [facility] for tasks assessed/performed      Past Medical History  Diagnosis Date  . Hyperlipidemia   . Chronic low back pain   . Ejection fraction   . Obstructive sleep apnea 12/06/2013    NPSG 11/14/13- Severe OSA, AHI 86/ hr with loud snore, desat ot 80%, titrated to CPAP 12, weight 215 lbs   . Sinus tachycardia ([place]) 10/27/2013    Persistent mild sinus tachycardia, October, 2015   //   48 hour Holter, October, 2015, rare PACs and PVCs. Heart rate range 70-128, mean heart rate 95 during the daytime, mean heart rate 85 during the resting hours.   //   Patient was diagnosed with severe sleep apnea. C Pap was started and his resting sinus tachycardia improved greatly.   . Family history of premature CAD 03/20/2015    Past Surgical History  Procedure Laterality Date  . Hernia repair    . Rotator cuff surgery  2012    Dr. [name]  . Prostate biopsy N/A     There were no vitals filed for this visit.      Subjective Assessment - 05/14/15 0805    Subjective Pt rates his low back pain as 2/10 this morning, but during the day it goes up to 5/10. he is painting the floor boards at home. However, all over feeling improvement fwith his back   Pertinent History coccyx fracture   Patient Stated Goals learn core exercises to prevent flare-up of pain, reduce LBP   Currently in Pain? Yes   Pain Score 2    Pain  Location Back   Pain Orientation Right;Left;Lower   Pain Descriptors / Indicators Aching;Dull   Pain Type Acute pain   Pain Onset More than a month ago   Pain Frequency Constant   Aggravating Factors  moving boxes at home, yardwork, activity   Pain Relieving Factors Advil, changing positions   Multiple Pain Sites No                         [facility] Adult PT Treatment/Exercise - 05/14/15 0001    Exercises   Exercises Shoulder   Lumbar Exercises: Standing   Other Standing Lumbar Exercises walking in reverse with weight 30# with abdominal bracing 2x10  2 x 10   Lumbar Exercises: Supine   Ab Set 10 reps  on foam unilateral UE, LE, & diagonal UE/LE   Other Supine Lumbar Exercises supine on foam roll: x 3 minutes  FR green t-band abduction, D2 with  focus on TA activation   Other Supine Lumbar Exercises green physioball hip/knee flexion into bridging   Knee/Hip Exercises: Aerobic   Stationary Bike L2 x 10 min                  PT Short Term Goals - 05/14/15 [id number]    PT  SHORT TERM GOAL #1   Title be independent in initial HEP   Time 8   Period Weeks   Status Achieved   PT SHORT TERM GOAL #2   Title report 50% less lumbar pain with gardening tasks  20%    Time 4   Period Weeks   Status On-going   PT SHORT TERM GOAL #3   Title demonnstrate and verbalize correct body mechanics for home and work tasks   Time 8   Period Weeks   Status Achieved           PT Long Term Goals - 05/14/15 [id number]    PT LONG TERM GOAL #1   Title be independent in advanced HEP   Time 8   Period Weeks   Status On-going   PT LONG TERM GOAL #2   Title reduce FOTO to < or = to 31% limitation   Time 8   Period Weeks   Status On-going   PT LONG TERM GOAL #3   Title perform gardening without pain in the low back   Time 8   Period Weeks   Status On-going   PT LONG TERM GOAL #4   Title report a 75% reduction in the frequency and intensity of LBP with home and work tasks    Time 8   Period Weeks   Status On-going               Plan - 05/14/15 0809    Clinical Impression Statement Pt is painting his floorboards and able to perform with changing positions. Pt performs well with his exercises, but presents with weak core muscles. Pt will continue to benefit from skilled PT to progress toward goals   Rehab Potential Good   PT Frequency 2x / week   PT Duration 8 weeks   PT Treatment/Interventions ADLs/Self Care Home Management;Cryotherapy;Electrical Stimulation;Moist Heat;Therapeutic exercise;Therapeutic activities;Functional mobility training;Ultrasound;Neuromuscular re-education;Patient/family education;Manual techniques;Dry needling;Passive range of motion   PT Next Visit Plan continue flexibility and core strength   Consulted and Agree with Plan of Care Patient      Patient will benefit from skilled therapeutic intervention in order to improve the following deficits and impairments:  Postural dysfunction, Impaired flexibility, Improper body mechanics, Pain, Decreased activity tolerance, Decreased range of motion  Visit Diagnosis: Bilateral low back pain without sciatica  Stiffness of left hip, not elsewhere classified  Stiffness of right hip, not elsewhere classified     Problem List Patient Active Problem List   Diagnosis Date Noted  . Family history of premature CAD 03/20/2015  . Obstructive sleep apnea 12/06/2013  . Sinus tachycardia ([place]) 10/27/2013  . Ejection fraction   . Plantar fasciitis of right foot 10/21/2012  . Type 2 diabetes mellitus, controlled ([place]) 04/14/2012  . Anxiety 02/03/2011  . Overweight 10/23/2008  . PERIPHERAL NEUROPATHY 12/06/2006  . Insomnia 12/06/2006  . TRANSAMINASES, SERUM, ELEVATED 11/30/2005  . Hyperlipidemia 11/29/2005    NAUMANN-HOUEGNIFIO,Kaitlin Alcindor PTA 05/14/2015, 8:48 AM  [facility] [address] [place], [place], [address] Phone:  [phone]   Fax:  [phone]  Name: Brandon Frank MRN: [id number] Date of Birth: 1955-10-16

## 2015-05-15 ENCOUNTER — Other Ambulatory Visit: Payer: Self-pay | Admitting: Family Medicine

## 2015-05-17 ENCOUNTER — Encounter: Payer: Self-pay | Admitting: Physical Therapy

## 2015-05-17 ENCOUNTER — Ambulatory Visit: Payer: BLUE CROSS/BLUE SHIELD | Admitting: Physical Therapy

## 2015-05-17 DIAGNOSIS — M545 Low back pain, unspecified: Secondary | ICD-10-CM

## 2015-05-17 DIAGNOSIS — M25651 Stiffness of right hip, not elsewhere classified: Secondary | ICD-10-CM

## 2015-05-17 DIAGNOSIS — M25652 Stiffness of left hip, not elsewhere classified: Secondary | ICD-10-CM

## 2015-05-17 NOTE — Therapy (Signed)
[facility] [address] [place], [place], [address] Phone: [phone]   Fax:  [phone]  Physical Therapy Treatment  Patient Details  Name: NICHOLES HUML MRN: [id number] Date of Birth: 05/04/1955 Referring Provider: Garret Reddish, MD  Encounter Date: 05/17/2015      PT End of Session - 05/17/15 0850    Visit Number 7   Date for PT Re-Evaluation 06/18/15   PT Start Time 0846   PT Stop Time 0930   PT Time Calculation (min) 44 min   Activity Tolerance Patient tolerated treatment well   Behavior During Therapy [facility] for tasks assessed/performed      Past Medical History  Diagnosis Date  . Hyperlipidemia   . Chronic low back pain   . Ejection fraction   . Obstructive sleep apnea 12/06/2013    NPSG 11/14/13- Severe OSA, AHI 86/ hr with loud snore, desat ot 80%, titrated to CPAP 12, weight 215 lbs   . Sinus tachycardia ([place]) 10/27/2013    Persistent mild sinus tachycardia, October, 2015   //   48 hour Holter, October, 2015, rare PACs and PVCs. Heart rate range 70-128, mean heart rate 95 during the daytime, mean heart rate 85 during the resting hours.   //   Patient was diagnosed with severe sleep apnea. C Pap was started and his resting sinus tachycardia improved greatly.   . Family history of premature CAD 03/20/2015    Past Surgical History  Procedure Laterality Date  . Hernia repair    . Rotator cuff surgery  2012    Dr. [name]  . Prostate biopsy N/A     There were no vitals filed for this visit.      Subjective Assessment - 05/17/15 0848    Subjective Been using friends inversion table. Only used 2x so far. Got foam roll and is using at home.    Currently in Pain? Yes   Pain Score 2    Pain Location Back   Pain Orientation Right;Left;Lower   Pain Descriptors / Indicators Dull   Multiple Pain Sites No                         [facility] Adult PT Treatment/Exercise - 05/17/15 0001     Lumbar Exercises: Supine   Bent Knee Raise --  Marching series on soft foam roll 10x each   Other Supine Lumbar Exercises supine on foam roll: x 3 minutes  FR green t-band abduction, D2 with  focus on TA activation   Other Supine Lumbar Exercises [place]ne LE/LB release series    Knee/Hip Exercises: Aerobic   Nustep L3 x 10 min                PT Education - 05/17/15 0926    Education provided Yes   Education Details Foam roll routine for home   Person(s) Educated Patient   Methods Explanation;Demonstration;Tactile cues;Verbal cues;Handout   Comprehension Verbalized understanding;Returned demonstration          PT Short Term Goals - 05/14/15 0817    PT SHORT TERM GOAL #1   Title be independent in initial HEP   Time 8   Period Weeks   Status Achieved   PT SHORT TERM GOAL #2   Title report 50% less lumbar pain with gardening tasks  20%    Time 4   Period Weeks   Status On-going   PT SHORT TERM GOAL #3  Title demonnstrate and verbalize correct body mechanics for home and work tasks   Time 8   Period Weeks   Status Achieved           PT Long Term Goals - 05/14/15 0819    PT LONG TERM GOAL #1   Title be independent in advanced HEP   Time 8   Period Weeks   Status On-going   PT LONG TERM GOAL #2   Title reduce FOTO to < or = to 31% limitation   Time 8   Period Weeks   Status On-going   PT LONG TERM GOAL #3   Title perform gardening without pain in the low back   Time 8   Period Weeks   Status On-going   PT LONG TERM GOAL #4   Title report a 75% reduction in the frequency and intensity of LBP with home and work tasks   Time 8   Period Weeks   Status On-going               Plan - 05/17/15 0851    Clinical Impression Statement Pt purchased foam roll for home. Went through [employer] core stabilization program and lumbar/LE release program he can do at home with his roll. He continues to have more symptoms/pain doing all his home renovations. This will  most likely slow his progress. Pt has such tight hip flexors he needs to use pilows and towel for props.    Rehab Potential Good   PT Frequency 2x / week   PT Duration 8 weeks   PT Treatment/Interventions ADLs/Self Care Home Management;Cryotherapy;Electrical Stimulation;Moist Heat;Therapeutic exercise;Therapeutic activities;Functional mobility training;Ultrasound;Neuromuscular re-education;Patient/family education;Manual techniques;Dry needling;Passive range of motion   PT Next Visit Plan continue flexibility and core strength   Consulted and Agree with Plan of Care Patient      Patient will benefit from skilled therapeutic intervention in order to improve the following deficits and impairments:  Postural dysfunction, Impaired flexibility, Improper body mechanics, Pain, Decreased activity tolerance, Decreased range of motion  Visit Diagnosis: Bilateral low back pain without sciatica  Stiffness of left hip, not elsewhere classified  Stiffness of right hip, not elsewhere classified     Problem List Patient Active Problem List   Diagnosis Date Noted  . Family history of premature CAD 03/20/2015  . Obstructive sleep apnea 12/06/2013  . Sinus tachycardia ([place]) 10/27/2013  . Ejection fraction   . Plantar fasciitis of right foot 10/21/2012  . Type 2 diabetes mellitus, controlled ([place]) 04/14/2012  . Anxiety 02/03/2011  . Overweight 10/23/2008  . PERIPHERAL NEUROPATHY 12/06/2006  . Insomnia 12/06/2006  . TRANSAMINASES, SERUM, ELEVATED 11/30/2005  . Hyperlipidemia 11/29/2005    Khadeja Abt, PTA 05/17/2015, 9:27 AM  [facility] [address], [place], [place], [address] Phone: [phone]   Fax:  [phone]  Name: OTHIE PICKAR MRN: [id number] Date of Birth: Feb 21, 1955

## 2015-05-17 NOTE — Patient Instructions (Signed)
Foam roll core exercises:  -Lay for 1 min to relax the spine - Mini marching 10x each side - Then take arm to opposite shoulder and march the opposite  Leg 10x, then switch arms & legs.  - Lift one leg to 90 degrees & hold, then bring the opposite knee up & hold. Then one leg down at a time. Do 5-8x. - End with rocking side to side    Foam roll release   - Put the roll under your tailbone - KNees are bent up heels to your butt.  - Side to side 10x, then circles 10x each direction, then stretch one leg up at a time circling at the ankle in both direction. Do the other leg.  - KNee to chest with opposite foot down on the mat, activley pressing. Hold for 3 breaths. Do each side 1-3x. - Then extend the bent knee all the way straight using pillows/towels to support the leg.

## 2015-05-21 ENCOUNTER — Ambulatory Visit: Payer: BLUE CROSS/BLUE SHIELD | Attending: Family Medicine

## 2015-05-21 DIAGNOSIS — M25652 Stiffness of left hip, not elsewhere classified: Secondary | ICD-10-CM | POA: Diagnosis present

## 2015-05-21 DIAGNOSIS — M25651 Stiffness of right hip, not elsewhere classified: Secondary | ICD-10-CM | POA: Diagnosis present

## 2015-05-21 DIAGNOSIS — M545 Low back pain, unspecified: Secondary | ICD-10-CM

## 2015-05-21 NOTE — Therapy (Signed)
[facility] [address] [place], [place], [address] Phone: [phone]   Fax:  [phone]  Physical Therapy Treatment  Patient Details  Name: Brandon Frank MRN: [id number] Date of Birth: 1955/05/31 Referring Provider: Garret Reddish, MD  Encounter Date: 05/21/2015      PT End of Session - 05/21/15 0839    Visit Number 8   Date for PT Re-Evaluation 06/18/15   PT Start Time 0800   PT Stop Time 0841   PT Time Calculation (min) 41 min   Activity Tolerance Patient tolerated treatment well   Behavior During Therapy [facility] for tasks assessed/performed      Past Medical History  Diagnosis Date  . Hyperlipidemia   . Chronic low back pain   . Ejection fraction   . Obstructive sleep apnea 12/06/2013    NPSG 11/14/13- Severe OSA, AHI 86/ hr with loud snore, desat ot 80%, titrated to CPAP 12, weight 215 lbs   . Sinus tachycardia ([place]) 10/27/2013    Persistent mild sinus tachycardia, October, 2015   //   48 hour Holter, October, 2015, rare PACs and PVCs. Heart rate range 70-128, mean heart rate 95 during the daytime, mean heart rate 85 during the resting hours.   //   Patient was diagnosed with severe sleep apnea. C Pap was started and his resting sinus tachycardia improved greatly.   . Family history of premature CAD 03/20/2015    Past Surgical History  Procedure Laterality Date  . Hernia repair    . Rotator cuff surgery  2012    Dr. [name]  . Prostate biopsy N/A     There were no vitals filed for this visit.      Subjective Assessment - 05/21/15 0819    Subjective Feeling better each day.  Sore in the morning.   Patient Stated Goals learn core exercises to prevent flare-up of pain, reduce LBP   Currently in Pain? Yes   Pain Score 2    Pain Location Back   Pain Orientation Right;Left;Lower   Pain Descriptors / Indicators Dull   Pain Type Acute pain   Pain Onset More than a month ago   Pain Frequency Constant    Aggravating Factors  activity, yardwork, activity   Pain Relieving Factors Advil, change of position                         [facility] Adult PT Treatment/Exercise - 05/21/15 0001    Lumbar Exercises: Standing   Other Standing Lumbar Exercises walking in reverse with weight 30# with abdominal bracing 2x10  2 x 10   Lumbar Exercises: Supine   Bridge 10 reps;5 seconds   Other Supine Lumbar Exercises supine on foam roll: x 3 minutes  FR green t-band abduction, D2 2xwith  focus on TA activation   Knee/Hip Exercises: Stretches   Active Hamstring Stretch Both;3 reps;20 seconds   Hip Flexor Stretch Both;3 reps;20 seconds   Knee/Hip Exercises: Aerobic   Nustep L3 x 10 min  seat 9, arms 9                  PT Short Term Goals - 05/21/15 0820    PT SHORT TERM GOAL #2   Title report 50% less lumbar pain with gardening tasks   Time 4   Period Weeks   Status On-going   PT SHORT TERM GOAL #3   Title demonnstrate and verbalize correct body mechanics  for home and work tasks   Status Achieved           PT [place] - 05/21/15 0821    PT LONG TERM GOAL #1   Title be independent in advanced HEP   Time 8   Period Weeks   Status On-going   PT LONG TERM GOAL #3   Title perform gardening without pain in the low back   Time 8   Period Weeks   Status On-going   PT LONG TERM GOAL #4   Title report a 75% reduction in the frequency and intensity of LBP with home and work tasks   Time 8   Period Weeks   Status On-going               Plan - 05/21/15 0822    Clinical Impression Statement Pt was able to play soccer last week without pain.  Pt is independent in stabilization program and relase with foam roll.  Pt continues to have LBP with housework and endurance tasks.  Pt with tight hips and weak core and will continue to benefit from skilled PT for advancement of exercises.     Rehab Potential Good   PT Frequency 2x / week   PT Duration 8 weeks   PT  Treatment/Interventions ADLs/Self Care Home Management;Cryotherapy;Electrical Stimulation;Moist Heat;Therapeutic exercise;Therapeutic activities;Functional mobility training;Ultrasound;Neuromuscular re-education;Patient/family education;Manual techniques;Dry needling;Passive range of motion   PT Next Visit Plan continue flexibility and core strength   Consulted and Agree with Plan of Care Patient      Patient will benefit from skilled therapeutic intervention in order to improve the following deficits and impairments:  Postural dysfunction, Impaired flexibility, Improper body mechanics, Pain, Decreased activity tolerance, Decreased range of motion  Visit Diagnosis: Bilateral low back pain without sciatica  Stiffness of left hip, not elsewhere classified  Stiffness of right hip, not elsewhere classified     Problem List Patient Active Problem List   Diagnosis Date Noted  . Family history of premature CAD 03/20/2015  . Obstructive sleep apnea 12/06/2013  . Sinus tachycardia ([place]) 10/27/2013  . Ejection fraction   . Plantar fasciitis of right foot 10/21/2012  . Type 2 diabetes mellitus, controlled ([place]) 04/14/2012  . Anxiety 02/03/2011  . Overweight 10/23/2008  . PERIPHERAL NEUROPATHY 12/06/2006  . Insomnia 12/06/2006  . TRANSAMINASES, SERUM, ELEVATED 11/30/2005  . Hyperlipidemia 11/29/2005     Sigurd Sos, PT 05/21/2015 8:41 AM   [facility] [address], [place], [place], [address] Phone: [phone]   Fax:  [phone]  Name: Brandon Frank MRN: [id number] Date of Birth: 14-Sep-1955

## 2015-05-24 ENCOUNTER — Encounter: Payer: Self-pay | Admitting: Physical Therapy

## 2015-05-24 ENCOUNTER — Ambulatory Visit: Payer: BLUE CROSS/BLUE SHIELD | Admitting: Physical Therapy

## 2015-05-24 DIAGNOSIS — M545 Low back pain, unspecified: Secondary | ICD-10-CM

## 2015-05-24 DIAGNOSIS — M25651 Stiffness of right hip, not elsewhere classified: Secondary | ICD-10-CM

## 2015-05-24 DIAGNOSIS — M25652 Stiffness of left hip, not elsewhere classified: Secondary | ICD-10-CM

## 2015-05-24 NOTE — Therapy (Signed)
[facility] [address] [place], [place], [address] Phone: [phone]   Fax:  [phone]  Physical Therapy Treatment  Patient Details  Name: Brandon Frank MRN: [id number] Date of Birth: May 31, 1955 Referring Provider: Garret Reddish, MD  Encounter Date: 05/24/2015      PT End of Session - 05/24/15 0807    Visit Number 9   Date for PT Re-Evaluation 06/18/15   PT Start Time 0801   PT Stop Time 0845   PT Time Calculation (min) 44 min   Activity Tolerance Patient tolerated treatment well   Behavior During Therapy [facility] for tasks assessed/performed      Past Medical History  Diagnosis Date  . Hyperlipidemia   . Chronic low back pain   . Ejection fraction   . Obstructive sleep apnea 12/06/2013    NPSG 11/14/13- Severe OSA, AHI 86/ hr with loud snore, desat ot 80%, titrated to CPAP 12, weight 215 lbs   . Sinus tachycardia ([place]) 10/27/2013    Persistent mild sinus tachycardia, October, 2015   //   48 hour Holter, October, 2015, rare PACs and PVCs. Heart rate range 70-128, mean heart rate 95 during the daytime, mean heart rate 85 during the resting hours.   //   Patient was diagnosed with severe sleep apnea. C Pap was started and his resting sinus tachycardia improved greatly.   . Family history of premature CAD 03/20/2015    Past Surgical History  Procedure Laterality Date  . Hernia repair    . Rotator cuff surgery  2012    Dr. [name]  . Prostate biopsy N/A     There were no vitals filed for this visit.      Subjective Assessment - 05/24/15 0803    Subjective About hte same. Likes his new exercises & stretches.   Currently in Pain? Yes   Pain Score 2    Pain Location Back   Pain Orientation Right;Left;Lower   Pain Descriptors / Indicators Dull   Multiple Pain Sites No                         [facility] Adult PT Treatment/Exercise - 05/24/15 0001    Lumbar Exercises: Supine   Ab Set --   Foam roll stabilization series   Other Supine Lumbar Exercises supine on foam roll: x 3 minutes  FR green t-band abduction, D2 2xwith  focus on TA activation   Other Supine Lumbar Exercises Suipne LE/LB release series    Knee/Hip Exercises: Stretches   Active Hamstring Stretch Both;3 reps;20 seconds   Quad Stretch Both;2 reps;20 seconds   Hip Flexor Stretch Both;3 reps;20 seconds   Other Knee/Hip Stretches Quad rolling/release work in prone with foam roll   Knee/Hip Exercises: Aerobic   Nustep L3 x 10 min                  PT Short Term Goals - 05/21/15 0820    PT SHORT TERM GOAL #2   Title report 50% less lumbar pain with gardening tasks   Time 4   Period Weeks   Status On-going   PT SHORT TERM GOAL #3   Title demonnstrate and verbalize correct body mechanics for home and work tasks   Status Achieved           PT Long Term Goals - 05/21/15 [id number]    PT LONG TERM GOAL #1   Title be independent in advanced  HEP   Time 8   Period Weeks   Status On-going   PT LONG TERM GOAL #3   Title perform gardening without pain in the low back   Time 8   Period Weeks   Status On-going   PT LONG TERM GOAL #4   Title report a 75% reduction in the frequency and intensity of LBP with home and work tasks   Time 8   Period Weeks   Status On-going               Plan - 05/24/15 0818    Clinical Impression Statement Overall his pain is more of an ache versus a sharp pain. He would like to see the constant ache be less constant. He is able to do all his exercises wihtout any exacerbation of pain.    Rehab Potential Good   PT Frequency 2x / week   PT Duration 8 weeks   PT Treatment/Interventions ADLs/Self Care Home Management;Cryotherapy;Electrical Stimulation;Moist Heat;Therapeutic exercise;Therapeutic activities;Functional mobility training;Ultrasound;Neuromuscular re-education;Patient/family education;Manual techniques;Dry needling;Passive range of motion   PT Next Visit  Plan continue flexibility and core strength   Consulted and Agree with Plan of Care Patient      Patient will benefit from skilled therapeutic intervention in order to improve the following deficits and impairments:  Postural dysfunction, Impaired flexibility, Improper body mechanics, Pain, Decreased activity tolerance, Decreased range of motion  Visit Diagnosis: Bilateral low back pain without sciatica  Stiffness of left hip, not elsewhere classified  Stiffness of right hip, not elsewhere classified     Problem List Patient Active Problem List   Diagnosis Date Noted  . Family history of premature CAD 03/20/2015  . Obstructive sleep apnea 12/06/2013  . Sinus tachycardia ([place]) 10/27/2013  . Ejection fraction   . Plantar fasciitis of right foot 10/21/2012  . Type 2 diabetes mellitus, controlled ([place]) 04/14/2012  . Anxiety 02/03/2011  . Overweight 10/23/2008  . PERIPHERAL NEUROPATHY 12/06/2006  . Insomnia 12/06/2006  . TRANSAMINASES, SERUM, ELEVATED 11/30/2005  . Hyperlipidemia 11/29/2005    Traniece Boffa, PTA 05/24/2015, 8:41 AM  [facility] [address] [place], [place], [address] Phone: [phone]   Fax:  [phone]  Name: Brandon Frank MRN: [id number] Date of Birth: 07-Apr-1955

## 2015-05-27 ENCOUNTER — Encounter: Payer: Self-pay | Admitting: Internal Medicine

## 2015-05-27 ENCOUNTER — Ambulatory Visit (INDEPENDENT_AMBULATORY_CARE_PROVIDER_SITE_OTHER): Payer: BLUE CROSS/BLUE SHIELD | Admitting: Internal Medicine

## 2015-05-27 VITALS — BP 122/66 | HR 100 | Ht 70.0 in | Wt 217.0 lb

## 2015-05-27 DIAGNOSIS — G4733 Obstructive sleep apnea (adult) (pediatric): Secondary | ICD-10-CM

## 2015-05-27 DIAGNOSIS — G4700 Insomnia, unspecified: Secondary | ICD-10-CM

## 2015-05-27 MED ORDER — ZALEPLON 5 MG PO CAPS: 5.0000 mg | ORAL_CAPSULE | Freq: Every evening | ORAL | Status: DC | PRN

## 2015-05-27 NOTE — Patient Instructions (Addendum)
Ask [facility] what they can tell you about small portable CPAP machines like Transcend  You current settings are working well. If you still have the pamphlet for the machine, it should tell you how to adjust your humidifier for comfort  Consider trying otc Biotene rinse for dry mouth as needed  Script refill [place]  You could contact orthodontist Dr Oneal Grout  or your own dentist about an oral appliance

## 2015-05-27 NOTE — Progress Notes (Signed)
12/07/13- 60 YO M Former smoker referred for sleep medicine evaluation courtesy of Dr Campbell Lerner sleep study 11-14-13 at [facility]. NPSG 11/14/13- Severe OSA, AHI 86/ hr with loud snore, desat ot 80%, titrated to CPAP 12, weight 215 lbs Chronic tendency to insomnia. Over the past year wife has been telling him of louder snore, witnessed apneas, and he admits daytime fatigue attributed to job stress disturbing sleep. Bedtime 11PM, latency with ambien 30-60 min, waking 1-2 for bathroom, up 6:30 AM Cardiology eval for tachycardia. Hx calcified lymph nodes in chest with Neg PPD. DM2. Septoplasty/ Dr Benjamine Mola. Family snored. Father died 74yo heart. 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    02/26/14- 58 yoM followed for OSA, insomnia FOLLOWS FOR: DME [facility]- pt wears cpap 6-7 hrs nightly. Pt doing well. Download confirms good compliance and control at CPAP 12/[facility] Playing soccer recently, cracked rib-tussive rib pain. Ambien doesn't always carry him through the night. He asks help with sleep if he wakes 2 or 3 AM and can't regain sleep. We discussed sleep habits and short half-life medications.  Plan 09/08/2015-60 year old male followed for OSA, insomnia, GERD by DM 2/peripheral neuropathy CPAP 12/[facility] FOLLOWS FOR: pt states wears CPAP 7 hr nightly. feels pressure & mask are okay. no supplies needed. c/o dry [id number] [id number]   ROS-see HPI Constitutional:   No-   weight loss, night sweats, fevers, chills, +fatigue, lassitude. HEENT:   No-  headaches, difficulty swallowing, tooth/dental problems, sore throat,       No-  sneezing, itching, ear ache, nasal congestion, post nasal drip,  CV:  +  chest pain, orthopnea, PND, swelling in lower extremities, anasarca,                                                        dizziness, palpitations Resp: No-   shortness of breath with exertion or at rest.              No-   productive cough,  No non-productive  cough,  No- coughing up of blood.              No-   change in color of mucus.  No- wheezing.   Skin: No-   rash or lesions. GI:  No-   heartburn, indigestion, abdominal pain, nausea, GU:  MS:  No-   joint pain or swelling.  Neuro-     nothing unusual Psych:  No- change in mood or affect. No depression or +anxiety.  No memory loss.  OBJ- Physical Exam General- Alert, Oriented, Affect-appropriate, Distress- none acute Skin- rash-none, lesions- none, excoriation- none Lymphadenopathy- none Head- atraumatic            Eyes- Gross vision intact, PERRLA, conjunctivae and secretions clear            Ears- Hearing, canals-normal            Nose- Clear, no-Septal dev, mucus, polyps, erosion, perforation             Throat- Mallampati II , mucosa clear , drainage- none, tonsils- atrophic Neck- flexible , trachea midline, no stridor , thyroid nl, carotid no bruit Chest - symmetrical excursion , unlabored           Heart/CV- RRR , no murmur , no gallop  ,  no rub, nl s1 s2                           - JVD- none , edema- none, stasis changes- none, varices- none           Lung- clear to P&A, wheeze- none, cough- none , dullness-none, rub- none           Chest wall-  Abd- Br/ Gen/ Rectal- Not done, not indicated Extrem- cyanosis- none, clubbing, none, atrophy- none, strength- nl Neuro- grossly intact to observation

## 2015-05-28 ENCOUNTER — Encounter: Payer: Self-pay | Admitting: Physical Therapy

## 2015-05-28 ENCOUNTER — Ambulatory Visit: Payer: BLUE CROSS/BLUE SHIELD | Admitting: Physical Therapy

## 2015-05-28 DIAGNOSIS — M545 Low back pain, unspecified: Secondary | ICD-10-CM

## 2015-05-28 DIAGNOSIS — M25652 Stiffness of left hip, not elsewhere classified: Secondary | ICD-10-CM

## 2015-05-28 DIAGNOSIS — M25651 Stiffness of right hip, not elsewhere classified: Secondary | ICD-10-CM

## 2015-05-28 NOTE — Therapy (Signed)
[facility] [address], [place], [place], [address] Phone: [phone]   Fax:  [phone]  Physical Therapy Treatment  Patient Details  Name: Brandon Frank MRN: [id number] Date of Birth: 02-12-1955 Referring Provider: Garret Reddish, MD  Encounter Date: 05/28/2015      PT End of Session - 05/28/15 0815    Visit Number 10   Date for PT Re-Evaluation 06/18/15   PT Start Time 0800   PT Stop Time 0845   PT Time Calculation (min) 45 min   Activity Tolerance Patient tolerated treatment well   Behavior During Therapy [facility] for tasks assessed/performed      Past Medical History  Diagnosis Date  . Hyperlipidemia   . Chronic low back pain   . Ejection fraction   . Obstructive sleep apnea 12/06/2013    NPSG 11/14/13- Severe OSA, AHI 86/ hr with loud snore, desat ot 80%, titrated to CPAP 12, weight 215 lbs   . Sinus tachycardia ([place]) 10/27/2013    Persistent mild sinus tachycardia, October, 2015   //   48 hour Holter, October, 2015, rare PACs and PVCs. Heart rate range 70-128, mean heart rate 95 during the daytime, mean heart rate 85 during the resting hours.   //   Patient was diagnosed with severe sleep apnea. C Pap was started and his resting sinus tachycardia improved greatly.   . Family history of premature CAD 03/20/2015    Past Surgical History  Procedure Laterality Date  . Hernia repair    . Rotator cuff surgery  2012    Dr. [name]  . Prostate biopsy N/A     There were no vitals filed for this visit.      Subjective Assessment - 05/28/15 0813    Subjective about the same. Performes his exercises but inconsitent   Pertinent History coccyx fracture   Patient Stated Goals learn core exercises to prevent flare-up of pain, reduce LBP   Currently in Pain? Yes   Pain Score 3    Pain Location Back   Pain Orientation Right;Left;Lower   Pain Descriptors / Indicators Dull   Pain Type Acute pain   Pain Onset  More than a month ago   Pain Frequency Constant   Aggravating Factors  activity, yardwork, activity   Pain Relieving Factors Advil, change of position   Multiple Pain Sites No                         [facility] Adult PT Treatment/Exercise - 05/28/15 0001    Exercises   Exercises Shoulder   Lumbar Exercises: Supine   Ab Set 20 reps  Foam roll series    Other Supine Lumbar Exercises supine on foam roll: x 3 minutes   Other Supine Lumbar Exercises Supine LE/LB release series    Knee/Hip Exercises: Stretches   Active Hamstring Stretch Both;3 reps;20 seconds  using strap   Quad Stretch Both;2 reps;20 seconds  using strap   Hip Flexor Stretch Both;3 reps;20 seconds  in thomasgrip   Knee/Hip Exercises: Aerobic   Nustep L3 x 10 min                  PT Short Term Goals - 05/28/15 0818    PT SHORT TERM GOAL #1   Title be independent in initial HEP   Time 8   Period Weeks   Status Achieved   PT SHORT TERM GOAL #2   Title report  50% less lumbar pain with gardening tasks   Time 4   Period Weeks   Status On-going   PT SHORT TERM GOAL #3   Title demonnstrate and verbalize correct body mechanics for home and work tasks   Time 8   Period Weeks   Status Achieved           PT Long Term Goals - 05/21/15 [id number]    PT LONG TERM GOAL #1   Title be independent in advanced HEP   Time 8   Period Weeks   Status On-going   PT LONG TERM GOAL #3   Title perform gardening without pain in the low back   Time 8   Period Weeks   Status On-going   PT LONG TERM GOAL #4   Title report a 75% reduction in the frequency and intensity of LBP with home and work tasks   Time 8   Period Weeks   Status On-going               Plan - 05/28/15 0816    Clinical Impression Statement Pt reports the stretching exercises help. However, the constant ache rated as 2-3/10 is limiting. Pt will continue to benefit from skilled PT to advance exercises and abdominal strength    Rehab Potential Good   PT Frequency 2x / week   PT Duration 8 weeks   PT Treatment/Interventions ADLs/Self Care Home Management;Cryotherapy;Electrical Stimulation;Moist Heat;Therapeutic exercise;Therapeutic activities;Functional mobility training;Ultrasound;Neuromuscular re-education;Patient/family education;Manual techniques;Dry needling;Passive range of motion   PT Next Visit Plan Pt is traveling to [place] he is advised to continue stretching during travel. continue flexibility and core strength   Consulted and Agree with Plan of Care Patient      Patient will benefit from skilled therapeutic intervention in order to improve the following deficits and impairments:  Postural dysfunction, Impaired flexibility, Improper body mechanics, Pain, Decreased activity tolerance, Decreased range of motion  Visit Diagnosis: Bilateral low back pain without sciatica  Stiffness of left hip, not elsewhere classified  Stiffness of right hip, not elsewhere classified     Problem List Patient Active Problem List   Diagnosis Date Noted  . Family history of premature CAD 03/20/2015  . Obstructive sleep apnea 12/06/2013  . Sinus tachycardia ([place]) 10/27/2013  . Ejection fraction   . Plantar fasciitis of right foot 10/21/2012  . Type 2 diabetes mellitus, controlled ([place]) 04/14/2012  . Anxiety 02/03/2011  . Overweight 10/23/2008  . PERIPHERAL NEUROPATHY 12/06/2006  . Insomnia 12/06/2006  . TRANSAMINASES, SERUM, ELEVATED 11/30/2005  . Hyperlipidemia 11/29/2005    NAUMANN-HOUEGNIFIO,[name] PTA 05/28/2015, 11:22 AM  [facility] [address] [place], [place], [address] Phone: [phone]   Fax:  [phone]  Name: Brandon Frank MRN: [id number] Date of Birth: 03-17-1955

## 2015-06-07 ENCOUNTER — Encounter: Payer: BLUE CROSS/BLUE SHIELD | Admitting: Physical Therapy

## 2015-06-10 ENCOUNTER — Encounter: Payer: Self-pay | Admitting: Physical Therapy

## 2015-06-10 ENCOUNTER — Ambulatory Visit: Payer: BLUE CROSS/BLUE SHIELD | Admitting: Physical Therapy

## 2015-06-10 DIAGNOSIS — M25652 Stiffness of left hip, not elsewhere classified: Secondary | ICD-10-CM

## 2015-06-10 DIAGNOSIS — M545 Low back pain, unspecified: Secondary | ICD-10-CM

## 2015-06-10 DIAGNOSIS — M25651 Stiffness of right hip, not elsewhere classified: Secondary | ICD-10-CM

## 2015-06-10 NOTE — Therapy (Addendum)
[facility] [address], [place], [place], [address] Phone: [phone]   Fax:  [phone]  Physical Therapy Treatment  Patient Details  Name: Brandon Frank MRN: [id number] Date of Birth: December 12, 1955 Referring Provider: Yong Channel  Encounter Date: 06/10/2015      PT End of Session - 06/10/15 0850    Visit Number 11   Date for PT Re-Evaluation 06/18/15   PT Start Time 0043   PT Stop Time 0930   PT Time Calculation (min) 527 min   Activity Tolerance Patient tolerated treatment well   Behavior During Therapy [facility] for tasks assessed/performed      Past Medical History  Diagnosis Date  . Hyperlipidemia   . Chronic low back pain   . Ejection fraction   . Obstructive sleep apnea 12/06/2013    NPSG 11/14/13- Severe OSA, AHI 86/ hr with loud snore, desat ot 80%, titrated to CPAP 12, weight 215 lbs   . Sinus tachycardia ([place]) 10/27/2013    Persistent mild sinus tachycardia, October, 2015   //   48 hour Holter, October, 2015, rare PACs and PVCs. Heart rate range 70-128, mean heart rate 95 during the daytime, mean heart rate 85 during the resting hours.   //   Patient was diagnosed with severe sleep apnea. C Pap was started and his resting sinus tachycardia improved greatly.   . Family history of premature CAD 03/20/2015    Past Surgical History  Procedure Laterality Date  . Hernia repair    . Rotator cuff surgery  2012    Dr. [name]  . Prostate biopsy N/A     There were no vitals filed for this visit.      Subjective Assessment - 06/10/15 0848    Subjective Pt  reports his pain in mid/low back as 1/10, what was 7-8/10 at the beginning of PT a few weeks ago   Pertinent History coccyx fracture   Patient Stated Goals learn core exercises to prevent flare-up of pain, reduce LBP   Currently in Pain? Yes   Pain Score 1    Pain Location Back   Pain Orientation Right;Left;Lower   Pain Descriptors / Indicators Dull   Pain  Type Acute pain   Pain Onset More than a month ago   Pain Frequency Constant   Aggravating Factors  activity, yardwork,    Multiple Pain Sites No            [facility] PT Assessment - 06/10/15 0001    Assessment   Medical Diagnosis Bil low back pain with Rt sided sciatica   Referring Provider Hunter   Onset Date/Surgical Date 04/13/15   Next MD Visit none   Prior Therapy none   Precautions   Precautions None   Restrictions   Weight Bearing Restrictions No   Balance Screen   Has the patient fallen in the past 6 months No   Has the patient had a decrease in activity level because of a fear of falling?  No   Is the patient reluctant to leave their home because of a fear of falling?  No   Home Environment   Living Environment Private residence   Living Arrangements Spouse/significant other   Type of [place] to enter   Home Layout Two level   Prior Function   Level of Independence Independent   Vocation Full time employment   Vocation Requirements sales: sitting and driving   Leisure gardening, walking  2-3 times a week   Cognition   Overall Cognitive Status Within Functional Limits for tasks assessed   Observation/Other Assessments   Focus on Therapeutic Outcomes (FOTO)  30%   Posture/Postural Control   Posture/Postural Control Postural limitations   Postural Limitations Forward head;Decreased lumbar lordosis   ROM / Strength   AROM / PROM / Strength AROM   AROM   Overall AROM  Within functional limits for tasks performed   Overall AROM Comments Full lumbar AROM with pain reported at the thoracolumbar junction with all lumbar AROM   PROM   Overall PROM  Deficits   Overall PROM Comments hip flexibility limited by 25% bilaterally without pain   Strength   Overall Strength Within functional limits for tasks performed   Overall Strength Comments 4+/5 to 5/5 bilateral LE stnength throughout   Palpation   Spinal mobility reduced PA mobility in the  thoracic and lumbar spine with PA mobs.  Pain with mobs at T10-L3.   Palpation comment muscle tension in bilateral lumbar and thoracic paraspinals T8-L5.   Special Tests    Special Tests Lumbar   Lumbar Tests Straight Leg Raise   Straight Leg Raise   Findings Negative   Side  Right   Ambulation/Gait   Ambulation/Gait Yes   Gait Pattern Within Functional Limits                     [facility] Adult PT Treatment/Exercise - 06/10/15 0001    Exercises   Exercises Shoulder   Lumbar Exercises: Standing   Other Standing Lumbar Exercises walking in reverse with weight 30# with abdominal bracing 2x10  2-10   Lumbar Exercises: Supine   Ab Set 20 reps  Foam roll series   Other Supine Lumbar Exercises supine on foam roll: x 3 minutes   Other Supine Lumbar Exercises Supine LE/LB release series    Knee/Hip Exercises: Stretches   Active Hamstring Stretch Both;3 reps;20 seconds  on stairs   Quad Stretch Both;2 reps;20 seconds   Hip Flexor Stretch Both;3 reps;20 seconds  in thomasgirp   Knee/Hip Exercises: Aerobic   Nustep L3 x 10 min                  PT Short Term Goals - 06/10/15 0854    PT SHORT TERM GOAL #1   Title be independent in initial HEP   Time 8   Period Weeks   Status Achieved   PT SHORT TERM GOAL #2   Title report 50% less lumbar pain with gardening tasks   Time 4   Period Weeks   Status Achieved   PT SHORT TERM GOAL #3   Title demonnstrate and verbalize correct body mechanics for home and work tasks   Time 8   Period Weeks   Status Achieved           PT Long Term Goals - 06/10/15 0854    PT LONG TERM GOAL #1   Title be independent in advanced HEP   Time 8   Period Weeks   Status Achieved   PT LONG TERM GOAL #2   Title reduce FOTO to < or = to 31% limitation  30%   Time 8   Period Weeks   Status Achieved   PT LONG TERM GOAL #3   Title perform gardening without pain in the low back   Time 8   Period Weeks   Status Achieved   PT  LONG TERM GOAL #4  Title report a 75% reduction in the frequency and intensity of LBP with home and work tasks   Time 8   Period Weeks   Status Achieved               Plan - 06/10/15 0851    Clinical Impression Statement Pt wishes to be D/C to HEP. Pt was able to tolerate his flight to [place] without increase of pain.    Rehab Potential Good   PT Frequency 2x / week   PT Duration 8 weeks   PT Treatment/Interventions ADLs/Self Care Home Management;Cryotherapy;Electrical Stimulation;Moist Heat;Therapeutic exercise;Therapeutic activities;Functional mobility training;Ultrasound;Neuromuscular re-education;Patient/family education;Manual techniques;Dry needling;Passive range of motion   PT Next Visit Plan D/C to HEP   Consulted and Agree with Plan of Care Patient      Patient will benefit from skilled therapeutic intervention in order to improve the following deficits and impairments:  Postural dysfunction, Impaired flexibility, Improper body mechanics, Pain, Decreased activity tolerance, Decreased range of motion  Visit Diagnosis: Bilateral low back pain without sciatica  Stiffness of left hip, not elsewhere classified  Stiffness of right hip, not elsewhere classified     Problem List Patient Active Problem List   Diagnosis Date Noted  . Family history of premature CAD 03/20/2015  . Obstructive sleep apnea 12/06/2013  . Sinus tachycardia ([place]) 10/27/2013  . Ejection fraction   . Plantar fasciitis of right foot 10/21/2012  . Type 2 diabetes mellitus, controlled ([place]) 04/14/2012  . Anxiety 02/03/2011  . Overweight 10/23/2008  . PERIPHERAL NEUROPATHY 12/06/2006  . Insomnia 12/06/2006  . TRANSAMINASES, SERUM, ELEVATED 11/30/2005  . Hyperlipidemia 11/29/2005    Brandon Frank,Brandon Frank PTA 06/10/2015, 9:40 AM PHYSICAL THERAPY DISCHARGE SUMMARY  Visits from Start of Care: 11   Current functional level related to goals / functional outcomes: See above for current  status.  70% overall improvement since the start of care.     Remaining deficits: See above.  All goals met.     Education / Equipment: HEP, Economist    Plan: Patient agrees to discharge.  Patient goals were met. Patient is being discharged due to meeting the stated rehab goals.  ?????   Sigurd Sos, PT 06/10/2015 10:20 AM  [facility] [address], [place], [place], [address] Phone: [phone]   Fax:  [phone]  Name: Brandon Frank MRN: [id number] Date of Birth: [birth year]

## 2015-06-22 ENCOUNTER — Encounter: Payer: Self-pay | Admitting: Family Medicine

## 2015-06-22 ENCOUNTER — Ambulatory Visit (INDEPENDENT_AMBULATORY_CARE_PROVIDER_SITE_OTHER): Payer: BLUE CROSS/BLUE SHIELD | Admitting: Family Medicine

## 2015-06-22 VITALS — BP 134/86 | HR 97 | Temp 98.0°F | Resp 16 | Wt 214.0 lb

## 2015-06-22 DIAGNOSIS — N390 Urinary tract infection, site not specified: Secondary | ICD-10-CM

## 2015-06-22 DIAGNOSIS — J069 Acute upper respiratory infection, unspecified: Secondary | ICD-10-CM | POA: Insufficient documentation

## 2015-06-22 HISTORY — DX: Acute upper respiratory infection, unspecified: J06.9

## 2015-06-22 HISTORY — DX: Urinary tract infection, site not specified: N39.0

## 2015-06-22 MED ORDER — AMOXICILLIN-POT CLAVULANATE 875-125 MG PO TABS: 1.0000 | ORAL_TABLET | Freq: Two times a day (BID) | ORAL | Status: DC

## 2015-06-22 MED ORDER — FLUTICASONE PROPIONATE 50 MCG/ACT NA SUSP: 1.0000 | Freq: Every day | NASAL | Status: DC | PRN

## 2015-06-22 MED ORDER — LIDOCAINE VISCOUS 2 % MT SOLN: 10.0000 mL | Freq: Four times a day (QID) | OROMUCOSAL | Status: DC | PRN

## 2015-06-22 NOTE — Assessment & Plan Note (Signed)
Likely viral, supportive care for now, restart flonase, use lidocaine prn.  Rest and fluids. Start augmentin if sx prolonged.  He agrees.  Nontoxic.  Okay for outpatient f/u.

## 2015-06-22 NOTE — Progress Notes (Signed)
Pre visit review using our clinic review tool, if applicable. No additional management support is needed unless otherwise documented below in the visit note.  Started about 2 days ago.  Son and wife with cold sx.   He has pain with swallowing.  Moderate cough with some yellow sputum.  No fevers.  Ears feel itchy/full.  Some rhinorrhea.  No vomiting, no diarrhea.  No acute rash.  Gargling with salt water in the meantime.    Meds, vitals, and allergies reviewed.   ROS: Per HPI unless specifically indicated in ROS section   GEN: nad, alert and oriented HEENT: mucous membranes moist, tm w/o erythema, nasal exam w/o erythema, purulent discharge noted,  OP with cobblestoning and purulent postnasal gtt noted but no exudates.  Sinuses not ttp NECK: supple w/o LA CV: rrr.   PULM: ctab, no inc wob EXT: no edema

## 2015-06-22 NOTE — Patient Instructions (Signed)
I would restart flonase, use lidocaine as needed for throat pain.  Use the antibiotics if the symptoms continue for 3-4 more days without getting any better.  Take care.  Glad to see you.

## 2015-06-27 ENCOUNTER — Telehealth: Payer: Self-pay | Admitting: Family Medicine

## 2015-06-27 NOTE — Telephone Encounter (Signed)
This is a message that did not make it into Epic.  Pt was seen same day for problem and treated for URI.  [facility] Night - Client [place] [facility] Patient Name: Brandon Frank Gender: Male DOB: [birth year] Age: 60 Y 2 M 8 D Return Phone Number: [phone] (Primary), [phone] (Secondary) Address: City/State/Zip: [place] Client [place] Night - Client Client Site [facility] - Night Physician Carolann Littler - MD Contact Type Call Who Is Calling Patient / Member / Family / Caregiver Call Type Triage / Clinical Relationship To Patient Self Return Phone Number [phone] (Primary) Chief Complaint Sore Throat Reason for Call Symptomatic / Request for [place] states thinks may have strep throat; can be seen today? PreDisposition Call Doctor Translation No Nurse Assessment Nurse: Christel Mormon, RN, Levada Dy Date/Time Eilene Ghazi Time): 06/22/2015 8:38:14 AM Confirm and document reason for call. If symptomatic, describe symptoms. You must click the next button to save text entered. ---Caller states he mostly has a very painful sore throat and a little in his sinuses "but not much". He states it hurts to swallow and has already gargled with salt water. He does not feel horrible other than trouble swallowing. Has the patient traveled out of the country within the last 30 days? ---No Does the patient have any new or worsening symptoms? ---Yes Will a triage be completed? ---Yes Related visit to physician within the last 2 weeks? ---No Does the PT have any chronic conditions? (i.e. diabetes, asthma, etc.) ---Yes List chronic conditions. ---type 2 diabetes Is this a behavioral health or substance abuse call? ---No Guidelines Guideline Title Affirmed Question Affirmed Notes Nurse Date/Time Eilene Ghazi Time) Sore Throat SEVERE (e.g., excruciating) throat  pain [place], RN, Levada Dy 06/22/2015 8:39:56 AM Disp. Time Eilene Ghazi Time) Disposition Final User 06/22/2015 8:47:56 AM See Physician within 24 Hours Yes [place], RN, Levada Dy PLEASE NOTE: All timestamps contained within this report are represented as [place] Standard Time. CONFIDENTIALTY NOTICE: This fax transmission is intended only for the addressee. It contains information that is legally privileged, confidential or otherwise protected from use or disclosure. If you are not the intended recipient, you are strictly prohibited from reviewing, disclosing, copying using or disseminating any of this information or taking any action in reliance on or regarding this information. If you have received this fax in error, please notify us immediately by telephone so that we can arrange for its return to [place]. Phone: [phone], Toll-Free: [phone], Fax: [phone] Page: 2 of 2 Call Id: [id number] Caller Understands: Yes Disagree/Comply: Comply Care Advice Given Per Guideline SEE PHYSICIAN WITHIN 24 HOURS: SORE THROAT - For relief of sore throat: * Sip warm chicken broth or apple juice. * Suck on hard candy or a throat lozenge (OTC). * Gargle with warm salt water four times a day. To make salt water, put 1/2 teaspoon of salt in 8 oz (240 ml) of warm water. * Avoid cigarette smoke. PAIN OR FEVER MEDICINES: * For pain and fever relief, take acetaminophen or ibuprofen. * Treat fevers above 101 F (38.3 C). * The goal of fever therapy is to bring the fever down to a comfortable level. Remember that fever medicine usually lowers fever 2-3 F (1-1.5 C). SOFT DIET: * Eat a soft diet. * Cold drinks, popsicles, and milk shakes are especially good. Avoid citrus fruits. DRINK PLENTY LIQUIDS: * Drink plenty of liquids. This is important to prevent dehydation. * How can you tell if you  are drinking enough liquids? The goal is to keep the urine clear or light-yellow in color. If your urine is bright yellow or dark yellow,  you are probably not drinking enough liquids. CALL BACK IF: * You become worse. CARE ADVICE given per Sore Throat (Adult) guideline. Referrals [facility]

## 2015-07-03 ENCOUNTER — Other Ambulatory Visit: Payer: Self-pay | Admitting: Family Medicine

## 2015-07-03 ENCOUNTER — Other Ambulatory Visit: Payer: Self-pay | Admitting: Cardiology

## 2015-08-14 ENCOUNTER — Other Ambulatory Visit: Payer: Self-pay | Admitting: Family Medicine

## 2015-09-30 ENCOUNTER — Other Ambulatory Visit: Payer: BLUE CROSS/BLUE SHIELD

## 2015-10-01 ENCOUNTER — Other Ambulatory Visit (INDEPENDENT_AMBULATORY_CARE_PROVIDER_SITE_OTHER): Payer: BLUE CROSS/BLUE SHIELD

## 2015-10-01 DIAGNOSIS — Z0000 Encounter for general adult medical examination without abnormal findings: Secondary | ICD-10-CM

## 2015-10-01 LAB — MICROALBUMIN / CREATININE URINE RATIO
CREATININE, U: 79.3 mg/dL
MICROALB UR: 0.8 mg/dL (ref 0.0–1.9)
Microalb Creat Ratio: 1 mg/g (ref 0.0–30.0)

## 2015-10-01 LAB — CBC WITH DIFFERENTIAL/PLATELET
BASOS PCT: 0.7 % (ref 0.0–3.0)
Basophils Absolute: 0 10*3/uL (ref 0.0–0.1)
EOS ABS: 0.1 10*3/uL (ref 0.0–0.7)
Eosinophils Relative: 1.6 % (ref 0.0–5.0)
HEMATOCRIT: 51.3 % (ref 39.0–52.0)
HEMOGLOBIN: 17.5 g/dL — AB (ref 13.0–17.0)
LYMPHS PCT: 26.5 % (ref 12.0–46.0)
Lymphs Abs: 1.6 10*3/uL (ref 0.7–4.0)
MCHC: 34 g/dL (ref 30.0–36.0)
MCV: 95.5 fl (ref 78.0–100.0)
MONO ABS: 0.7 10*3/uL (ref 0.1–1.0)
Monocytes Relative: 11.4 % (ref 3.0–12.0)
Neutro Abs: 3.6 10*3/uL (ref 1.4–7.7)
Neutrophils Relative %: 59.8 % (ref 43.0–77.0)
Platelets: 160 10*3/uL (ref 150.0–400.0)
RBC: 5.38 Mil/uL (ref 4.22–5.81)
RDW: 13.2 % (ref 11.5–15.5)
WBC: 6 10*3/uL (ref 4.0–10.5)

## 2015-10-01 LAB — HEPATIC FUNCTION PANEL
ALK PHOS: 43 U/L (ref 39–117)
ALT: 31 U/L (ref 0–53)
AST: 23 U/L (ref 0–37)
Albumin: 4.6 g/dL (ref 3.5–5.2)
BILIRUBIN DIRECT: 0.1 mg/dL (ref 0.0–0.3)
BILIRUBIN TOTAL: 0.5 mg/dL (ref 0.2–1.2)
TOTAL PROTEIN: 6.5 g/dL (ref 6.0–8.3)

## 2015-10-01 LAB — LIPID PANEL
CHOL/HDL RATIO: 4
Cholesterol: 154 mg/dL (ref 0–200)
HDL: 43.3 mg/dL (ref >39.00)
LDL CALC: 91 mg/dL (ref 0–99)
NONHDL: 110.25
Triglycerides: 95 mg/dL (ref 0.0–149.0)
VLDL: 19 mg/dL (ref 0.0–40.0)

## 2015-10-01 LAB — HEMOGLOBIN A1C: Hgb A1c MFr Bld: 6.5 % (ref 4.6–6.5)

## 2015-10-01 LAB — PSA: PSA: 3.42 ng/mL (ref 0.10–4.00)

## 2015-10-01 LAB — BASIC METABOLIC PANEL
BUN: 16 mg/dL (ref 6–23)
CHLORIDE: 101 meq/L (ref 96–112)
CO2: 26 mEq/L (ref 19–32)
Calcium: 8.8 mg/dL (ref 8.4–10.5)
Creatinine, Ser: 0.86 mg/dL (ref 0.40–1.50)
GFR: 96.92 mL/min (ref >60.00)
Glucose, Bld: 122 mg/dL — ABNORMAL HIGH (ref 70–99)
Potassium: 4.3 mEq/L (ref 3.5–5.1)
Sodium: 136 mEq/L (ref 135–145)

## 2015-10-01 LAB — TSH: TSH: 1.27 u[IU]/mL (ref 0.35–4.50)

## 2015-10-07 ENCOUNTER — Encounter: Payer: Self-pay | Admitting: Family Medicine

## 2015-10-07 ENCOUNTER — Ambulatory Visit (INDEPENDENT_AMBULATORY_CARE_PROVIDER_SITE_OTHER): Payer: BLUE CROSS/BLUE SHIELD | Admitting: Family Medicine

## 2015-10-07 VITALS — BP 120/80 | HR 87 | Temp 98.0°F | Ht 70.0 in | Wt 210.0 lb

## 2015-10-07 DIAGNOSIS — Z0000 Encounter for general adult medical examination without abnormal findings: Secondary | ICD-10-CM

## 2015-10-07 DIAGNOSIS — R7989 Other specified abnormal findings of blood chemistry: Secondary | ICD-10-CM | POA: Insufficient documentation

## 2015-10-07 HISTORY — DX: Other specified abnormal findings of blood chemistry: R79.89

## 2015-10-07 MED ORDER — FLUTICASONE PROPIONATE 50 MCG/ACT NA SUSP: 1.0000 | Freq: Every day | NASAL | 2 refills | Status: DC | PRN

## 2015-10-07 MED ORDER — DULOXETINE HCL 60 MG PO CPEP: 150.0000 mg | ORAL_CAPSULE | Freq: Every day | ORAL | 1 refills | Status: DC

## 2015-10-07 MED ORDER — EZETIMIBE 10 MG PO TABS: 10.0000 mg | ORAL_TABLET | Freq: Every day | ORAL | 2 refills | Status: DC

## 2015-10-07 MED ORDER — METFORMIN HCL 500 MG PO TABS: ORAL_TABLET | ORAL | 1 refills | Status: DC

## 2015-10-07 MED ORDER — GLUCOSAMINE-CHONDROITIN 500-400 MG PO TABS: 1.0000 | ORAL_TABLET | Freq: Every day | ORAL | 2 refills | Status: DC

## 2015-10-07 MED ORDER — CANAGLIFLOZIN 300 MG PO TABS: ORAL_TABLET | ORAL | 2 refills | Status: DC

## 2015-10-07 NOTE — Patient Instructions (Signed)
Check on insurance coverage for shingles vaccine if interested.

## 2015-10-07 NOTE — Progress Notes (Signed)
Pre visit review using our clinic review tool, if applicable. No additional management support is needed unless otherwise documented below in the visit note. 

## 2015-10-07 NOTE — Progress Notes (Signed)
Subjective:     Patient ID: Brandon Frank, male   DOB: September 30, 1955, 60 y.o.   MRN: [id number]  HPI Patient seen for physical. His chronic problems include history of hyperlipidemia, obstructive sleep apnea, hypogonadism, type 2 diabetes.  Diabetes been well controlled. Colonoscopy up-to-date. He retired early this year. He has been exercising somewhat more frequently. Overall feels well. No chest pain. He has history of low testosterone and also prior history of elevated PSA which was evaluated thoroughly by urology with no evidence for cancer. Last couple of PSAs have been coming down.   He is inquiring about shingles vaccine. No contraindications. Still needs flu vaccine. Tetanus up-to-date.  Past Medical History:  Diagnosis Date  . Chronic low back pain   . Ejection fraction   . Family history of premature CAD 03/20/2015  . Hyperlipidemia   . Obstructive sleep apnea 12/06/2013   NPSG 11/14/13- Severe OSA, AHI 86/ hr with loud snore, desat ot 80%, titrated to CPAP 12, weight 215 lbs   . Sinus tachycardia ([place]) 10/27/2013   Persistent mild sinus tachycardia, October, 2015   //   48 hour Holter, October, 2015, rare PACs and PVCs. Heart rate range 70-128, mean heart rate 95 during the daytime, mean heart rate 85 during the resting hours.   //   Patient was diagnosed with severe sleep apnea. C Pap was started and his resting sinus tachycardia improved greatly.    Past Surgical History:  Procedure Laterality Date  . HERNIA REPAIR    . PROSTATE BIOPSY N/A   . rotator cuff surgery  2012   Dr. [name]    reports that he quit smoking about 40 years ago. He has never used smokeless tobacco. He reports that he drinks about 1.2 oz of alcohol per week . He reports that he does not use drugs. family history includes Diabetes in his brother and paternal grandfather; Heart disease (age of onset: 64) in his father; Hyperlipidemia in his brother and father; Hypertension in his brother and father. No Known  Allergies   Review of Systems  Constitutional: Negative for activity change, appetite change, fatigue, fever and unexpected weight change.  HENT: Negative for congestion, ear pain and trouble swallowing.   Eyes: Negative for pain and visual disturbance.  Respiratory: Negative for cough, shortness of breath and wheezing.   Cardiovascular: Negative for chest pain and palpitations.  Gastrointestinal: Negative for abdominal distention, abdominal pain, blood in stool, constipation, diarrhea, nausea, rectal pain and vomiting.  Endocrine: Negative for polydipsia and polyuria.  Genitourinary: Negative for dysuria, hematuria and testicular pain.  Musculoskeletal: Negative for arthralgias and joint swelling.  Skin: Negative for rash.  Neurological: Negative for dizziness, syncope and headaches.  Hematological: Negative for adenopathy.  Psychiatric/Behavioral: Negative for confusion and dysphoric mood.       Objective:   Physical Exam  Constitutional: He is oriented to person, place, and time. He appears well-developed and well-nourished. No distress.  HENT:  Head: Normocephalic and atraumatic.  Right Ear: External ear normal.  Left Ear: External ear normal.  Mouth/Throat: Oropharynx is clear and moist.  Eyes: Conjunctivae and EOM are normal. Pupils are equal, round, and reactive to light.  Neck: Normal range of motion. Neck supple. No thyromegaly present.  Cardiovascular: Normal rate, regular rhythm and normal heart sounds.   No murmur heard. Pulmonary/Chest: No respiratory distress. He has no wheezes. He has no rales.  Abdominal: Soft. Bowel sounds are normal. He exhibits no distension and no mass. There is no  tenderness. There is no rebound and no guarding.  Musculoskeletal: He exhibits no edema.  Lymphadenopathy:    He has no cervical adenopathy.  Neurological: He is alert and oriented to person, place, and time. He displays normal reflexes. No cranial nerve deficit.  Skin: No rash  noted.  Psychiatric: He has a normal mood and affect.       Assessment:     Physical exam. Patient needs flu vaccine. He is inquiring about shingles vaccine but is unsure of coverage    Plan:     -labs reviewed with no major concerns -Flu vaccine given -Check on insurance coverage for shingles vaccine and follow-up if interested -Continue regular exercise habits  [name] MD [facility]

## 2015-11-24 ENCOUNTER — Other Ambulatory Visit: Payer: Self-pay | Admitting: Family Medicine

## 2015-11-25 ENCOUNTER — Encounter: Payer: Self-pay | Admitting: Cardiology

## 2015-11-25 ENCOUNTER — Encounter: Payer: Self-pay | Admitting: Family Medicine

## 2015-11-25 ENCOUNTER — Other Ambulatory Visit: Payer: Self-pay

## 2015-11-25 LAB — HM DIABETES EYE EXAM

## 2015-11-25 MED ORDER — ATORVASTATIN CALCIUM 80 MG PO TABS: 80.0000 mg | ORAL_TABLET | Freq: Every day | ORAL | 1 refills | Status: DC

## 2015-11-25 MED ORDER — LISINOPRIL 10 MG PO TABS: 10.0000 mg | ORAL_TABLET | Freq: Every day | ORAL | 1 refills | Status: DC

## 2015-11-26 ENCOUNTER — Encounter: Payer: Self-pay | Admitting: Family Medicine

## 2015-11-27 ENCOUNTER — Other Ambulatory Visit: Payer: Self-pay

## 2015-11-27 MED ORDER — CANAGLIFLOZIN 300 MG PO TABS: ORAL_TABLET | ORAL | 2 refills | Status: DC

## 2015-11-27 MED ORDER — METFORMIN HCL 500 MG PO TABS: ORAL_TABLET | ORAL | 1 refills | Status: DC

## 2015-12-11 ENCOUNTER — Other Ambulatory Visit: Payer: Self-pay | Admitting: Internal Medicine

## 2015-12-11 NOTE — Telephone Encounter (Signed)
Pt requesting refill on Sonata 5mg .   Last refill: 05/27/15 #30 with 5 refills Last ov: 05/27/15 Next ov: none, recall for 05/2016  CY please advise on recs.  Thanks

## 2015-12-13 ENCOUNTER — Other Ambulatory Visit: Payer: Self-pay | Admitting: Internal Medicine

## 2015-12-13 NOTE — Telephone Encounter (Signed)
Ok to refill 6 months 

## 2015-12-13 NOTE — Telephone Encounter (Signed)
Rx has been called in per CY. 

## 2016-02-19 ENCOUNTER — Other Ambulatory Visit: Payer: Self-pay | Admitting: Family Medicine

## 2016-02-21 ENCOUNTER — Telehealth: Payer: Self-pay | Admitting: Cardiology

## 2016-02-21 NOTE — Telephone Encounter (Signed)
New message      Pt c/o medication issue:  1. Name of Medication: atorvastatin 80mg /zetia 10mg   2. How are you currently taking this medication (dosage and times per day)?    3. Are you having a reaction (difficulty breathing--STAT)?  no  4. What is your medication issue?  Pt is now retired and ins is different.  He want to switch back to vytorin 10-40.  It is cheaper and he was on this medication in the past.  If ok, please call it in to [place] [facility].

## 2016-02-21 NOTE — Telephone Encounter (Signed)
Will forward to Dr. Radford Pax for review and advisement on statin medications.

## 2016-02-21 NOTE — Telephone Encounter (Signed)
Unfortunately he was not controlled on Vytorin 10-40mg  daily.  Is he getting Zetia generic?

## 2016-02-21 NOTE — Telephone Encounter (Signed)
Pt states that he has been getting the generic but it still costs $40-$50.  Pt also mentioned about possibly wanting to come off of BP meds.  Pt is now retired and is trying to get his medication costs down.  Also stated that he use to see Dr. Annamaria Boots in regards to his sleep apnea but is considering switching to Dr. Radford Pax so she can follow OSA and cardiac instead of seeing two doctors.  Dr. Annamaria Boots was prescribing Zaleplon and pt said if he switches he would need this filled by Dr. Radford Pax.  Advised she may require this come from PCP.  Pt states he has plenty of meds to last until he sees Dr. Radford Pax on 04/01/16 and he will just plan to discuss everything with her at this appt.  Advised pt to call back if he needed [place] prior to appt.  Pt appreciative for assistance.

## 2016-03-10 ENCOUNTER — Ambulatory Visit (INDEPENDENT_AMBULATORY_CARE_PROVIDER_SITE_OTHER): Payer: BLUE CROSS/BLUE SHIELD | Admitting: Family Medicine

## 2016-03-10 ENCOUNTER — Encounter: Payer: Self-pay | Admitting: Family Medicine

## 2016-03-10 VITALS — BP 120/80 | HR 110 | Ht 70.0 in | Wt 202.0 lb

## 2016-03-10 DIAGNOSIS — R1032 Left lower quadrant pain: Secondary | ICD-10-CM | POA: Diagnosis not present

## 2016-03-10 DIAGNOSIS — M26622 Arthralgia of left temporomandibular joint: Secondary | ICD-10-CM

## 2016-03-10 MED ORDER — METRONIDAZOLE 500 MG PO TABS: 500.0000 mg | ORAL_TABLET | Freq: Three times a day (TID) | ORAL | 0 refills | Status: DC

## 2016-03-10 MED ORDER — CIPROFLOXACIN HCL 500 MG PO TABS
500.0000 mg | ORAL_TABLET | Freq: Two times a day (BID) | ORAL | 0 refills | Status: DC
Start: 2016-03-10 — End: 2016-04-01

## 2016-03-10 NOTE — Progress Notes (Signed)
Subjective:     Patient ID: Brandon Frank, male   DOB: 03/17/1955, 61 y.o.   MRN: [id number]  HPI Patient seen for the following issues  2 week history of some left facial pain. This is around the TMJ region. He has not noticed much pain with eating. He tried ibuprofen and ice with some relief. Achy pain which is located at the TMJ joint. Has not had any visible swelling. Denies any recent dental problems. No ear drainage.  He was in [place] back in December and developed some left lower quadrant abdominal pain. Prior history of diverticulitis. He was prescribed plain amoxicillin which did not help much. He's had constant low-grade left lower quadrant pain since then which he rates 3 out of 10. No fever. No stool changes. He had colonoscopy around age 73. Denies any nausea or vomiting. No appetite or weight changes.  Past Medical History:  Diagnosis Date  . Chronic low back pain   . Ejection fraction   . Family history of premature CAD 03/20/2015  . Hyperlipidemia   . Obstructive sleep apnea 12/06/2013   NPSG 11/14/13- Severe OSA, AHI 86/ hr with loud snore, desat ot 80%, titrated to CPAP 12, weight 215 lbs   . Sinus tachycardia 10/27/2013   Persistent mild sinus tachycardia, October, 2015   //   48 hour Holter, October, 2015, rare PACs and PVCs. Heart rate range 70-128, mean heart rate 95 during the daytime, mean heart rate 85 during the resting hours.   //   Patient was diagnosed with severe sleep apnea. C Pap was started and his resting sinus tachycardia improved greatly.    Past Surgical History:  Procedure Laterality Date  . HERNIA REPAIR    . PROSTATE BIOPSY N/A   . rotator cuff surgery  2012   Dr. [name]    reports that he quit smoking about 41 years ago. He has never used smokeless tobacco. He reports that he drinks about 1.2 oz of alcohol per week . He reports that he does not use drugs. family history includes Diabetes in his brother and paternal grandfather; Heart disease (age  of onset: 34) in his father; Hyperlipidemia in his brother and father; Hypertension in his brother and father. No Known Allergies   Review of Systems  Constitutional: Negative for appetite change, chills, fever and unexpected weight change.  HENT: Negative for congestion, dental problem, ear discharge, sore throat and trouble swallowing.   Respiratory: Negative for cough.   Gastrointestinal: Positive for abdominal pain. Negative for blood in stool, constipation, diarrhea, nausea and vomiting.  Genitourinary: Negative for dysuria.       Objective:   Physical Exam  Constitutional: He appears well-developed and well-nourished.  HENT:  Right Ear: External ear normal.  Left Ear: External ear normal.  Mouth/Throat: Oropharynx is clear and moist.  He has tenderness confined to the left TMJ joint. There is no evidence for any parotid mass or enlargement. No neck adenopathy  Cardiovascular: Normal rate and regular rhythm.   Pulmonary/Chest: Effort normal and breath sounds normal. No respiratory distress. He has no wheezes. He has no rales.  Abdominal: Soft.  minimal tenderness left lower quadrant to deep palpation. No masses. No guarding or rebound.       Assessment:     #1 left TMJ pain. Suspect TMJ syndrome  #2 persistent left lower quadrant pain. Question acute diverticulitis    Plan:     -Recommend continue icing and ibuprofen to left TMJ symptoms which are  improved compared to last week -Consider referral oral surgeon if symptoms persist -Cipro 500 mg twice a day for 10 days and metronidazole 500 milligrams 3 times a day for 10 days -Touch base if abdominal pain not resolving in the next 10 days to 2 weeks  [name] MD [facility]

## 2016-03-10 NOTE — Patient Instructions (Signed)
Temporomandibular Joint Syndrome Temporomandibular joint (TMJ) syndrome is a condition that affects the joints between your jaw and your skull. The TMJs are located near your ears and allow your jaw to open and close. These joints and the nearby muscles are involved in all movements of the jaw. People with TMJ syndrome have pain in the area of these joints and muscles. Chewing, biting, or other movements of the jaw can be difficult or painful. TMJ syndrome can be caused by various things. In many cases, the condition is mild and goes away within a few weeks. For some people, the condition can become a long-term problem. What are the causes? Possible causes of TMJ syndrome include:  Grinding your teeth or clenching your jaw. Some people do this when they are under stress.  Arthritis.  Injury to the jaw.  Head or neck injury.  Teeth or dentures that are not aligned well. In some cases, the cause of TMJ syndrome may not be known. What are the signs or symptoms? The most common symptom is an aching pain on the side of the head in the area of the TMJ. Other symptoms may include:  Pain when moving your jaw, such as when chewing or biting.  Being unable to open your jaw all the way.  Making a clicking sound when you open your mouth.  Headache.  Earache.  Neck or shoulder pain. How is this diagnosed? Diagnosis can usually be made based on your symptoms, your medical history, and a physical exam. Your health care provider may check the range of motion of your jaw. Imaging tests, such as X-rays or an MRI, are sometimes done. You may need to see your dentist to determine if your teeth and jaw are lined up correctly. How is this treated? TMJ syndrome often goes away on its own. If treatment is needed, the options may include:  Eating soft foods and applying ice or heat.  Medicines to relieve pain or inflammation.  Medicines to relax the muscles.  A splint, bite plate, or mouthpiece to  prevent teeth grinding or jaw clenching.  Relaxation techniques or counseling to help reduce stress.  Transcutaneous electrical nerve stimulation (TENS). This helps to relieve pain by applying an electrical current through the skin.  Acupuncture. This is sometimes helpful to relieve pain.  Jaw surgery. This is rarely needed. Follow these instructions at home:  Take medicines only as directed by your health care provider.  Eat a soft diet if you are having trouble chewing.  Apply ice to the painful area.  Put ice in a plastic bag.  Place a towel between your skin and the bag.  Leave the ice on for 20 minutes, 2-3 times a day.  Apply a warm compress to the painful area as directed.  Massage your jaw area and perform any jaw stretching exercises as recommended by your health care provider.  If you were given a mouthpiece or bite plate, wear it as directed.  Avoid foods that require a lot of chewing. Do not chew gum.  Keep all follow-up visits as directed by your health care provider. This is important. Contact a health care provider if:  You are having trouble eating.  You have new or worsening symptoms. Get help right away if:  Your jaw locks open or closed. This information is not intended to replace advice given to you by your health care provider. Make sure you discuss any questions you have with your health care provider. Document Released: 09/30/2000 Document  Revised: 09/05/2015 Document Reviewed: 08/10/2013 Elsevier Interactive Patient Education  2017 Elsevier Inc.  

## 2016-03-10 NOTE — Progress Notes (Signed)
Pre visit review using our clinic review tool, if applicable. No additional management support is needed unless otherwise documented below in the visit note. 

## 2016-04-01 ENCOUNTER — Encounter: Payer: Self-pay | Admitting: Cardiology

## 2016-04-01 ENCOUNTER — Ambulatory Visit (INDEPENDENT_AMBULATORY_CARE_PROVIDER_SITE_OTHER)
Admission: RE | Admit: 2016-04-01 | Discharge: 2016-04-01 | Disposition: A | Discharge status: Home / Self Care | Payer: Self-pay | Source: Ambulatory Visit | Attending: Cardiology | Admitting: Cardiology

## 2016-04-01 ENCOUNTER — Ambulatory Visit (INDEPENDENT_AMBULATORY_CARE_PROVIDER_SITE_OTHER): Payer: BLUE CROSS/BLUE SHIELD | Admitting: Cardiology

## 2016-04-01 VITALS — BP 140/80 | HR 95 | Ht 70.0 in | Wt 203.0 lb

## 2016-04-01 DIAGNOSIS — E1159 Type 2 diabetes mellitus with other circulatory complications: Secondary | ICD-10-CM | POA: Diagnosis not present

## 2016-04-01 DIAGNOSIS — Z8249 Family history of ischemic heart disease and other diseases of the circulatory system: Secondary | ICD-10-CM

## 2016-04-01 DIAGNOSIS — E7800 Pure hypercholesterolemia, unspecified: Secondary | ICD-10-CM

## 2016-04-01 DIAGNOSIS — G4733 Obstructive sleep apnea (adult) (pediatric): Secondary | ICD-10-CM

## 2016-04-01 DIAGNOSIS — R000 Tachycardia, unspecified: Secondary | ICD-10-CM | POA: Diagnosis not present

## 2016-04-01 DIAGNOSIS — I152 Hypertension secondary to endocrine disorders: Secondary | ICD-10-CM | POA: Insufficient documentation

## 2016-04-01 DIAGNOSIS — I10 Essential (primary) hypertension: Secondary | ICD-10-CM

## 2016-04-01 HISTORY — DX: Type 2 diabetes mellitus with other circulatory complications: E11.59

## 2016-04-01 HISTORY — DX: Hypertension secondary to endocrine disorders: I15.2

## 2016-04-01 LAB — BASIC METABOLIC PANEL
BUN/Creatinine Ratio: 16 (ref 9–20)
BUN: 16 mg/dL (ref 6–24)
CALCIUM: 9.2 mg/dL (ref 8.7–10.2)
CO2: 23 mmol/L (ref 18–29)
Chloride: 98 mmol/L (ref 96–106)
Creatinine, Ser: 1.02 mg/dL (ref 0.76–1.27)
GFR calc non Af Amer: 81 mL/min/{1.73_m2} (ref >59)
GFR, EST AFRICAN AMERICAN: 93 mL/min/{1.73_m2} (ref >59)
GLUCOSE: 125 mg/dL — AB (ref 65–99)
Potassium: 4.5 mmol/L (ref 3.5–5.2)
Sodium: 140 mmol/L (ref 134–144)

## 2016-04-01 LAB — HEPATIC FUNCTION PANEL
ALT: 40 IU/L (ref 0–44)
AST: 27 IU/L (ref 0–40)
Albumin: 4.8 g/dL (ref 3.5–5.5)
Alkaline Phosphatase: 55 IU/L (ref 39–117)
BILIRUBIN TOTAL: 0.7 mg/dL (ref 0.0–1.2)
Bilirubin, Direct: 0.22 mg/dL (ref 0.00–0.40)
TOTAL PROTEIN: 6.8 g/dL (ref 6.0–8.5)

## 2016-04-01 LAB — LIPID PANEL
Chol/HDL Ratio: 4.6 ratio units (ref 0.0–5.0)
Cholesterol, Total: 193 mg/dL (ref 100–199)
HDL: 42 mg/dL (ref >39)
LDL Calculated: 120 mg/dL — ABNORMAL HIGH (ref 0–99)
Triglycerides: 157 mg/dL — ABNORMAL HIGH (ref 0–149)
VLDL CHOLESTEROL CAL: 31 mg/dL (ref 5–40)

## 2016-04-01 MED ORDER — LISINOPRIL 10 MG PO TABS: 10.0000 mg | ORAL_TABLET | Freq: Every day | ORAL | 3 refills | Status: DC

## 2016-04-01 MED ORDER — ATORVASTATIN CALCIUM 80 MG PO TABS: 80.0000 mg | ORAL_TABLET | Freq: Every day | ORAL | 3 refills | Status: DC

## 2016-04-01 NOTE — Patient Instructions (Signed)
Medication Instructions:  Your physician recommends that you continue on your current medications as directed. Please refer to the Current Medication list given to you today.   Labwork: TODAY: BMET, LFTs, Lipids  Testing/Procedures: Dr. Radford Pax recommends you have a CALCIUM SCORE.  Follow-Up: Your physician wants you to follow-up in: 1 year with Dr. Radford Pax. You will receive a reminder letter in the mail two months in advance. If you don't receive a letter, please call our office to schedule the follow-up appointment.   Any Other Special Instructions Will Be Listed Below (If Applicable). We have ordered for you a chin strap for your PAP.   You may call our PAP assistant, Gae Bon, directly at [phone].    If you need a refill on your cardiac medications before your next appointment, please call your pharmacy.

## 2016-04-01 NOTE — Progress Notes (Signed)
Cardiology Office Note    Date:  04/01/2016   ID:  Brandon Frank, DOB November 12, 1955, MRN [id number]  PCP:  [name], MD  Cardiologist:  Fransico Him, MD   Chief Complaint  Patient presents with  . 12 month f/u  . Sleep Apnea  . Hypertension    History of Present Illness:  Brandon Frank is a 61 y.o. male with a history of sinus tachycardia and OSA. He has a history of severe sleep apnea and is on CPAP.  He does fairly well with his CPAP but is waking up with his mouth very dry.  He recently finished a course of antibx for diverticulitis and now is having nasal congestion.  He has been blowing a low of bloody secretions from his nose over the past few days.  He tolerates the nasal pillow mask without any problems.  He no longer snores if using the CPAP. He feels rested in the am and daytime sleepiness has improved significantly but occasionally has some sleepiness in the early afternoon and naps.  He thinks it is because he is now retired.  He has a history of dyslipidemia and strong family history of CAD and is on a statin and Zetia.  He denies any chest pain, SOB or DOE, LE edema, dizziness (except when standing up too fast, claudication or syncope.       Past Medical History:  Diagnosis Date  . Chronic low back pain   . Ejection fraction   . Family history of premature CAD 03/20/2015  . Hyperlipidemia   . Hypertension associated with diabetes ([place]) 04/01/2016  . Obstructive sleep apnea 12/06/2013   NPSG 11/14/13- Severe OSA, AHI 86/ hr with loud snore, desat ot 80%, titrated to CPAP 12, weight 215 lbs   . Sinus tachycardia 10/27/2013   Persistent mild sinus tachycardia, October, 2015   //   48 hour Holter, October, 2015, rare PACs and PVCs. Heart rate range 70-128, mean heart rate 95 during the daytime, mean heart rate 85 during the resting hours.   //   Patient was diagnosed with severe sleep apnea. C Pap was started and his resting sinus tachycardia improved greatly.      Past Surgical History:  Procedure Laterality Date  . HERNIA REPAIR    . PROSTATE BIOPSY N/A   . rotator cuff surgery  2012   Dr. [name]    Current Medications: Current Meds  Medication Sig  . ANDROGEL PUMP 20.25 MG/ACT (1.62%) GEL 1 pump to both upper arm area twice daily (total 4 pumps daily) per Dr [name]  . atorvastatin (LIPITOR) 80 MG tablet Take 1 tablet (80 mg total) by mouth daily.  . canagliflozin (INVOKANA) 300 MG TABS tablet TAKE ONE TABLET BY MOUTH ONCE DAILY BEFORE  BREAKFAST.  . DULoxetine (CYMBALTA) 60 MG capsule Take 3 capsules (180 mg total) by mouth daily. Per Dr Candis Schatz  . ezetimibe (ZETIA) 10 MG tablet Take 1 tablet (10 mg total) by mouth daily.  . fluticasone (FLONASE) 50 MCG/ACT nasal spray USE ONE SPRAY(S) IN EACH NOSTRIL ONCE DAILY AS NEEDED FOR  ALLERGIES  OR  RHINITIS  . glucosamine-chondroitin 500-400 MG tablet Take 1 tablet by mouth daily.  [name] glucose blood (BAYER CONTOUR NEXT TEST) test strip Use to test blood sugars daily. Dx: E11.9  . lisinopril (PRINIVIL,ZESTRIL) 10 MG tablet Take 1 tablet (10 mg total) by mouth daily.  [name] LORazepam (ATIVAN) 0.5 MG tablet Take 1 tablet (0.5 mg total) by mouth as  needed for anxiety.  . metFORMIN (GLUCOPHAGE) 500 MG tablet TAKE TWO TABLETS BY MOUTH TWICE DAILY WITH  A  MEAL  . Multiple Vitamin (MULTIVITAMIN) tablet Take 1 tablet by mouth daily.    . Sildenafil Citrate (VIAGRA PO) Take 5 mg by mouth daily as needed (Ed).  . zaleplon (SONATA) 5 MG capsule TAKE ONE CAPSULE BY MOUTH AT BEDTIME AS NEEDED FOR SLEEP  . zolpidem (AMBIEN) 10 MG tablet TAKE ONE TABLET BY MOUTH AT BEDTIME AS NEEDED FOR SLEEP    Allergies:   Patient has no known allergies.   Social History   Social History  . Marital status: Married    Spouse name: N/A  . Number of children: N/A  . Years of education: N/A   Social History Main Topics  . Smoking status: Former Smoker    Quit date: 03/20/1975  . Smokeless tobacco: Never Used  . Alcohol use  1.2 oz/week    2 Cans of beer per week     Comment: daily  . Drug use: No  . Sexual activity: Not Asked   Other Topics Concern  . None   Social History Narrative  . None     Family History:  The patient's family history includes Diabetes in his brother and paternal grandfather; Heart disease (age of onset: 80) in his father; Hyperlipidemia in his brother and father; Hypertension in his brother and father.   ROS:   Please see the history of present illness.    ROS All other systems reviewed and are negative.  No flowsheet data found.     PHYSICAL EXAM:   VS:  BP 140/80   Pulse 95   Ht 5\' 10"  (1.778 m)   Wt 203 lb (92.1 kg)   SpO2 98%   BMI 29.13 kg/m    GEN: Well nourished, well developed, in no acute distress  HEENT: normal  Neck: no JVD, carotid bruits, or masses Cardiac: RRR; no murmurs, rubs, or gallops,no edema.  Intact distal pulses bilaterally.  Respiratory:  clear to auscultation bilaterally, normal work of breathing GI: soft, nontender, nondistended, + BS MS: no deformity or atrophy  Skin: warm and dry, no rash Neuro:  Alert and Oriented x 3, Strength and sensation are intact Psych: euthymic mood, full affect  Wt Readings from Last 3 Encounters:  04/01/16 203 lb (92.1 kg)  03/10/16 202 lb (91.6 kg)  10/07/15 210 lb (95.3 kg)      Studies/Labs Reviewed:   EKG:  EKG is done today and showed NSR with no ST changes    Recent Labs: 10/01/2015: ALT 31; BUN 16; Creatinine, Ser 0.86; Hemoglobin 17.5; Platelets 160.0; Potassium 4.3; Sodium 136; TSH 1.27   Lipid Panel    Component Value Date/Time   CHOL 154 10/01/2015 1104   TRIG 95.0 10/01/2015 1104   TRIG 88 11/19/2005 1012   HDL 43.30 10/01/2015 1104   CHOLHDL 4 10/01/2015 1104   VLDL 19.0 10/01/2015 1104   LDLCALC 91 10/01/2015 1104   LDLDIRECT 168.2 10/29/2008 0813    Additional studies/ records that were reviewed today include:      ASSESSMENT:    1. Obstructive sleep apnea   2. Sinus  tachycardia   3. Pure hypercholesterolemia   4. Family history of premature CAD   5. Hypertension associated with diabetes ([place])      PLAN:  In order of problems listed above:  OSA - the patient is tolerating PAP therapy well without any problems. The PAP download was  reviewed today and showed an AHI of 0.7/hr on 12 cm H2O with 90% compliance in using more than 4 hours nightly.  The patient has been using and benefiting from CPAP use and will continue to benefit from therapy. I will get a chin strap since he is having problems with dry mouth in the am. Sinus tachycardia - heart rate controlled. Hyperlipidemia - he will continue on atorvastatin and Zetia and I will get an FLP and ALT.  4.   Family history of CAD - he had a stress test done a year ago that was normal.  Will repeat in a year for screening given family history. He will continue with risk factor modification including close watch on HTN, DM and lipids. I will get a baseline coronary calcium score done today to assess future risk.  5.   Hypertensive heart disease with Type II DM - BP controlled on current meds.  He will continue on ACE I.  DM is followed by his PCP.  Last HbA1C was 6.5.    Medication Adjustments/Labs and Tests Ordered: Current medicines are reviewed at length with the patient today.  Concerns regarding medicines are outlined above.  Medication changes, Labs and Tests ordered today are listed in the Patient Instructions below.  There are no Patient Instructions on file for this visit.   Signed, Fransico Him, MD  04/01/2016 8:47 AM    [facility], [place], [place]  [address] Phone: [phone]; Fax: [phone]

## 2016-04-02 ENCOUNTER — Telehealth: Payer: Self-pay

## 2016-04-02 DIAGNOSIS — R931 Abnormal findings on diagnostic imaging of heart and coronary circulation: Secondary | ICD-10-CM

## 2016-04-02 NOTE — Telephone Encounter (Signed)
Informed patient of results and verbal understanding expressed.  Stress myoview ordered for scheduling. Patient understands to fast 2 hours prior to test, to hold diabetic medications unless he can eat, and to come dressed prepared to exercise. He agrees with treatment plan.

## 2016-04-02 NOTE — Telephone Encounter (Signed)
-----   Message from Sueanne Margarita, MD sent at 04/01/2016  8:56 PM EDT ----- Markedly abnormal calcium score at 907 which is 97% for age and sex.  Please set up stress myoview to rule out ischemia.  He needs aggressive risk factor modification.

## 2016-04-03 ENCOUNTER — Other Ambulatory Visit: Payer: Self-pay | Admitting: *Deleted

## 2016-04-13 ENCOUNTER — Telehealth (HOSPITAL_COMMUNITY): Payer: Self-pay | Admitting: *Deleted

## 2016-04-13 NOTE — Telephone Encounter (Signed)
Left message on voicemail per DPR in reference to upcoming appointment scheduled on 04/15/16 at [facility] with detailed instructions given per Myocardial Perfusion Study Information Sheet for the test. LM to arrive 15 minutes early, and that it is imperative to arrive on time for appointment to keep from having the test rescheduled. If you need to cancel or reschedule your appointment, please call the office within 24 hours of your appointment. Failure to do so may result in a cancellation of your appointment, and a $50 no show fee. Phone number given for call back for any questions.

## 2016-04-14 ENCOUNTER — Telehealth: Payer: Self-pay | Admitting: *Deleted

## 2016-04-14 NOTE — Telephone Encounter (Signed)
Pt. called today asking about his chin strap that dr Radford Pax ordered on 04/02/16. I called [facility] spoke to [facility] who told me they needed his sleep study. I faxed the sleep study to [facility] at [phone] and notified the pt.    By Freada Bergeron, CMA

## 2016-04-15 ENCOUNTER — Ambulatory Visit (HOSPITAL_COMMUNITY): Payer: BLUE CROSS/BLUE SHIELD | Attending: Cardiovascular Disease

## 2016-04-15 ENCOUNTER — Ambulatory Visit (INDEPENDENT_AMBULATORY_CARE_PROVIDER_SITE_OTHER): Payer: BLUE CROSS/BLUE SHIELD | Admitting: Pharmacist

## 2016-04-15 DIAGNOSIS — I10 Essential (primary) hypertension: Secondary | ICD-10-CM | POA: Diagnosis not present

## 2016-04-15 DIAGNOSIS — Z8249 Family history of ischemic heart disease and other diseases of the circulatory system: Secondary | ICD-10-CM | POA: Diagnosis not present

## 2016-04-15 DIAGNOSIS — R931 Abnormal findings on diagnostic imaging of heart and coronary circulation: Secondary | ICD-10-CM | POA: Insufficient documentation

## 2016-04-15 DIAGNOSIS — E7800 Pure hypercholesterolemia, unspecified: Secondary | ICD-10-CM | POA: Diagnosis not present

## 2016-04-15 LAB — MYOCARDIAL PERFUSION IMAGING
CHL CUP MPHR: 162 {beats}/min
CHL CUP NUCLEAR SDS: 2
CHL CUP NUCLEAR SRS: 0
CHL CUP NUCLEAR SSS: 2
CHL CUP RESTING HR STRESS: 93 {beats}/min
CSEPED: 9 min
CSEPEW: 10.1 METS
CSEPHR: 95 %
Exercise duration (sec): 0 s
LHR: 0.29
LV dias vol: 94 mL (ref 62–150)
LV sys vol: 43 mL
Peak HR: 153 {beats}/min
TID: 1.01

## 2016-04-15 MED ORDER — TECHNETIUM TC 99M TETROFOSMIN IV KIT
10.4000 | PACK | Freq: Once | INTRAVENOUS | Status: AC | PRN
  Administered 2016-04-15: 10.4 via INTRAVENOUS
  Filled 2016-04-15: qty 11

## 2016-04-15 MED ORDER — TECHNETIUM TC 99M TETROFOSMIN IV KIT
32.5000 | PACK | Freq: Once | INTRAVENOUS | Status: AC | PRN
  Administered 2016-04-15: 32.5 via INTRAVENOUS
  Filled 2016-04-15: qty 33

## 2016-04-15 NOTE — Patient Instructions (Addendum)
I will submit paperwork for Praluent coverage for your cholesterol  Try to limit red meat and deli meat  Call Megan in lipid clinic with any questions

## 2016-04-15 NOTE — Progress Notes (Signed)
Patient ID: Brandon Frank                 DOB: 08-01-1955                    MRN: [id number]     HPI: Brandon Frank is a 61 y.o. male patient referred to lipid clinic by Dr Radford Pax. PMH is significant for HLD, HTN, DM, family history of premature CAD, and sleep apnea on CPAP. Pt underwent coronary calcium scoring which yielded a coronary calcium score of 907 which was 97th percentile for age and sex and consistent with CAD and 3 vessel coronary calcifications. He currently takes Lipitor 80mg  and Zetia 10mg  daily with LDL 120 above goal <70 given new diagnosis of CAD.  Pt reports tolerating Lipitor and Zetia well. He has a strong family history of CAD. His father developed heart disease at age 86. Pt's LDL on max dose of Lipitor and Zetia is still 120 which means his baseline LDL is likely > 250. He states he has been on these therapies for over 20 years. Despite this treatment, his recent coronary calcium score was very elevated > 900 indicative of CAD. Pt requires further LDL lowering to prevent future ASCVD events. 10 year ASCVD risk is 15.9% despite high intensity statin therapy.  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 He reports losing 15 lbs when he was there since his wife cooked most of his meals. He also retired 1 year ago and has a lot less stress in his life now.  Current Medications: Lipitor 80mg  daily, Zetia 10mg  dailiy Intolerances: none Risk Factors: CAD, DM, family history LDL goal: 70mg /dL  Diet: Yogurt, muffin, or eggs for breakfast. Breakfast - [place] sub. Meat and potatoes for dinner. Does eat a salad every day. Has worked with a Automotive engineer previously. Has a few beers every night.  Exercise: Plays soccer each week and gardens  Family History: The patient's family history includes Diabetes in his brother and paternal grandfather; Heart disease (age of onset: 22) in his father; Hyperlipidemia in his brother and father; Hypertension in  his brother and father.   Social History: Former smoker, quit in 1977. Occasionally drinks alcohol, denies illicit drug use.  Labs: 04/01/16: TC 193, TG 157, HDL 42, LDL 120 (atorvastatin 80mg  daily, Zetia 10mg  daily)  Past Medical History:  Diagnosis Date  . Chronic low back pain   . Ejection fraction   . Family history of premature CAD 03/20/2015  . Hyperlipidemia   . Hypertension associated with diabetes ([place]) 04/01/2016  . Obstructive sleep apnea 12/06/2013   NPSG 11/14/13- Severe OSA, AHI 86/ hr with loud snore, desat ot 80%, titrated to CPAP 12, weight 215 lbs   . Sinus tachycardia 10/27/2013   Persistent mild sinus tachycardia, October, 2015   //   48 hour Holter, October, 2015, rare PACs and PVCs. Heart rate range 70-128, mean heart rate 95 during the daytime, mean heart rate 85 during the resting hours.   //   Patient was diagnosed with severe sleep apnea. C Pap was started and his resting sinus tachycardia improved greatly.     Current Outpatient Prescriptions on File Prior to Visit  Medication Sig Dispense Refill  . ANDROGEL PUMP 20.25 MG/ACT (1.62%) GEL 1 pump to both upper arm area twice daily (total 4 pumps daily) per Dr [name]    . atorvastatin (LIPITOR) 80 MG tablet Take 1 tablet (80 mg  total) by mouth daily. 90 tablet 3  . canagliflozin (INVOKANA) 300 MG TABS tablet TAKE ONE TABLET BY MOUTH ONCE DAILY BEFORE  BREAKFAST. 90 tablet 2  . DULoxetine (CYMBALTA) 60 MG capsule Take 3 capsules (180 mg total) by mouth daily. Per Dr Candis Schatz 90 capsule 1  . ezetimibe (ZETIA) 10 MG tablet Take 1 tablet (10 mg total) by mouth daily. 90 tablet 2  . fluticasone (FLONASE) 50 MCG/ACT nasal spray USE ONE SPRAY(S) IN EACH NOSTRIL ONCE DAILY AS NEEDED FOR  ALLERGIES  OR  RHINITIS 16 g 2  . glucosamine-chondroitin 500-400 MG tablet Take 1 tablet by mouth daily. 90 tablet 2  . glucose blood (BAYER CONTOUR NEXT TEST) test strip Use to test blood sugars daily. Dx: E11.9 100 each 12  . lisinopril  (PRINIVIL,ZESTRIL) 10 MG tablet Take 1 tablet (10 mg total) by mouth daily. 90 tablet 3  . LORazepam (ATIVAN) 0.5 MG tablet Take 1 tablet (0.5 mg total) by mouth as needed for anxiety. 30 tablet 5  . metFORMIN (GLUCOPHAGE) 500 MG tablet TAKE TWO TABLETS BY MOUTH TWICE DAILY WITH  A  MEAL 360 tablet 1  . Multiple Vitamin (MULTIVITAMIN) tablet Take 1 tablet by mouth daily.      . Sildenafil Citrate (VIAGRA PO) Take 5 mg by mouth daily as needed (Ed).    . zaleplon (SONATA) 5 MG capsule TAKE ONE CAPSULE BY MOUTH AT BEDTIME AS NEEDED FOR SLEEP 30 capsule 5  . zolpidem (AMBIEN) 10 MG tablet TAKE ONE TABLET BY MOUTH AT BEDTIME AS NEEDED FOR SLEEP 30 tablet 2  . [DISCONTINUED] paroxetine mesylate (PEXEVA) 20 MG tablet Take 1 tablet (20 mg total) by mouth every morning. 60 tablet 3   No current facility-administered medications on file prior to visit.     No Known Allergies  Assessment/Plan:  1. Hyperlipidemia - LDL 120mg /dL on Lipitor 80mg  and Zetia 10mg  daily for above goal 70mg /dL given diagnosis of CAD and elevated coronary calcium score of 907. Pt also at additional risk given family history of CAD, relatively new diagnosis of DM, and 10 year ASCVD risk of 15.9% even on current lipid lowering therapy. Pt needs PCSK9i to bring LDL to goal. Discussed expected benefits, side effects, and injection technique. Will submit prior authorization for Praluent therapy.   Megan E. Supple, PharmD, CPP, [facility] [address], [place], [place] [address] Phone: [phone]; Fax: [phone] 04/15/2016 11:19 AM

## 2016-04-28 ENCOUNTER — Encounter: Payer: Self-pay | Admitting: Family Medicine

## 2016-04-28 ENCOUNTER — Ambulatory Visit (INDEPENDENT_AMBULATORY_CARE_PROVIDER_SITE_OTHER): Payer: BLUE CROSS/BLUE SHIELD | Admitting: Family Medicine

## 2016-04-28 VITALS — BP 140/70 | HR 111 | Temp 98.3°F | Wt 205.4 lb

## 2016-04-28 DIAGNOSIS — J019 Acute sinusitis, unspecified: Secondary | ICD-10-CM | POA: Diagnosis not present

## 2016-04-28 DIAGNOSIS — E119 Type 2 diabetes mellitus without complications: Secondary | ICD-10-CM | POA: Diagnosis not present

## 2016-04-28 LAB — POCT GLYCOSYLATED HEMOGLOBIN (HGB A1C): Hemoglobin A1C: 7.4

## 2016-04-28 MED ORDER — AMOXICILLIN-POT CLAVULANATE 875-125 MG PO TABS: 1.0000 | ORAL_TABLET | Freq: Two times a day (BID) | ORAL | 0 refills | Status: DC

## 2016-04-28 NOTE — Progress Notes (Signed)
Pre visit review using our clinic review tool, if applicable. No additional management support is needed unless otherwise documented below in the visit note. 

## 2016-04-28 NOTE — Patient Instructions (Signed)

## 2016-04-28 NOTE — Progress Notes (Signed)
Subjective:     Patient ID: Brandon Frank, male   DOB: 1955-04-08, 61 y.o.   MRN: [id number]  HPI Patient seen for acute problem and chronic follow-up of diabetes  He states he's had about one month history of some colored nasal discharge and bloody discharge intermittently. Increased fatigue. Occasional headaches. Some sinus pressure. No relief with hydration and over-the-counter medications  Patient had recent coronary calcium score with very high score and ended up having nuclear stress test which came back with no ischemia. No recent chest pains.  Type 2 diabetes. History of good control. He's had some elevation blood sugars recently in recent weeks and last A1c was about 6 months ago. No polyuria or polydipsia  Past Medical History:  Diagnosis Date  . Chronic low back pain   . Ejection fraction   . Family history of premature CAD 03/20/2015  . Hyperlipidemia   . Hypertension associated with diabetes ([place]) 04/01/2016  . Obstructive sleep apnea 12/06/2013   NPSG 11/14/13- Severe OSA, AHI 86/ hr with loud snore, desat ot 80%, titrated to CPAP 12, weight 215 lbs   . Sinus tachycardia 10/27/2013   Persistent mild sinus tachycardia, October, 2015   //   48 hour Holter, October, 2015, rare PACs and PVCs. Heart rate range 70-128, mean heart rate 95 during the daytime, mean heart rate 85 during the resting hours.   //   Patient was diagnosed with severe sleep apnea. C Pap was started and his resting sinus tachycardia improved greatly.    Past Surgical History:  Procedure Laterality Date  . HERNIA REPAIR    . PROSTATE BIOPSY N/A   . rotator cuff surgery  2012   Dr. [name]    reports that he quit smoking about 41 years ago. He has never used smokeless tobacco. He reports that he drinks about 1.2 oz of alcohol per week . He reports that he does not use drugs. family history includes Diabetes in his brother and paternal grandfather; Heart disease (age of onset: 17) in his father; Hyperlipidemia  in his brother and father; Hypertension in his brother and father. No Known Allergies   Review of Systems  Constitutional: Positive for fatigue. Negative for chills and fever.  HENT: Positive for congestion, sinus pain and sinus pressure.   Respiratory: Negative for shortness of breath.   Cardiovascular: Negative for chest pain.  Gastrointestinal: Negative for abdominal pain.  Endocrine: Negative for polydipsia and polyuria.  Genitourinary: Negative for dysuria.       Objective:   Physical Exam  Constitutional: He appears well-developed and well-nourished.  HENT:  Right Ear: External ear normal.  Left Ear: External ear normal.  Mouth/Throat: Oropharynx is clear and moist.  Neck: Neck supple.  Cardiovascular: Normal rate and regular rhythm.   Pulmonary/Chest: Effort normal and breath sounds normal. No respiratory distress. He has no wheezes. He has no rales.       Assessment:     #1 acute pansinusitis  #2 type 2 diabetes with history of good control    Plan:     -Recheck hemoglobin A1c=7.4% -Augmentin 875 mg twice daily for 10 days -tighten up diet and follow up in 3 months. -If A1C not closer to goal by then will look at other additions-?DPP-4 vs GLP-1 class.  Pt already on Metformin and Invokana.  [name] MD [facility]

## 2016-05-05 ENCOUNTER — Ambulatory Visit: Payer: BLUE CROSS/BLUE SHIELD | Admitting: Family Medicine

## 2016-05-08 ENCOUNTER — Encounter: Payer: Self-pay | Admitting: Family Medicine

## 2016-05-08 ENCOUNTER — Telehealth: Payer: Self-pay | Admitting: Family Medicine

## 2016-05-08 ENCOUNTER — Ambulatory Visit (INDEPENDENT_AMBULATORY_CARE_PROVIDER_SITE_OTHER): Payer: BLUE CROSS/BLUE SHIELD | Admitting: Family Medicine

## 2016-05-08 VITALS — BP 120/70 | HR 106 | Temp 98.1°F | Wt 206.6 lb

## 2016-05-08 DIAGNOSIS — J0141 Acute recurrent pansinusitis: Secondary | ICD-10-CM | POA: Diagnosis not present

## 2016-05-08 MED ORDER — LEVOFLOXACIN 500 MG PO TABS: 500.0000 mg | ORAL_TABLET | Freq: Every day | ORAL | 0 refills | Status: DC

## 2016-05-08 NOTE — Progress Notes (Signed)
Subjective:     Patient ID: Brandon Frank, male   DOB: 1955-10-22, 61 y.o.   MRN: [id number]  HPI Pt seen with recurrent/persistent sinusitis symptoms.  He just finished 10 day course of Augmentin for pansinusitis symptoms.  He has some fatigue, headache, nasal congestion, pressure in frontal and maxillary regions bilateral.  Some bloody and greenish nasal discharge. Rare cough.  Headaches worse in AM.   No known drug allergies.  Does not suffer any seasonal allergies.  Past Medical History:  Diagnosis Date  . Chronic low back pain   . Ejection fraction   . Family history of premature CAD 03/20/2015  . Hyperlipidemia   . Hypertension associated with diabetes ([place]) 04/01/2016  . Obstructive sleep apnea 12/06/2013   NPSG 11/14/13- Severe OSA, AHI 86/ hr with loud snore, desat ot 80%, titrated to CPAP 12, weight 215 lbs   . Sinus tachycardia 10/27/2013   Persistent mild sinus tachycardia, October, 2015   //   48 hour Holter, October, 2015, rare PACs and PVCs. Heart rate range 70-128, mean heart rate 95 during the daytime, mean heart rate 85 during the resting hours.   //   Patient was diagnosed with severe sleep apnea. C Pap was started and his resting sinus tachycardia improved greatly.    Past Surgical History:  Procedure Laterality Date  . HERNIA REPAIR    . PROSTATE BIOPSY N/A   . rotator cuff surgery  2012   Dr. [name]    reports that he quit smoking about 41 years ago. He has never used smokeless tobacco. He reports that he drinks about 1.2 oz of alcohol per week . He reports that he does not use drugs. family history includes Diabetes in his brother and paternal grandfather; Heart disease (age of onset: 70) in his father; Hyperlipidemia in his brother and father; Hypertension in his brother and father. No Known Allergies   Review of Systems  Constitutional: Positive for fatigue. Negative for chills and fever.  HENT: Positive for congestion, sinus pain and sinus pressure.    Respiratory: Negative for shortness of breath.   Neurological: Positive for headaches.       Objective:   Physical Exam  Constitutional: He appears well-developed and well-nourished.  HENT:  Right Ear: External ear normal.  Left Ear: External ear normal.  Mouth/Throat: Oropharynx is clear and moist.  Erythematous nasal mucosa bilateral.  Neck: Neck supple.  Cardiovascular: Normal rate and regular rhythm.   Pulmonary/Chest: Effort normal and breath sounds normal. No respiratory distress. He has no wheezes. He has no rales.       Assessment:     Recurrent/persistent pansinusitis not much improved on Augmentin.     Plan:     -stay well hydrated. -Levaquin 500 mg po qd for 10 days -consider limited CT of sinuses if no better in 2 weeks.  [name] MD [facility] at [facility]

## 2016-05-08 NOTE — Telephone Encounter (Signed)
Patient Name: Brandon Frank DOB: 09/04/1955 Initial Comment Caller states was in about 10 days ago for sinus infection; RXd amoxicillin; was a bit better but now back to where he was before. Nurse Assessment Nurse: Cherie Dark RN, Jarrett Soho Date/Time (Eastern Time): 05/08/2016 8:22:59 AM Confirm and document reason for call. If symptomatic, describe symptoms. ---Caller states he was in the office about 10 days ago and given Amoxicillin for a sinus infection. Felt better for a few days but now is wore again. More sinus pain, and yellow, green and blood tinged discharge. No fever Does the patient have any new or worsening symptoms? ---Yes Will a triage be completed? ---Yes Related visit to physician within the last 2 weeks? ---Yes Does the PT have any chronic conditions? (i.e. diabetes, asthma, etc.) ---Yes List chronic conditions. ---diabetes, Is this a behavioral health or substance abuse call? ---No Guidelines Guideline Title Affirmed Question Affirmed Notes Sinus Infection on Antibiotic Follow-up Call [1] Taking antibiotic > 72 hours (3 days) AND [2] sinus pain not improved Final Disposition User See Physician within 24 Hours [place], RN, Jarrett Soho Comments Appt scheduled for today at 3:45p with Dr. Elease Hashimoto Referrals REFERRED TO PCP OFFICE Disagree/Comply: Comply

## 2016-05-08 NOTE — Patient Instructions (Signed)
Let me know if not better following this round of antibiotic.

## 2016-05-08 NOTE — Telephone Encounter (Signed)
Noted.  Pt scheduled.  No further action needed.  Will close note.

## 2016-05-08 NOTE — Progress Notes (Signed)
Pre visit review using our clinic review tool, if applicable. No additional management support is needed unless otherwise documented below in the visit note. 

## 2016-05-24 ENCOUNTER — Encounter: Payer: Self-pay | Admitting: Cardiology

## 2016-05-25 ENCOUNTER — Telehealth: Payer: Self-pay | Admitting: *Deleted

## 2016-05-25 NOTE — Telephone Encounter (Addendum)
Called the patient to follow up on his chin strap for his cpap machine and he informed me that he got his chin strap about 2 weeks ago wore the strap a couple of days and it has made no difference. He states he will try it on again. Patient was very dissatisfied with [employer] and their customer service. I pulled a 2 week down load on his machine and sent it to be scanned.

## 2016-06-21 ENCOUNTER — Other Ambulatory Visit: Payer: Self-pay | Admitting: Internal Medicine

## 2016-06-22 NOTE — Telephone Encounter (Signed)
CY Please advise on refill. Thanks.  

## 2016-06-22 NOTE — Telephone Encounter (Signed)
Ok to refill total 6 months 

## 2016-06-23 ENCOUNTER — Other Ambulatory Visit: Payer: Self-pay | Admitting: Internal Medicine

## 2016-07-09 ENCOUNTER — Telehealth: Payer: Self-pay | Admitting: Cardiology

## 2016-07-09 NOTE — Telephone Encounter (Signed)
New message    Loletha Grayer from [facility] is calling for [employer]. He said it's about an appeal. Please call.

## 2016-07-13 ENCOUNTER — Other Ambulatory Visit: Payer: Self-pay | Admitting: Family Medicine

## 2016-07-13 NOTE — Telephone Encounter (Signed)
Have LMOM x2 for [facility] from [place].

## 2016-07-17 NOTE — Telephone Encounter (Signed)
Have called and LMOM 4 times in total, still no response back. Have already reported pt's plan to [facility] Dept of Insurance for inappropriate denial of Praluent therapy. Still waiting to hear back.

## 2016-07-21 NOTE — Telephone Encounter (Signed)
Have submitted an external appeals request with the [place] Dept of Insurance. Should have a decision back within 45 days per Wyatt Portela, representative with dept of insurance.

## 2016-07-28 ENCOUNTER — Ambulatory Visit (INDEPENDENT_AMBULATORY_CARE_PROVIDER_SITE_OTHER): Payer: BLUE CROSS/BLUE SHIELD | Admitting: Family Medicine

## 2016-07-28 ENCOUNTER — Encounter: Payer: Self-pay | Admitting: Family Medicine

## 2016-07-28 DIAGNOSIS — E119 Type 2 diabetes mellitus without complications: Secondary | ICD-10-CM

## 2016-07-28 LAB — POCT GLYCOSYLATED HEMOGLOBIN (HGB A1C): HEMOGLOBIN A1C: 7.2

## 2016-07-28 MED ORDER — GLUCOSE BLOOD VI STRP: ORAL_STRIP | 12 refills | Status: AC

## 2016-07-28 MED ORDER — ONETOUCH ULTRASOFT LANCETS MISC: 12 refills | Status: AC

## 2016-07-28 NOTE — Progress Notes (Signed)
Subjective:     Patient ID: Brandon Frank, male   DOB: 1955/07/01, 61 y.o.   MRN: [id number]  HPI  Patient here for follow-up type 2 diabetes. Last A1c was 7.4%. He has unfortunately gained some weight back. His fasting blood sugars have been consistently around 150. He is currently on 3 drug regimen of metformin, invokana, and trulicity. Denies any side effects from medications. Has been fairly consistent with exercise. No recent chest pains or complaints.  Past Medical History:  Diagnosis Date  . Chronic low back pain   . Ejection fraction   . Family history of premature CAD 03/20/2015  . Hyperlipidemia   . Hypertension associated with diabetes ([place]) 04/01/2016  . Obstructive sleep apnea 12/06/2013   NPSG 11/14/13- Severe OSA, AHI 86/ hr with loud snore, desat ot 80%, titrated to CPAP 12, weight 215 lbs   . Sinus tachycardia 10/27/2013   Persistent mild sinus tachycardia, October, 2015   //   48 hour Holter, October, 2015, rare PACs and PVCs. Heart rate range 70-128, mean heart rate 95 during the daytime, mean heart rate 85 during the resting hours.   //   Patient was diagnosed with severe sleep apnea. C Pap was started and his resting sinus tachycardia improved greatly.    Past Surgical History:  Procedure Laterality Date  . HERNIA REPAIR    . PROSTATE BIOPSY N/A   . rotator cuff surgery  2012   Dr. [name]    reports that he quit smoking about 41 years ago. He has never used smokeless tobacco. He reports that he drinks about 1.2 oz of alcohol per week . He reports that he does not use drugs. family history includes Diabetes in his brother and paternal grandfather; Heart disease (age of onset: 51) in his father; Hyperlipidemia in his brother and father; Hypertension in his brother and father. No Known Allergies  Review of Systems  Constitutional: Negative for fatigue and unexpected weight change.  Eyes: Negative for visual disturbance.  Respiratory: Negative for cough, chest  tightness and shortness of breath.   Cardiovascular: Negative for chest pain, palpitations and leg swelling.  Endocrine: Negative for polydipsia and polyuria.  Neurological: Negative for dizziness, syncope, weakness, light-headedness and headaches.       Objective:   Physical Exam  Constitutional: He is oriented to person, place, and time. He appears well-developed and well-nourished.  HENT:  Right Ear: External ear normal.  Left Ear: External ear normal.  Mouth/Throat: Oropharynx is clear and moist.  Eyes: Pupils are equal, round, and reactive to light.  Neck: Neck supple. No thyromegaly present.  Cardiovascular: Normal rate and regular rhythm.   Pulmonary/Chest: Effort normal and breath sounds normal. No respiratory distress. He has no wheezes. He has no rales.  Musculoskeletal: He exhibits no edema.  Neurological: He is alert and oriented to person, place, and time.       Assessment:     Type 2 diabetes. Repeat A1c today 7.2% which is slightly improved    Plan:     -We reviewed options including further weight loss and tighten up diet and rechecking A1c in 3 months versus additional medication and he prefers the former. -Routine follow-up in 3 months. If A1c not below 7 at that point consider additional medication-possibly DPP- 4 class  [name] MD [facility] at [facility]

## 2016-08-14 ENCOUNTER — Telehealth: Payer: Self-pay | Admitting: Pharmacist

## 2016-08-14 MED ORDER — ALIROCUMAB 150 MG/ML ~~LOC~~ SOPN: 1.0000 "pen " | PEN_INJECTOR | SUBCUTANEOUS | 11 refills | Status: DC

## 2016-08-14 NOTE — Telephone Encounter (Signed)
Finally received notice that external appeal was approved for Praluent after initiating Praluent coverage process in March. Pt has been made aware and rx sent to [facility] in [place]. Pt will call with any concerns and when he receives his first shipment so we can coordinate lab work.

## 2016-08-19 ENCOUNTER — Other Ambulatory Visit: Payer: Self-pay | Admitting: Family Medicine

## 2016-08-24 ENCOUNTER — Ambulatory Visit (INDEPENDENT_AMBULATORY_CARE_PROVIDER_SITE_OTHER): Payer: BLUE CROSS/BLUE SHIELD | Admitting: Family Medicine

## 2016-08-24 ENCOUNTER — Encounter: Payer: Self-pay | Admitting: Family Medicine

## 2016-08-24 VITALS — BP 130/70 | HR 110 | Temp 98.3°F | Wt 209.3 lb

## 2016-08-24 DIAGNOSIS — R300 Dysuria: Secondary | ICD-10-CM | POA: Diagnosis not present

## 2016-08-24 LAB — POCT URINALYSIS DIPSTICK
BILIRUBIN UA: NEGATIVE
KETONES UA: NEGATIVE
Leukocytes, UA: NEGATIVE
Nitrite, UA: NEGATIVE
Protein, UA: 0.2
RBC UA: NEGATIVE
SPEC GRAV UA: 1.01 (ref 1.010–1.025)
UROBILINOGEN UA: 0.2 U/dL
pH, UA: 6.5 (ref 5.0–8.0)

## 2016-08-24 NOTE — Progress Notes (Signed)
Subjective:     Patient ID: Brandon Frank, male   DOB: [birth year], 61 y.o.   MRN: [id number]  HPI Patient seen with concern for possible UTI. He states that his recent history is that he had toothache last week following a root canal. He was given 3 days of amoxicillin from his dentist and then had some recurrent left facial swelling was called in another several days of amoxicillin. Couple days ago had some frequent urination and mild burning with urination but those symptoms have fully resolved. He has now off amoxicillin. No fevers or chills. No flank pain. No nausea or vomiting. Denies prior history of UTI. Does have type 2 diabetes which is been fairly well controlled. Last A1c 7.2%.  His symptoms are improved today but his major concern is that he is getting ready to fly to [place] tomorrow.  Past Medical History:  Diagnosis Date  . Chronic low back pain   . Ejection fraction   . Family history of premature CAD 03/20/2015  . Hyperlipidemia   . Hypertension associated with diabetes ([place]) 04/01/2016  . Obstructive sleep apnea 12/06/2013   NPSG 11/14/13- Severe OSA, AHI 86/ hr with loud snore, desat ot 80%, titrated to CPAP 12, weight 215 lbs   . Sinus tachycardia 10/27/2013   Persistent mild sinus tachycardia, October, 2015   //   48 hour Holter, October, 2015, rare PACs and PVCs. Heart rate range 70-128, mean heart rate 95 during the daytime, mean heart rate 85 during the resting hours.   //   Patient was diagnosed with severe sleep apnea. C Pap was started and his resting sinus tachycardia improved greatly.    Past Surgical History:  Procedure Laterality Date  . HERNIA REPAIR    . PROSTATE BIOPSY N/A   . rotator cuff surgery  2012   Dr. [name]    reports that he quit smoking about 41 years ago. He has never used smokeless tobacco. He reports that he drinks about 1.2 oz of alcohol per week . He reports that he does not use drugs. family history includes Diabetes in his brother and  paternal grandfather; Heart disease (age of onset: 106) in his father; Hyperlipidemia in his brother and father; Hypertension in his brother and father. No Known Allergies   Review of Systems  Constitutional: Negative for chills and fever.  Respiratory: Negative for cough.   Gastrointestinal: Negative for abdominal pain, nausea and vomiting.  Genitourinary: Positive for frequency. Negative for hematuria.       Objective:   Physical Exam  Constitutional: He appears well-developed and well-nourished.  Cardiovascular: Normal rate and regular rhythm.   Pulmonary/Chest: Effort normal and breath sounds normal. No respiratory distress. He has no wheezes. He has no rales.       Assessment:     Recent dysuria resolving with urine dipstick today revealing only glucose but no evidence for infection    Plan:     -Reassurance. -No indication for additional antibiotics at this time -Follow-up for any recurrent urinary symptoms, fever, or other concerns  [name] MD  Primary Care at [facility]

## 2016-08-24 NOTE — Patient Instructions (Signed)
Your urine was clear -with the exception of some glucose Stay well hydrated. Don't see the need for any additional antibiotics.

## 2016-09-25 ENCOUNTER — Telehealth: Payer: Self-pay | Admitting: Pharmacist

## 2016-09-25 DIAGNOSIS — E785 Hyperlipidemia, unspecified: Secondary | ICD-10-CM

## 2016-09-25 NOTE — Telephone Encounter (Signed)
Called pt to see if he had received his Praluent yet. He confirms that he has and that he has done 2 injections so far and is overall feeling well. He does have a pain in his back but it is not muscular and he does not think it is related to his Praluent - he is planning on following up with his MD this week.  Scheduled f/u labs on 9/20 to assess efficacy of Praluent.

## 2016-09-30 ENCOUNTER — Other Ambulatory Visit: Payer: BLUE CROSS/BLUE SHIELD

## 2016-10-07 ENCOUNTER — Ambulatory Visit (INDEPENDENT_AMBULATORY_CARE_PROVIDER_SITE_OTHER): Payer: BLUE CROSS/BLUE SHIELD | Admitting: Family Medicine

## 2016-10-07 ENCOUNTER — Encounter: Payer: Self-pay | Admitting: Family Medicine

## 2016-10-07 VITALS — BP 120/80 | HR 94 | Temp 98.9°F | Ht 70.0 in | Wt 207.1 lb

## 2016-10-07 DIAGNOSIS — Z23 Encounter for immunization: Secondary | ICD-10-CM

## 2016-10-07 DIAGNOSIS — E119 Type 2 diabetes mellitus without complications: Secondary | ICD-10-CM | POA: Diagnosis not present

## 2016-10-07 DIAGNOSIS — I10 Essential (primary) hypertension: Secondary | ICD-10-CM

## 2016-10-07 DIAGNOSIS — G609 Hereditary and idiopathic neuropathy, unspecified: Secondary | ICD-10-CM

## 2016-10-07 DIAGNOSIS — E7800 Pure hypercholesterolemia, unspecified: Secondary | ICD-10-CM

## 2016-10-07 DIAGNOSIS — Z1159 Encounter for screening for other viral diseases: Secondary | ICD-10-CM

## 2016-10-07 DIAGNOSIS — I152 Hypertension secondary to endocrine disorders: Secondary | ICD-10-CM

## 2016-10-07 DIAGNOSIS — E1159 Type 2 diabetes mellitus with other circulatory complications: Secondary | ICD-10-CM | POA: Diagnosis not present

## 2016-10-07 MED ORDER — EMPAGLIFLOZIN 10 MG PO TABS: 10.0000 mg | ORAL_TABLET | Freq: Every day | ORAL | 3 refills | Status: DC

## 2016-10-07 NOTE — Patient Instructions (Signed)
See if we can get Jardiance covered in place of Invokanna. Let's plan repeat A1c in one month. Hopefully, we can get B12 and hep c antibody done tomorrow with lab draw.

## 2016-10-07 NOTE — Progress Notes (Signed)
Subjective:     Patient ID: Brandon Frank, male   DOB: 1955/12/20, 61 y.o.   MRN: [id number]  HPI Patient has multiple chronic problems include history of hypertension, type 2 diabetes, obstructive sleep apnea, peripheral neuropathy, hyperlipidemia. He had coronary calcium scan with extremely high score. His lipids have not been to goal in spite of high-dose Lipitor. Cardiology clinic has recommended starting PCSK-9 medication.  Type 2 diabetes. A1c 7.2% 2 months ago. He is triying to lose some weight. Takes invokana and metformin.  Hypertension well controlled.  History of peripheral neuropathy. Probably related to diabetes. He has been on metformin chronically and has not had B12 level checked. Recent TSH year ago normal.  Past Medical History:  Diagnosis Date  . Chronic low back pain   . Ejection fraction   . Family history of premature CAD 03/20/2015  . Hyperlipidemia   . Hypertension associated with diabetes ([place]) 04/01/2016  . Obstructive sleep apnea 12/06/2013   NPSG 11/14/13- Severe OSA, AHI 86/ hr with loud snore, desat ot 80%, titrated to CPAP 12, weight 215 lbs   . Sinus tachycardia 10/27/2013   Persistent mild sinus tachycardia, October, 2015   //   48 hour Holter, October, 2015, rare PACs and PVCs. Heart rate range 70-128, mean heart rate 95 during the daytime, mean heart rate 85 during the resting hours.   //   Patient was diagnosed with severe sleep apnea. C Pap was started and his resting sinus tachycardia improved greatly.    Past Surgical History:  Procedure Laterality Date  . HERNIA REPAIR    . PROSTATE BIOPSY N/A   . rotator cuff surgery  2012   Dr. [name]    reports that he quit smoking about 41 years ago. He has never used smokeless tobacco. He reports that he drinks about 1.2 oz of alcohol per week . He reports that he does not use drugs. family history includes Diabetes in his brother and paternal grandfather; Heart disease (age of onset: 46) in his father;  Hyperlipidemia in his brother and father; Hypertension in his brother and father. No Known Allergies   Review of Systems  Constitutional: Negative for fatigue.  Eyes: Negative for visual disturbance.  Respiratory: Negative for cough, chest tightness and shortness of breath.   Cardiovascular: Negative for chest pain, palpitations and leg swelling.  Neurological: Negative for dizziness, syncope, weakness, light-headedness and headaches.       Objective:   Physical Exam  Constitutional: He is oriented to person, place, and time. He appears well-developed and well-nourished. No distress.  HENT:  Head: Normocephalic and atraumatic.  Right Ear: External ear normal.  Left Ear: External ear normal.  Mouth/Throat: Oropharynx is clear and moist.  Eyes: Pupils are equal, round, and reactive to light. Conjunctivae and EOM are normal.  Neck: Normal range of motion. Neck supple. No thyromegaly present.  Cardiovascular: Normal rate, regular rhythm and normal heart sounds.   No murmur heard. Pulmonary/Chest: No respiratory distress. He has no wheezes. He has no rales.  Abdominal: Soft. Bowel sounds are normal. He exhibits no distension and no mass. There is no tenderness. There is no rebound and no guarding.  Musculoskeletal: He exhibits no edema.  Lymphadenopathy:    He has no cervical adenopathy.  Neurological: He is alert and oriented to person, place, and time. He displays normal reflexes. No cranial nerve deficit.  Skin: No rash noted.  Feet good capillary refill. He has impairment with monofilament testing dorsum of several toes  especially great toe. Intact sensation on the volar aspect of both feet No calluses. No ulcerations.  Psychiatric: He has a normal mood and affect.       Assessment:     #1 type 2 diabetes. Marginal control with recent A1c 7.2%  #2 hypertension well controlled  #3 dyslipidemia  #4 peripheral neuropathy    Plan:     -Check hepatitis C and B12 level.  We'll plan repeat A1c in one month -We'll try to see if we get coverage for him to be on Jardiance given cardiovascular prevention trial data in place of invokana -Continue close follow-up with cardiology lipid clinic -Flu vaccine given  Brandon Post MD [facility] at [facility]

## 2016-10-08 ENCOUNTER — Encounter: Payer: Self-pay | Admitting: Family Medicine

## 2016-10-08 ENCOUNTER — Other Ambulatory Visit: Payer: BLUE CROSS/BLUE SHIELD | Admitting: *Deleted

## 2016-10-08 DIAGNOSIS — E119 Type 2 diabetes mellitus without complications: Secondary | ICD-10-CM

## 2016-10-08 DIAGNOSIS — Z1159 Encounter for screening for other viral diseases: Secondary | ICD-10-CM

## 2016-10-08 DIAGNOSIS — G609 Hereditary and idiopathic neuropathy, unspecified: Secondary | ICD-10-CM

## 2016-10-08 DIAGNOSIS — E785 Hyperlipidemia, unspecified: Secondary | ICD-10-CM

## 2016-10-08 LAB — HEPATIC FUNCTION PANEL
ALT: 38 IU/L (ref 0–44)
AST: 27 IU/L (ref 0–40)
Albumin: 4.7 g/dL (ref 3.5–5.5)
Alkaline Phosphatase: 66 IU/L (ref 39–117)
Bilirubin Total: 0.8 mg/dL (ref 0.0–1.2)
Bilirubin, Direct: 0.27 mg/dL (ref 0.00–0.40)
Total Protein: 6.8 g/dL (ref 6.0–8.5)

## 2016-10-08 LAB — LIPID PANEL
CHOLESTEROL TOTAL: 79 mg/dL — AB (ref 100–199)
Chol/HDL Ratio: 2.2 ratio (ref 0.0–5.0)
HDL: 36 mg/dL — AB (ref >39)
TRIGLYCERIDES: 252 mg/dL — AB (ref 0–149)
VLDL CHOLESTEROL CAL: 50 mg/dL — AB (ref 5–40)

## 2016-10-09 NOTE — Telephone Encounter (Signed)
Called patient and left message to return call

## 2016-10-11 LAB — SPECIMEN STATUS REPORT

## 2016-10-11 LAB — LDL CHOLESTEROL, DIRECT: LDL Direct: 16 mg/dL (ref 0–99)

## 2016-10-12 ENCOUNTER — Encounter: Payer: Self-pay | Admitting: Cardiology

## 2016-10-12 ENCOUNTER — Other Ambulatory Visit: Payer: Self-pay | Admitting: Pharmacist

## 2016-10-12 MED ORDER — ALIROCUMAB 150 MG/ML ~~LOC~~ SOPN: 1.0000 "pen " | PEN_INJECTOR | SUBCUTANEOUS | 3 refills | Status: DC

## 2016-10-14 ENCOUNTER — Telehealth: Payer: Self-pay | Admitting: Cardiology

## 2016-10-14 NOTE — Telephone Encounter (Signed)
New message     Needs prior authorization for the medication  Alirocumab (PRALUENT) 150 MG/ML SOPN Inject 1 pen into the skin every 14 (fourteen) days.

## 2016-10-15 NOTE — Telephone Encounter (Addendum)
Called [employer] to see why another PA is needed when it took 4 months to get his initial approval and he has only been approved for 2 months. They will attempt to retroactively change PA approval date to 08/14/16 when it was actually approved rather than 04/15/16 when the PA process was started.  Representative then stated that PA was approved through January 2019 but that he is only approved for a 1 month supply at a time, not a 3 month supply. I was on the phone for over an hour with his insurance company and got no where.  Called the specialty pharmacy back to see if they can just do a vacation override since pt needs a 3 month supply of his Praluent when he goes out of the country later this year. They will follow up with pt about this.

## 2016-10-15 NOTE — Telephone Encounter (Signed)
F/u message  Brid[place] from [place] call to f/u on prior authorization. Please call back to discuss

## 2016-10-19 ENCOUNTER — Ambulatory Visit (INDEPENDENT_AMBULATORY_CARE_PROVIDER_SITE_OTHER): Payer: BLUE CROSS/BLUE SHIELD | Admitting: Family Medicine

## 2016-10-19 ENCOUNTER — Encounter: Payer: Self-pay | Admitting: Family Medicine

## 2016-10-19 VITALS — BP 110/72 | HR 101 | Temp 97.8°F | Ht 70.0 in | Wt 213.3 lb

## 2016-10-19 DIAGNOSIS — M545 Low back pain, unspecified: Secondary | ICD-10-CM

## 2016-10-19 DIAGNOSIS — E119 Type 2 diabetes mellitus without complications: Secondary | ICD-10-CM | POA: Diagnosis not present

## 2016-10-19 DIAGNOSIS — Z1159 Encounter for screening for other viral diseases: Secondary | ICD-10-CM | POA: Diagnosis not present

## 2016-10-19 DIAGNOSIS — G609 Hereditary and idiopathic neuropathy, unspecified: Secondary | ICD-10-CM | POA: Diagnosis not present

## 2016-10-19 MED ORDER — CYCLOBENZAPRINE HCL 10 MG PO TABS: 10.0000 mg | ORAL_TABLET | Freq: Three times a day (TID) | ORAL | 0 refills | Status: DC | PRN

## 2016-10-19 NOTE — Progress Notes (Signed)
Subjective:     Patient ID: Brandon Frank, male   DOB: Sep 15, 1955, 61 y.o.   MRN: [id number]  HPI Patient seen with low back pain. About a month ago he developed some right mid thoracic pain that seemed to be improving. He was then out in his garage and was lifting something about 4 days ago when noticed some pain right lower lumbar area. Radiates occasionally toward the right thigh. No weakness. He has some chronic neuropathy but no new numbness. No loss of urine or stool continence. Pain is worse with movement. He's gotten some improvement with ice. Mild improvement with ibuprofen. Early morning stiffness. Had previous physical therapy and has been doing some stretches  Past Medical History:  Diagnosis Date  . Chronic low back pain   . Ejection fraction   . Family history of premature CAD 03/20/2015  . Hyperlipidemia   . Hypertension associated with diabetes ([place]) 04/01/2016  . Obstructive sleep apnea 12/06/2013   NPSG 11/14/13- Severe OSA, AHI 86/ hr with loud snore, desat ot 80%, titrated to CPAP 12, weight 215 lbs   . Sinus tachycardia 10/27/2013   Persistent mild sinus tachycardia, October, 2015   //   48 hour Holter, October, 2015, rare PACs and PVCs. Heart rate range 70-128, mean heart rate 95 during the daytime, mean heart rate 85 during the resting hours.   //   Patient was diagnosed with severe sleep apnea. C Pap was started and his resting sinus tachycardia improved greatly.    Past Surgical History:  Procedure Laterality Date  . HERNIA REPAIR    . PROSTATE BIOPSY N/A   . rotator cuff surgery  2012   Dr. [name]    reports that he quit smoking about 41 years ago. He has never used smokeless tobacco. He reports that he drinks about 1.2 oz of alcohol per week . He reports that he does not use drugs. family history includes Diabetes in his brother and paternal grandfather; Heart disease (age of onset: 55) in his father; Hyperlipidemia in his brother and father; Hypertension in his  brother and father. No Known Allergies   Review of Systems  Constitutional: Negative for activity change, appetite change and fever.  Respiratory: Negative for cough and shortness of breath.   Cardiovascular: Negative for chest pain and leg swelling.  Gastrointestinal: Negative for abdominal pain and vomiting.  Genitourinary: Negative for dysuria, flank pain and hematuria.  Musculoskeletal: Positive for back pain. Negative for joint swelling.  Neurological: Negative for weakness and numbness.       Objective:   Physical Exam  Constitutional: He is oriented to person, place, and time. He appears well-developed and well-nourished. No distress.  Neck: No thyromegaly present.  Cardiovascular: Normal rate, regular rhythm and normal heart sounds.   No murmur heard. Pulmonary/Chest: Effort normal and breath sounds normal. No respiratory distress. He has no wheezes. He has no rales.  Musculoskeletal: He exhibits no edema.  Straight leg raise are negative  Neurological: He is alert and oriented to person, place, and time. He has normal reflexes. No cranial nerve deficit.  Symmetric reflexes. Strength full lower extremities  Skin: No rash noted.       Assessment:     Right lower lumbar back strain    Plan:     -Continue heat and/or ice as needed for symptom relief -Continue extension stretches -Flexeril 10 mg daily at bedtime when necessary muscle spasm -Continue short-term use of ibuprofen -Touch base if back pain not improving  next couple weeks  [name] MD [facility] at [facility]

## 2016-10-19 NOTE — Patient Instructions (Signed)

## 2016-10-20 LAB — HEPATITIS C ANTIBODY
HEP C AB: NONREACTIVE
SIGNAL TO CUT-OFF: 0.01 (ref <1.00)

## 2016-10-20 LAB — VITAMIN B12: VITAMIN B 12: 518 pg/mL (ref 211–911)

## 2016-10-20 LAB — HEMOGLOBIN A1C: HEMOGLOBIN A1C: 8.1 % — AB (ref 4.6–6.5)

## 2016-10-21 ENCOUNTER — Telehealth: Payer: Self-pay | Admitting: *Deleted

## 2016-10-21 NOTE — Telephone Encounter (Signed)
Brandon Frank from [place] left a msg on the refill vm requesting a call back at [phone] in regards to prior authorization. No further information was provided. Thanks, MI

## 2016-10-21 NOTE — Telephone Encounter (Signed)
Already spoke with pharmacy about this. PA is not needed, it's already valid through 2019. Returned call to pharmacy to inform them of this.

## 2016-10-27 ENCOUNTER — Ambulatory Visit: Payer: BLUE CROSS/BLUE SHIELD | Admitting: Family Medicine

## 2016-10-28 ENCOUNTER — Telehealth: Payer: Self-pay | Admitting: Family Medicine

## 2016-10-28 NOTE — Telephone Encounter (Signed)
Pt is returning the call to get the results and would like to have a detail msg left on the phone.

## 2016-11-02 NOTE — Telephone Encounter (Signed)
Results given.

## 2016-11-26 LAB — HM DIABETES EYE EXAM

## 2016-11-30 ENCOUNTER — Encounter: Payer: Self-pay | Admitting: Family Medicine

## 2016-12-08 ENCOUNTER — Other Ambulatory Visit: Payer: Self-pay | Admitting: Family Medicine

## 2016-12-31 ENCOUNTER — Telehealth: Payer: Self-pay | Admitting: *Deleted

## 2016-12-31 NOTE — Telephone Encounter (Signed)
Would defer to prescribing physician.

## 2016-12-31 NOTE — Telephone Encounter (Signed)
[place]  Refill request:  zaleplon (SONATA) 5 MG capsule  Last refill 06/23/16 filled by Dr Baird Lyons Last office visit 10/19/16  Patient is going out of the country for 3 months.  Okay to refill?

## 2017-01-01 NOTE — Telephone Encounter (Signed)
Denial sent to pharmacy. 

## 2017-03-10 ENCOUNTER — Other Ambulatory Visit: Payer: Self-pay | Admitting: Internal Medicine

## 2017-03-12 ENCOUNTER — Ambulatory Visit (INDEPENDENT_AMBULATORY_CARE_PROVIDER_SITE_OTHER): Payer: BLUE CROSS/BLUE SHIELD | Admitting: Family Medicine

## 2017-03-12 ENCOUNTER — Encounter: Payer: Self-pay | Admitting: Family Medicine

## 2017-03-12 VITALS — BP 120/80 | HR 85 | Temp 98.2°F | Wt 204.4 lb

## 2017-03-12 DIAGNOSIS — J0100 Acute maxillary sinusitis, unspecified: Secondary | ICD-10-CM

## 2017-03-12 MED ORDER — AMOXICILLIN-POT CLAVULANATE 875-125 MG PO TABS: 1.0000 | ORAL_TABLET | Freq: Two times a day (BID) | ORAL | 0 refills | Status: DC

## 2017-03-12 NOTE — Progress Notes (Signed)
Subjective:     Patient ID: Brandon Frank, male   DOB: [birth year], 62 y.o.   MRN: [id number]  HPI Acute illness. Onset 9 days ago of sinus congestion and cough. He just returned from 3 month trip to [place]. He states his son-in-law had similar symptoms. He has some increasing facial pain and some colored discharge. He actually started himself on amoxicillin 1000 mg twice daily 1 while in [place] and took this for 6 days. Felt slightly better when taking the antibiotic. Feels slightly worse now but no fever.  Past Medical History:  Diagnosis Date  . Chronic low back pain   . Ejection fraction   . Family history of premature CAD 03/20/2015  . Hyperlipidemia   . Hypertension associated with diabetes ([place]) 04/01/2016  . Obstructive sleep apnea 12/06/2013   NPSG 11/14/13- Severe OSA, AHI 86/ hr with loud snore, desat ot 80%, titrated to CPAP 12, weight 215 lbs   . Sinus tachycardia 10/27/2013   Persistent mild sinus tachycardia, October, 2015   //   48 hour Holter, October, 2015, rare PACs and PVCs. Heart rate range 70-128, mean heart rate 95 during the daytime, mean heart rate 85 during the resting hours.   //   Patient was diagnosed with severe sleep apnea. C Pap was started and his resting sinus tachycardia improved greatly.    Past Surgical History:  Procedure Laterality Date  . HERNIA REPAIR    . PROSTATE BIOPSY N/A   . rotator cuff surgery  2012   Dr. [name]    reports that he quit smoking about 42 years ago. he has never used smokeless tobacco. He reports that he drinks about 1.2 oz of alcohol per week. He reports that he does not use drugs. family history includes Diabetes in his brother and paternal grandfather; Heart disease (age of onset: 60) in his father; Hyperlipidemia in his brother and father; Hypertension in his brother and father. No Known Allergies   Review of Systems  Constitutional: Positive for fatigue. Negative for chills and fever.  HENT: Positive for sinus pressure  and sinus pain. Negative for sore throat.   Respiratory: Positive for cough. Negative for wheezing.   Cardiovascular: Negative for chest pain.       Objective:   Physical Exam  Constitutional: He appears well-developed and well-nourished.  HENT:  Right Ear: External ear normal.  Left Ear: External ear normal.  Mouth/Throat: Oropharynx is clear and moist.  Neck: Neck supple.  Cardiovascular: Normal rate and regular rhythm.  Pulmonary/Chest: Effort normal and breath sounds normal. No respiratory distress. He has no wheezes. He has no rales.  Lymphadenopathy:    He has no cervical adenopathy.       Assessment:     Acute sinusitis. Not clear at this point whether this is more viral or bacterial. He did have partial improvement with amoxicillin.    Plan:     -Printed prescription for Augmentin 875 mg twice daily for 10 days but we recommended he first try conservative measures with Mucinex, saline nasal irrigation and giving this little more time  [name] MD [name]

## 2017-03-12 NOTE — Patient Instructions (Signed)

## 2017-03-13 ENCOUNTER — Other Ambulatory Visit: Payer: Self-pay | Admitting: Cardiology

## 2017-03-19 ENCOUNTER — Other Ambulatory Visit: Payer: Self-pay | Admitting: Pharmacist

## 2017-03-19 DIAGNOSIS — E7800 Pure hypercholesterolemia, unspecified: Secondary | ICD-10-CM

## 2017-03-22 ENCOUNTER — Other Ambulatory Visit: Payer: BLUE CROSS/BLUE SHIELD | Admitting: *Deleted

## 2017-03-22 ENCOUNTER — Other Ambulatory Visit: Payer: Self-pay | Admitting: Internal Medicine

## 2017-03-22 DIAGNOSIS — E7800 Pure hypercholesterolemia, unspecified: Secondary | ICD-10-CM

## 2017-03-22 LAB — LIPID PANEL
Chol/HDL Ratio: 2.7 ratio (ref 0.0–5.0)
Cholesterol, Total: 114 mg/dL (ref 100–199)
HDL: 42 mg/dL (ref >39)
LDL CALC: 47 mg/dL (ref 0–99)
Triglycerides: 127 mg/dL (ref 0–149)
VLDL CHOLESTEROL CAL: 25 mg/dL (ref 5–40)

## 2017-03-23 ENCOUNTER — Other Ambulatory Visit: Payer: Self-pay | Admitting: Pharmacist

## 2017-03-23 MED ORDER — ALIROCUMAB 150 MG/ML ~~LOC~~ SOPN: 1.0000 "pen " | PEN_INJECTOR | SUBCUTANEOUS | 11 refills | Status: DC

## 2017-04-21 ENCOUNTER — Telehealth: Payer: Self-pay

## 2017-04-21 MED ORDER — ZALEPLON 5 MG PO CAPS: 5.0000 mg | ORAL_CAPSULE | Freq: Every evening | ORAL | 5 refills | Status: DC | PRN

## 2017-04-21 NOTE — Telephone Encounter (Signed)
Patient requesting refill on Sonata. Per Dr. Radford Pax okay to refill.

## 2017-04-27 ENCOUNTER — Other Ambulatory Visit: Payer: Self-pay | Admitting: *Deleted

## 2017-04-27 MED ORDER — ZALEPLON 5 MG PO CAPS: 5.0000 mg | ORAL_CAPSULE | Freq: Every evening | ORAL | 5 refills | Status: DC | PRN

## 2017-04-28 ENCOUNTER — Ambulatory Visit (INDEPENDENT_AMBULATORY_CARE_PROVIDER_SITE_OTHER): Payer: BLUE CROSS/BLUE SHIELD | Admitting: Cardiology

## 2017-04-28 ENCOUNTER — Encounter: Payer: Self-pay | Admitting: Cardiology

## 2017-04-28 VITALS — BP 136/80 | HR 87 | Wt 209.8 lb

## 2017-04-28 DIAGNOSIS — E1159 Type 2 diabetes mellitus with other circulatory complications: Secondary | ICD-10-CM | POA: Diagnosis not present

## 2017-04-28 DIAGNOSIS — G4733 Obstructive sleep apnea (adult) (pediatric): Secondary | ICD-10-CM | POA: Diagnosis not present

## 2017-04-28 DIAGNOSIS — I10 Essential (primary) hypertension: Secondary | ICD-10-CM | POA: Diagnosis not present

## 2017-04-28 DIAGNOSIS — G4700 Insomnia, unspecified: Secondary | ICD-10-CM | POA: Diagnosis not present

## 2017-04-28 DIAGNOSIS — I152 Hypertension secondary to endocrine disorders: Secondary | ICD-10-CM

## 2017-04-28 MED ORDER — ZALEPLON 5 MG PO CAPS: 5.0000 mg | ORAL_CAPSULE | Freq: Every evening | ORAL | 2 refills | Status: DC | PRN

## 2017-04-28 MED ORDER — ZALEPLON 5 MG PO CAPS: 5.0000 mg | ORAL_CAPSULE | Freq: Every evening | ORAL | 5 refills | Status: DC | PRN

## 2017-04-28 NOTE — Progress Notes (Signed)
Cardiology Office Note:    Date:  04/28/2017   ID:  Brandon Frank, DOB [birth year], MRN [id number]  PCP:  [name], MD  Cardiologist:  No primary care provider on file.    Referring MD: [name], MD   Chief Complaint  Patient presents with  . Sleep Apnea  . Hypertension    History of Present Illness:    Brandon Frank is a 62 y.o. male with a hx of sinus tachycardia and severe OSA on CPAP.  He is doing well with his CPAP device.  He tolerates the mask and feels the pressure is adequate.  Since going on CPAP he feels rested in the am and has no significant daytime sleepiness.  He denies any significant mouth or nasal dryness or nasal congestion.  He does not think that he snores.     Past Medical History:  Diagnosis Date  . Chronic low back pain   . Ejection fraction   . Family history of premature CAD 03/20/2015  . Hyperlipidemia   . Hypertension associated with diabetes ([place]) 04/01/2016  . Obstructive sleep apnea 12/06/2013   NPSG 11/14/13- Severe OSA, AHI 86/ hr with loud snore, desat ot 80%, titrated to CPAP 12, weight 215 lbs   . Sinus tachycardia 10/27/2013   Persistent mild sinus tachycardia, October, 2015   //   48 hour Holter, October, 2015, rare PACs and PVCs. Heart rate range 70-128, mean heart rate 95 during the daytime, mean heart rate 85 during the resting hours.   //   Patient was diagnosed with severe sleep apnea. C Pap was started and his resting sinus tachycardia improved greatly.     Past Surgical History:  Procedure Laterality Date  . HERNIA REPAIR    . PROSTATE BIOPSY N/A   . rotator cuff surgery  2012   Dr. [name]    Current Medications: Current Meds  Medication Sig  . Alirocumab (PRALUENT) 150 MG/ML SOPN Inject 1 pen into the skin every 14 (fourteen) days.  [name] amoxicillin-clavulanate (AUGMENTIN) 875-125 MG tablet Take 1 tablet by mouth 2 (two) times daily.  . ANDROGEL PUMP 20.25 MG/ACT (1.62%) GEL 1 pump to both upper arm area  twice daily (total 4 pumps daily) per Dr [name]  . atorvastatin (LIPITOR) 80 MG tablet Take 80 mg by mouth daily.  [name] BAYER MICROLET LANCETS lancets CHECK 2 TIMES DAILY  . CONTOUR NEXT TEST test strip USE TO TEST BLOOD SUGAR DAILY  . DULoxetine (CYMBALTA) 60 MG capsule Take 3 capsules (180 mg total) by mouth daily. Per Dr Candis Schatz  . empagliflozin (JARDIANCE) 10 MG TABS tablet Take 10 mg by mouth daily.  [name] ezetimibe (ZETIA) 10 MG tablet TAKE ONE TABLET BY MOUTH ONCE DAILY  . fluticasone (FLONASE) 50 MCG/ACT nasal spray USE 1 SPRAY(S) IN EACH NOSTRIL ONCE DAILY AS NEEDED FOR ALLERGIES OR RHINITIS  . gabapentin (NEURONTIN) 300 MG capsule Take 300 mg by mouth 3 (three) times daily.   [name] glucosamine-chondroitin 500-400 MG tablet Take 1 tablet by mouth daily.  [name] glucose blood test strip One touch verio.  Test once daily. Dx E11.9  . Lancets (ONETOUCH ULTRASOFT) lancets One touch verio lancets.  Test once daily.  Dx e11.9  . lisinopril (PRINIVIL,ZESTRIL) 10 MG tablet Take 1 tablet (10 mg total) by mouth daily. Please make yearly appt with Dr. Radford Pax for March. 1st attempt  . LORazepam (ATIVAN) 0.5 MG tablet Take 1 tablet (0.5 mg total) by mouth as needed for anxiety.  [name]  metFORMIN (GLUCOPHAGE) 500 MG tablet TAKE TWO TABLETS BY MOUTH TWICE DAILY WITH  A  MEAL  . metFORMIN (GLUCOPHAGE) 500 MG tablet TAKE TWO TABLETS BY MOUTH TWICE DAILY WITH A MEAL  . Multiple Vitamin (MULTIVITAMIN) tablet Take 1 tablet by mouth daily.    . tadalafil (CIALIS) 5 MG tablet Take 5 mg by mouth daily as needed for erectile dysfunction.  . zaleplon (SONATA) 5 MG capsule Take 1 capsule (5 mg total) by mouth at bedtime as needed for sleep. for sleep  . zolpidem (AMBIEN) 10 MG tablet TAKE ONE TABLET BY MOUTH AT BEDTIME AS NEEDED FOR SLEEP     Allergies:   Patient has no known allergies.   Social History   Socioeconomic History  . Marital status: Married    Spouse name: Not on file  . Number of children: Not on file  .  Years of education: Not on file  . Highest education level: Not on file  Occupational History  . Not on file  Social Needs  . Financial resource strain: Not on file  . Food insecurity:    Worry: Not on file    Inability: Not on file  . Transportation needs:    Medical: Not on file    Non-medical: Not on file  Tobacco Use  . Smoking status: Former Smoker    Last attempt to quit: 03/20/1975    Years since quitting: 42.1  . Smokeless tobacco: Never Used  Substance and Sexual Activity  . Alcohol use: Yes    Alcohol/week: 1.2 oz    Types: 2 Cans of beer per week    Comment: daily  . Drug use: No  . Sexual activity: Not on file  Lifestyle  . Physical activity:    Days per week: Not on file    Minutes per session: Not on file  . Stress: Not on file  Relationships  . Social connections:    Talks on phone: Not on file    Gets together: Not on file    Attends religious service: Not on file    Active member of club or organization: Not on file    Attends meetings of clubs or organizations: Not on file    Relationship status: Not on file  Other Topics Concern  . Not on file  Social History Narrative  . Not on file     Family History: The patient's family history includes Diabetes in his brother and paternal grandfather; Heart disease (age of onset: 49) in his father; Hyperlipidemia in his brother and father; Hypertension in his brother and father.  ROS:   Please see the history of present illness.    ROS  All other systems reviewed and negative.   EKGs/Labs/Other Studies Reviewed:    The following studies were reviewed today: PAP download  EKG:  EKG is not ordered today.    Recent Labs: 10/08/2016: ALT 38   Recent Lipid Panel    Component Value Date/Time   CHOL 114 03/22/2017 0804   TRIG 127 03/22/2017 0804   TRIG 88 11/19/2005 1012   HDL 42 03/22/2017 0804   CHOLHDL 2.7 03/22/2017 0804   CHOLHDL 4 10/01/2015 1104   VLDL 19.0 10/01/2015 1104   LDLCALC 47  03/22/2017 0804   LDLDIRECT 16 10/08/2016 0847   LDLDIRECT 168.2 10/29/2008 0813    Physical Exam:    VS:  BP 136/80   Pulse 87   Wt 209 lb 12.8 oz (95.2 kg)   SpO2 95%  BMI 30.10 kg/m     Wt Readings from Last 3 Encounters:  04/28/17 209 lb 12.8 oz (95.2 kg)  03/12/17 204 lb 6.4 oz (92.7 kg)  10/19/16 213 lb 4.8 oz (96.8 kg)     GEN:  Well nourished, well developed in no acute distress HEENT: Normal NECK: No JVD; No carotid bruits LYMPHATICS: No lymphadenopathy CARDIAC: RRR, no murmurs, rubs, gallops RESPIRATORY:  Clear to auscultation without rales, wheezing or rhonchi  ABDOMEN: Soft, non-tender, non-distended MUSCULOSKELETAL:  No edema; No deformity  SKIN: Warm and dry NEUROLOGIC:  Alert and oriented x 3 PSYCHIATRIC:  Normal affect   ASSESSMENT:    1. Obstructive sleep apnea   2. Hypertension associated with diabetes ([place])   3. Insomnia, unspecified type    PLAN:    In order of problems listed above:  1.  OSA - the patient is tolerating PAP therapy well without any problems. The PAP download was reviewed today and showed an AHI of 1.4/hr on 12 cm H2O with 96% compliance in using more than 4 hours nightly.  The patient has been using and benefiting from PAP use and will continue to benefit from therapy.   2.  HTN - BP is controlled on exam today.  He will continue on lisinopril 10 mg daily.  3.  Sleep maintenance insomnia - he takes sonata PRN for sleep if he wakes up during the night.    4.  Hyperlpidemia -he is now on praulent and LDL is at goal at 47.   Medication Adjustments/Labs and Tests Ordered: Current medicines are reviewed at length with the patient today.  Concerns regarding medicines are outlined above.  No orders of the defined types were placed in this encounter.  No orders of the defined types were placed in this encounter.   Signed, Fransico Him, MD  04/28/2017 8:20 AM    [facility]

## 2017-04-28 NOTE — Patient Instructions (Signed)
Medication Instructions:  Your physician recommends that you continue on your current medications as directed. Please refer to the Current Medication list given to you today.  If you need a refill on your cardiac medications, please contact your pharmacy first.  Labwork: None ordered   Testing/Procedures: None ordered   Follow-Up: Your physician wants you to follow-up in: 1 year with Dr. Turner. You will receive a reminder letter in the mail two months in advance. If you don't receive a letter, please call our office to schedule the follow-up appointment.  Any Other Special Instructions Will Be Listed Below (If Applicable).   Thank you for choosing [facility]    Rena Gilford Lardizabal, RN  [phone]  If you need a refill on your cardiac medications before your next appointment, please call your pharmacy.   

## 2017-06-01 LAB — PSA: PSA: 4.93

## 2017-06-02 ENCOUNTER — Other Ambulatory Visit: Payer: Self-pay | Admitting: Nurse Practitioner

## 2017-06-02 DIAGNOSIS — R972 Elevated prostate specific antigen [PSA]: Secondary | ICD-10-CM

## 2017-06-10 ENCOUNTER — Other Ambulatory Visit: Payer: Self-pay | Admitting: Cardiology

## 2017-06-22 ENCOUNTER — Inpatient Hospital Stay
Admission: RE | Admit: 2017-06-22 | Discharge: 2017-06-22 | Disposition: A | Discharge status: Home / Self Care | Payer: BLUE CROSS/BLUE SHIELD | Source: Ambulatory Visit | Attending: Nurse Practitioner | Admitting: Nurse Practitioner

## 2017-07-01 ENCOUNTER — Ambulatory Visit
Admission: RE | Admit: 2017-07-01 | Discharge: 2017-07-01 | Disposition: A | Discharge status: Home / Self Care | Payer: BLUE CROSS/BLUE SHIELD | Source: Ambulatory Visit | Attending: Nurse Practitioner | Admitting: Nurse Practitioner

## 2017-07-01 DIAGNOSIS — R972 Elevated prostate specific antigen [PSA]: Secondary | ICD-10-CM

## 2017-07-01 MED ORDER — GADOBENATE DIMEGLUMINE 529 MG/ML IV SOLN
19.0000 mL | Freq: Once | INTRAVENOUS | Status: AC | PRN
  Administered 2017-07-01: 19 mL via INTRAVENOUS

## 2017-07-06 ENCOUNTER — Other Ambulatory Visit: Payer: Self-pay | Admitting: Cardiology

## 2017-07-06 ENCOUNTER — Other Ambulatory Visit: Payer: Self-pay | Admitting: Family Medicine

## 2017-07-13 ENCOUNTER — Ambulatory Visit (INDEPENDENT_AMBULATORY_CARE_PROVIDER_SITE_OTHER): Payer: BLUE CROSS/BLUE SHIELD | Admitting: Family Medicine

## 2017-07-13 ENCOUNTER — Encounter: Payer: Self-pay | Admitting: Family Medicine

## 2017-07-13 ENCOUNTER — Other Ambulatory Visit: Payer: Self-pay | Admitting: Gastroenterology

## 2017-07-13 VITALS — BP 110/74 | HR 102 | Temp 98.3°F | Wt 203.6 lb

## 2017-07-13 DIAGNOSIS — E1159 Type 2 diabetes mellitus with other circulatory complications: Secondary | ICD-10-CM | POA: Diagnosis not present

## 2017-07-13 DIAGNOSIS — E119 Type 2 diabetes mellitus without complications: Secondary | ICD-10-CM

## 2017-07-13 DIAGNOSIS — E7800 Pure hypercholesterolemia, unspecified: Secondary | ICD-10-CM | POA: Diagnosis not present

## 2017-07-13 DIAGNOSIS — I499 Cardiac arrhythmia, unspecified: Secondary | ICD-10-CM

## 2017-07-13 DIAGNOSIS — I152 Hypertension secondary to endocrine disorders: Secondary | ICD-10-CM

## 2017-07-13 DIAGNOSIS — I4891 Unspecified atrial fibrillation: Secondary | ICD-10-CM

## 2017-07-13 DIAGNOSIS — I4892 Unspecified atrial flutter: Secondary | ICD-10-CM

## 2017-07-13 DIAGNOSIS — I10 Essential (primary) hypertension: Secondary | ICD-10-CM

## 2017-07-13 HISTORY — DX: Unspecified atrial fibrillation: I48.91

## 2017-07-13 HISTORY — DX: Unspecified atrial flutter: I48.92

## 2017-07-13 LAB — COMPREHENSIVE METABOLIC PANEL
ALT: 34 U/L (ref 0–53)
AST: 19 U/L (ref 0–37)
Albumin: 4.9 g/dL (ref 3.5–5.2)
Alkaline Phosphatase: 56 U/L (ref 39–117)
BUN: 15 mg/dL (ref 6–23)
CALCIUM: 9.6 mg/dL (ref 8.4–10.5)
CHLORIDE: 100 meq/L (ref 96–112)
CO2: 24 mEq/L (ref 19–32)
CREATININE: 0.82 mg/dL (ref 0.40–1.50)
GFR: 101.78 mL/min (ref >60.00)
Glucose, Bld: 160 mg/dL — ABNORMAL HIGH (ref 70–99)
Potassium: 4 mEq/L (ref 3.5–5.1)
Sodium: 138 mEq/L (ref 135–145)
Total Bilirubin: 0.6 mg/dL (ref 0.2–1.2)
Total Protein: 6.9 g/dL (ref 6.0–8.3)

## 2017-07-13 LAB — POCT GLYCOSYLATED HEMOGLOBIN (HGB A1C): Hemoglobin A1C: 9.6 % — AB (ref 4.0–5.6)

## 2017-07-13 LAB — CBC WITH DIFFERENTIAL/PLATELET
BASOS PCT: 0.8 % (ref 0.0–3.0)
Basophils Absolute: 0.1 10*3/uL (ref 0.0–0.1)
EOS ABS: 0.1 10*3/uL (ref 0.0–0.7)
Eosinophils Relative: 1.7 % (ref 0.0–5.0)
HEMATOCRIT: 49.4 % (ref 39.0–52.0)
HEMOGLOBIN: 17.1 g/dL — AB (ref 13.0–17.0)
LYMPHS PCT: 28.2 % (ref 12.0–46.0)
Lymphs Abs: 2.3 10*3/uL (ref 0.7–4.0)
MCHC: 34.5 g/dL (ref 30.0–36.0)
MCV: 96.5 fl (ref 78.0–100.0)
MONOS PCT: 10.6 % (ref 3.0–12.0)
Monocytes Absolute: 0.9 10*3/uL (ref 0.1–1.0)
Neutro Abs: 4.8 10*3/uL (ref 1.4–7.7)
Neutrophils Relative %: 58.7 % (ref 43.0–77.0)
Platelets: 172 10*3/uL (ref 150.0–400.0)
RBC: 5.12 Mil/uL (ref 4.22–5.81)
RDW: 13.6 % (ref 11.5–15.5)
WBC: 8.1 10*3/uL (ref 4.0–10.5)

## 2017-07-13 LAB — TSH: TSH: 1.35 u[IU]/mL (ref 0.35–4.50)

## 2017-07-13 MED ORDER — METOPROLOL SUCCINATE ER 50 MG PO TB24: 50.0000 mg | ORAL_TABLET | Freq: Every day | ORAL | 5 refills | Status: DC

## 2017-07-13 MED ORDER — DULAGLUTIDE 0.75 MG/0.5ML ~~LOC~~ SOAJ: 0.7500 mg | SUBCUTANEOUS | 11 refills | Status: DC

## 2017-07-13 NOTE — Progress Notes (Signed)
Subjective:     Patient ID: Brandon Frank, male   DOB: May 26, 1955, 62 y.o.   MRN: [id number]  HPI Patient seen for medical follow-up. His chronic problems include history of hypertension, obstructive sleep apnea, type 2 diabetes, hyperlipidemia  Has recently seen urologist. He states he had prostate MRI and is being scheduled for biopsy. He is still on testosterone replacement this point. Regarding diabetes he takes combination therapy with metformin and Jardiance. Most of his blood sugars have been consistently over 200. Last A1c 8.1%  Denies any recent chest pains. No dizziness. No headaches.  Past Medical History:  Diagnosis Date  . Chronic low back pain   . Ejection fraction   . Family history of premature CAD 03/20/2015  . Hyperlipidemia   . Hypertension associated with diabetes ([place]) 04/01/2016  . Obstructive sleep apnea 12/06/2013   NPSG 11/14/13- Severe OSA, AHI 86/ hr with loud snore, desat ot 80%, titrated to CPAP 12, weight 215 lbs   . Sinus tachycardia 10/27/2013   Persistent mild sinus tachycardia, October, 2015   //   48 hour Holter, October, 2015, rare PACs and PVCs. Heart rate range 70-128, mean heart rate 95 during the daytime, mean heart rate 85 during the resting hours.   //   Patient was diagnosed with severe sleep apnea. C Pap was started and his resting sinus tachycardia improved greatly.    Past Surgical History:  Procedure Laterality Date  . HERNIA REPAIR    . PROSTATE BIOPSY N/A   . rotator cuff surgery  2012   Dr. [name]    reports that he quit smoking about 42 years ago. He has never used smokeless tobacco. He reports that he drinks about 1.2 oz of alcohol per week. He reports that he does not use drugs. family history includes Diabetes in his brother and paternal grandfather; Heart disease (age of onset: 51) in his father; Hyperlipidemia in his brother and father; Hypertension in his brother and father. No Known Allergies   Review of Systems   Constitutional: Negative for fatigue.  Eyes: Negative for visual disturbance.  Respiratory: Negative for cough, chest tightness and shortness of breath.   Cardiovascular: Negative for chest pain, palpitations and leg swelling.  Endocrine: Negative for polydipsia and polyuria.  Neurological: Negative for dizziness, syncope, weakness, light-headedness and headaches.       Objective:   Physical Exam  Constitutional: He is oriented to person, place, and time. He appears well-developed and well-nourished.  HENT:  Right Ear: External ear normal.  Left Ear: External ear normal.  Mouth/Throat: Oropharynx is clear and moist.  Eyes: Pupils are equal, round, and reactive to light.  Neck: Neck supple. No thyromegaly present.  Cardiovascular:  Irregular rhythm with rate around 100  Pulmonary/Chest: Effort normal and breath sounds normal. No respiratory distress. He has no wheezes. He has no rales.  Musculoskeletal: He exhibits no edema.  Neurological: He is alert and oriented to person, place, and time.       Assessment:     #1 type 2 diabetes poorly controlled A1c 9.6%  #2 hypertension stable and at goal  #3 dyslipidemia  #4 irregular heart rhythm on exam-EKG confirms atrial fibrillation/flutter.    Plan:     -Check EKG- as above. -Start trulicity 3.15 mg once weekly. He'll continue with Jardiance and metformin. We'll plan three-month recheck of A1c. -EKG confirms A fib/flutter.  CHADsVasc score of 2. -check TSH, CBC, CMP -Start Metoprolol XL 50 mg po qd -recommend start anti-coagulation.  We discussed with pt risk of CVA of 2.2%/year for score of 2.  He declines at this time.  Does agree to come back tomorrow for re-check .  Will discuss again with him recommendation for anti-coagulation.  [name] MD [facility] [place]  -

## 2017-07-13 NOTE — Patient Instructions (Signed)
Start aspirin one daily  Follow up tomorrow

## 2017-07-14 ENCOUNTER — Encounter: Payer: Self-pay | Admitting: Family Medicine

## 2017-07-14 ENCOUNTER — Ambulatory Visit (INDEPENDENT_AMBULATORY_CARE_PROVIDER_SITE_OTHER): Payer: BLUE CROSS/BLUE SHIELD | Admitting: Family Medicine

## 2017-07-14 VITALS — BP 122/62 | HR 85 | Temp 97.8°F | Wt 203.6 lb

## 2017-07-14 DIAGNOSIS — I4892 Unspecified atrial flutter: Secondary | ICD-10-CM | POA: Diagnosis not present

## 2017-07-14 DIAGNOSIS — E1165 Type 2 diabetes mellitus with hyperglycemia: Secondary | ICD-10-CM

## 2017-07-14 DIAGNOSIS — I4891 Unspecified atrial fibrillation: Secondary | ICD-10-CM

## 2017-07-14 NOTE — Patient Instructions (Signed)
Atrial Fibrillation Atrial fibrillation is a type of irregular or rapid heartbeat (arrhythmia). In atrial fibrillation, the heart quivers continuously in a chaotic pattern. This occurs when parts of the heart receive disorganized signals that make the heart unable to pump blood normally. This can increase the risk for stroke, heart failure, and other heart-related conditions. There are different types of atrial fibrillation, including:  Paroxysmal atrial fibrillation. This type starts suddenly, and it usually stops on its own shortly after it starts.  Persistent atrial fibrillation. This type often lasts longer than a week. It may stop on its own or with treatment.  Long-lasting persistent atrial fibrillation. This type lasts longer than 12 months.  Permanent atrial fibrillation. This type does not go away.  Talk with your health care provider to learn about the type of atrial fibrillation that you have. What are the causes? This condition is caused by some heart-related conditions or procedures, including:  A heart attack.  Coronary artery disease.  Heart failure.  Heart valve conditions.  High blood pressure.  Inflammation of the sac that surrounds the heart (pericarditis).  Heart surgery.  Certain heart rhythm disorders, such as Wolf-Parkinson-White syndrome.  Other causes include:  Pneumonia.  Obstructive sleep apnea.  Blockage of an artery in the lungs (pulmonary embolism, or PE).  Lung cancer.  Chronic lung disease.  Thyroid problems, especially if the thyroid is overactive (hyperthyroidism).  Caffeine.  Excessive alcohol use or illegal drug use.  Use of some medicines, including certain decongestants and diet pills.  Sometimes, the cause cannot be found. What increases the risk? This condition is more likely to develop in:  People who are older in age.  People who smoke.  People who have diabetes mellitus.  People who are overweight  (obese).  Athletes who exercise vigorously.  What are the signs or symptoms? Symptoms of this condition include:  A feeling that your heart is beating rapidly or irregularly.  A feeling of discomfort or pain in your chest.  Shortness of breath.  Sudden light-headedness or weakness.  Getting tired easily during exercise.  In some cases, there are no symptoms. How is this diagnosed? Your health care provider may be able to detect atrial fibrillation when taking your pulse. If detected, this condition may be diagnosed with:  An electrocardiogram (ECG).  A Holter monitor test that records your heartbeat patterns over a 24-hour period.  Transthoracic echocardiogram (TTE) to evaluate how blood flows through your heart.  Transesophageal echocardiogram (TEE) to view more detailed images of your heart.  A stress test.  Imaging tests, such as a CT scan or chest X-ray.  Blood tests.  How is this treated? The main goals of treatment are to prevent blood clots from forming and to keep your heart beating at a normal rate and rhythm. The type of treatment that you receive depends on many factors, such as your underlying medical conditions and how you feel when you are experiencing atrial fibrillation. This condition may be treated with:  Medicine to slow down the heart rate, bring the heart's rhythm back to normal, or prevent clots from forming.  Electrical cardioversion. This is a procedure that resets your heart's rhythm by delivering a controlled, low-energy shock to the heart through your skin.  Different types of ablation, such as catheter ablation, catheter ablation with pacemaker, or surgical ablation. These procedures destroy the heart tissues that send abnormal signals. When the pacemaker is used, it is placed under your skin to help your heart beat in   a regular rhythm.  Follow these instructions at home:  Take over-the counter and prescription medicines only as told by your  health care provider.  If your health care provider prescribed a blood-thinning medicine (anticoagulant), take it exactly as told. Taking too much blood-thinning medicine can cause bleeding. If you do not take enough blood-thinning medicine, you will not have the protection that you need against stroke and other problems.  Do not use tobacco products, including cigarettes, chewing tobacco, and e-cigarettes. If you need help quitting, ask your health care provider.  If you have obstructive sleep apnea, manage your condition as told by your health care provider.  Do not drink alcohol.  Do not drink beverages that contain caffeine, such as coffee, soda, and tea.  Maintain a healthy weight. Do not use diet pills unless your health care provider approves. Diet pills may make heart problems worse.  Follow diet instructions as told by your health care provider.  Exercise regularly as told by your health care provider.  Keep all follow-up visits as told by your health care provider. This is important. How is this prevented?  Avoid drinking beverages that contain caffeine or alcohol.  Avoid certain medicines, especially medicines that are used for breathing problems.  Avoid certain herbs and herbal medicines, such as those that contain ephedra or ginseng.  Do not use illegal drugs, such as cocaine and amphetamines.  Do not smoke.  Manage your high blood pressure. Contact a health care provider if:  You notice a change in the rate, rhythm, or strength of your heartbeat.  You are taking an anticoagulant and you notice increased bruising.  You tire more easily when you exercise or exert yourself. Get help right away if:  You have chest pain, abdominal pain, sweating, or weakness.  You feel nauseous.  You notice blood in your vomit, bowel movement, or urine.  You have shortness of breath.  You suddenly have swollen feet and ankles.  You feel dizzy.  You have sudden weakness or  numbness of the face, arm, or leg, especially on one side of the body.  You have trouble speaking, trouble understanding, or both (aphasia).  Your face or your eyelid droops on one side. These symptoms may represent a serious problem that is an emergency. Do not wait to see if the symptoms will go away. Get medical help right away. Call your local emergency services (911 in the [place] Do not drive yourself to the hospital. This information is not intended to replace advice given to you by your health care provider. Make sure you discuss any questions you have with your health care provider. Document Released: 01/05/2005 Document Revised: 05/15/2015 Document Reviewed: 05/02/2014 Elsevier Interactive Patient Education  [employer].  We are setting up Cardiology referral.

## 2017-07-14 NOTE — Progress Notes (Signed)
Subjective:     Patient ID: Brandon Frank, male   DOB: 09-24-1955, 62 y.o.   MRN: [id number]  HPI Patient seen for follow-up from visit yesterday. He was in for routine follow-up yesterday and we noted increased heart rate and irregularity and EKG showed A. fib/flutter. We started metoprolol 50 mg daily and he states a few hours after taking first dose heart rate was down and also less irregular  He states his noted multiple times in the past his heart rate will go up sometimes into the 130 range and sometimes even higher. He's had some sense of irregularity and this apparently has never been picked up on monitor/EKG. We obtained labs including thyroid functions which were unremarkable.   He has CHADs2Vasc score of at least 2 and we had discussed possible anticoagulation. Patient had reservations partly because of upcoming colonoscopy and also prostate biopsy. He did agree to come back today to reassess. He has no complaints at this time.  Poorly controlled type 2 diabetes with A1c yesterday 9.6%. We started trulicity. He also remains on Jardiance and metformin  Past Medical History:  Diagnosis Date  . Chronic low back pain   . Ejection fraction   . Family history of premature CAD 03/20/2015  . Hyperlipidemia   . Hypertension associated with diabetes ([place]) 04/01/2016  . Obstructive sleep apnea 12/06/2013   NPSG 11/14/13- Severe OSA, AHI 86/ hr with loud snore, desat ot 80%, titrated to CPAP 12, weight 215 lbs   . Sinus tachycardia 10/27/2013   Persistent mild sinus tachycardia, October, 2015   //   48 hour Holter, October, 2015, rare PACs and PVCs. Heart rate range 70-128, mean heart rate 95 during the daytime, mean heart rate 85 during the resting hours.   //   Patient was diagnosed with severe sleep apnea. C Pap was started and his resting sinus tachycardia improved greatly.    Past Surgical History:  Procedure Laterality Date  . HERNIA REPAIR    . PROSTATE BIOPSY N/A   . rotator cuff  surgery  2012   Dr. [name]    reports that he quit smoking about 42 years ago. He has never used smokeless tobacco. He reports that he drinks about 1.2 oz of alcohol per week. He reports that he does not use drugs. family history includes Diabetes in his brother and paternal grandfather; Heart disease (age of onset: 16) in his father; Hyperlipidemia in his brother and father; Hypertension in his brother and father. No Known Allergies   Review of Systems  Constitutional: Negative for fatigue.  Eyes: Negative for visual disturbance.  Respiratory: Negative for cough, chest tightness and shortness of breath.   Cardiovascular: Negative for chest pain, palpitations and leg swelling.  Neurological: Negative for dizziness, syncope, weakness, light-headedness and headaches.       Objective:   Physical Exam  Constitutional: He is oriented to person, place, and time. He appears well-developed and well-nourished.  Cardiovascular: Normal rate and regular rhythm.  Pulmonary/Chest: Effort normal and breath sounds normal.  Musculoskeletal: He exhibits no edema.  Neurological: He is alert and oriented to person, place, and time.       Assessment:     #1  A fib/flutter by EKG yesterday.  Back in sinus rhythm today.  #2 Type 2 diabetes poorly controlled.    Plan:     -get back in to see Cardiology -we again discussed possible anit-coagulation and he would like to discuss with cardiology first. -continue with Metoprolol -  continue with Trulicity -will plan repeat A1C within 3 months and insulin addition then if not to goal.  [name] MD [facility]

## 2017-07-20 ENCOUNTER — Encounter: Payer: Self-pay | Admitting: Cardiology

## 2017-07-20 ENCOUNTER — Encounter: Payer: Self-pay | Admitting: Family Medicine

## 2017-07-26 ENCOUNTER — Ambulatory Visit (HOSPITAL_COMMUNITY): Payer: BLUE CROSS/BLUE SHIELD | Attending: Cardiology

## 2017-07-26 ENCOUNTER — Ambulatory Visit
Admission: RE | Admit: 2017-07-26 | Discharge: 2017-07-26 | Disposition: A | Discharge status: Home / Self Care | Payer: BLUE CROSS/BLUE SHIELD | Source: Ambulatory Visit | Attending: Physician Assistant | Admitting: Physician Assistant

## 2017-07-26 ENCOUNTER — Encounter: Payer: Self-pay | Admitting: Cardiology

## 2017-07-26 ENCOUNTER — Other Ambulatory Visit: Payer: Self-pay | Admitting: Physician Assistant

## 2017-07-26 ENCOUNTER — Ambulatory Visit (INDEPENDENT_AMBULATORY_CARE_PROVIDER_SITE_OTHER): Payer: BLUE CROSS/BLUE SHIELD | Admitting: Physician Assistant

## 2017-07-26 ENCOUNTER — Encounter: Payer: Self-pay | Admitting: Physician Assistant

## 2017-07-26 ENCOUNTER — Other Ambulatory Visit: Payer: Self-pay

## 2017-07-26 VITALS — BP 116/62 | HR 95 | Ht 70.0 in | Wt 207.0 lb

## 2017-07-26 DIAGNOSIS — R000 Tachycardia, unspecified: Secondary | ICD-10-CM

## 2017-07-26 DIAGNOSIS — G8929 Other chronic pain: Secondary | ICD-10-CM | POA: Diagnosis not present

## 2017-07-26 DIAGNOSIS — I10 Essential (primary) hypertension: Secondary | ICD-10-CM | POA: Insufficient documentation

## 2017-07-26 DIAGNOSIS — I4892 Unspecified atrial flutter: Secondary | ICD-10-CM | POA: Diagnosis present

## 2017-07-26 DIAGNOSIS — Z9989 Dependence on other enabling machines and devices: Secondary | ICD-10-CM

## 2017-07-26 DIAGNOSIS — R0902 Hypoxemia: Secondary | ICD-10-CM | POA: Diagnosis not present

## 2017-07-26 DIAGNOSIS — G4733 Obstructive sleep apnea (adult) (pediatric): Secondary | ICD-10-CM

## 2017-07-26 DIAGNOSIS — E785 Hyperlipidemia, unspecified: Secondary | ICD-10-CM | POA: Insufficient documentation

## 2017-07-26 DIAGNOSIS — E119 Type 2 diabetes mellitus without complications: Secondary | ICD-10-CM | POA: Insufficient documentation

## 2017-07-26 DIAGNOSIS — M545 Low back pain: Secondary | ICD-10-CM | POA: Diagnosis not present

## 2017-07-26 LAB — ECHOCARDIOGRAM COMPLETE
HEIGHTINCHES: 70 in
Height: 70 in
WEIGHTICAEL: 3312 [oz_av]
Weight: 3312 oz

## 2017-07-26 MED ORDER — APIXABAN 5 MG PO TABS: 5.0000 mg | ORAL_TABLET | Freq: Two times a day (BID) | ORAL | 2 refills | Status: DC

## 2017-07-26 NOTE — Progress Notes (Signed)
Cardiology Office Note    Date:  07/26/2017  ID:  FERGUSON GERTNER, DOB 15-Mar-1955, MRN [id number] PCP:  [name], MD  Cardiologist:  Fransico Him, MD   Chief Complaint: atrial flutter  History of Present Illness:  Brandon Frank is a 62 y.o. male with history of sinus tachycardia, PACs, PVCs, severe OSA, HLD on Praluent, DM, chronic low back pain, family history of CAD, ?HTN (pt denies) who presents for evaluation of atrial flutter.  ETT in 2017 was normal. Event monitoring 2017 showed NSR with sinus tach with average HR 99bpm, ranging 74-131, occasional PVCs, bigeminal PVCs, occasional PACs, couplets, triplets, 2nd degree type 1 AV block for 1 beat during sleep. 2D echo 10/2013 showed mild LVH, EF 60-65%, grade 2 DD. Calcium scoring in 03/2016 was 97th percentile for age/sex matched control. F/u nuclear stress 03/2016 showed no reversible ischemia, LVEF 54% with normal wall motion, low risk. Last labs 07/13/17 showed normal CMET except glucose 160, Hgb 17.1 (?elevated for several years), plt 172, TSH wnl, A1C uncontrolled at 9.6. Lipids 03/2017 looked great with LDL of 47.   He recently saw PCP for regular medical followup and incidentally was found to be in rapid atrial flutter with HR in 130s.  PCP started him on Toprol 50mg  daily and discussed anticoagulation but patient declined. He was seen the following day and was NSR at 85bpm. On day of atrial flutter dx, he says he could feel his heart racing when at PCP's office but this has been a habitual occurrence over 2 years time so it wasn't actually something that concerned him (similar symptom prompted event monitor in 2017 as above without AF/AFL). CHADSVASC is at least 2 for DM and coronary calcification, possibly 3 for HTN although patient disputes this diagnosis, stating he's been on lisinopril in the past to "keep my blood pressure as low as possible." He wears an Apple watch and occasionally sees a spike in HR to the 160-180 range.  These are noted in retrospective review rather than checking during acute symptoms. He doesn't believe he was exercising during these times, but is very physically active out in the yard. He did an 11 mile bike ride the other day without any CP or SOB.He's largely been asymptomatic or at the very least unbothered by intermittent heart racing. His wife Jerilynn Mages (who works in our office) says she has noticed he's had SOB sometimes but he adamantly refutes this. He has occasional indigestion which resolves quickly with Tums and does not happen with exertion. He is going to be undergoing a routine colonoscopy 08/10/17 as well as a prostate bx for possible prostate CA 09/02/17. Denies h/o significant bleeding, TIA, stroke, CHF.  His pulse ox was noted to be anywhere from 90-94% at rest today. He did not desat further with exertion but instead went to 96%. He reports seeing pulmonology a long time ago for possible calcified nodes and being told perhaps he'd had histoplasmosis at one point.  Past Medical History:  Diagnosis Date  . Chronic low back pain   . Ejection fraction   . Family history of premature CAD 03/20/2015  . Hyperlipidemia   . Hypertension associated with diabetes ([place]) 04/01/2016  . Mobitz type 1 second degree atrioventricular block    a. Event monitor 2017 - 1 beat.  . Obstructive sleep apnea 12/06/2013   NPSG 11/14/13- Severe OSA, AHI 86/ hr with loud snore, desat ot 80%, titrated to CPAP 12, weight 215 lbs   .  Paroxysmal atrial flutter ([place])   . Premature atrial contractions   . PVC's (premature ventricular contractions)   . Sinus tachycardia 10/27/2013   Persistent mild sinus tachycardia, October, 2015   //   48 hour Holter, October, 2015, rare PACs and PVCs. Heart rate range 70-128, mean heart rate 95 during the daytime, mean heart rate 85 during the resting hours.   //   Patient was diagnosed with severe sleep apnea. C Pap was started and his resting sinus tachycardia improved greatly.     . Sinus tachycardia     Past Surgical History:  Procedure Laterality Date  . HERNIA REPAIR    . PROSTATE BIOPSY N/A   . rotator cuff surgery  2012   Dr. [name]    Current Medications: Current Meds  Medication Sig  . Alirocumab (PRALUENT) 150 MG/ML SOPN Inject 1 pen into the skin every 14 (fourteen) days.  . ANDROGEL PUMP 20.25 MG/ACT (1.62%) GEL 1 pump to both upper arm area twice daily (total 4 pumps daily) per Dr [name]  . atorvastatin (LIPITOR) 80 MG tablet Take 80 mg by mouth daily.  [name] BAYER MICROLET LANCETS lancets CHECK 2 TIMES DAILY  . CONTOUR NEXT TEST test strip USE TO TEST BLOOD SUGAR DAILY  . Dulaglutide (TRULICITY) 6.83 MH/9.6QI SOPN Inject 0.75 mg into the skin once a week.  . DULoxetine (CYMBALTA) 60 MG capsule Take 3 capsules (180 mg total) by mouth daily. Per Dr Candis Schatz  . empagliflozin (JARDIANCE) 10 MG TABS tablet Take 10 mg by mouth daily.  [name] ezetimibe (ZETIA) 10 MG tablet TAKE 1 TABLET BY MOUTH ONCE DAILY  . fluticasone (FLONASE) 50 MCG/ACT nasal spray USE 1 SPRAY(S) IN EACH NOSTRIL ONCE DAILY AS NEEDED FOR ALLERGIES OR RHINITIS  . gabapentin (NEURONTIN) 300 MG capsule Take 600 mg by mouth 2 (two) times daily.   [name] GAVILYTE-G 236 g solution Take 1 Dose by mouth See admin instructions.  [name] glucosamine-chondroitin 500-400 MG tablet Take 1 tablet by mouth daily.  [name] glucose blood test strip One touch verio.  Test once daily. Dx E11.9  . HYDROcodone-acetaminophen (NORCO/VICODIN) 5-325 MG tablet Take 1 tablet by mouth See admin instructions.  . Lancets (ONETOUCH ULTRASOFT) lancets One touch verio lancets.  Test once daily.  Dx e11.9  . lisinopril (PRINIVIL,ZESTRIL) 10 MG tablet TAKE 1 TABLET BY MOUTH ONCE DAILY  . LORazepam (ATIVAN) 0.5 MG tablet Take 1 tablet (0.5 mg total) by mouth as needed for anxiety.  . metFORMIN (GLUCOPHAGE) 500 MG tablet TAKE TWO TABLETS BY MOUTH TWICE DAILY WITH  A  MEAL  . metoprolol succinate (TOPROL-XL) 50 MG 24 hr tablet Take 1 tablet (50  mg total) by mouth daily. Take with or immediately following a meal.  . Multiple Vitamin (MULTIVITAMIN) tablet Take 1 tablet by mouth daily.    . tadalafil (CIALIS) 5 MG tablet Take 5 mg by mouth daily as needed for erectile dysfunction.  . zaleplon (SONATA) 5 MG capsule Take 1 capsule (5 mg total) by mouth at bedtime as needed for sleep. for sleep  . zolpidem (AMBIEN) 10 MG tablet TAKE ONE TABLET BY MOUTH AT BEDTIME AS NEEDED FOR SLEEP    Allergies:   Patient has no known allergies.   Social History   Socioeconomic History  . Marital status: Married    Spouse name: Not on file  . Number of children: Not on file  . Years of education: Not on file  . Highest education level: Not on file  Occupational  History  . Not on file  Social Needs  . Financial resource strain: Not on file  . Food insecurity:    Worry: Not on file    Inability: Not on file  . Transportation needs:    Medical: Not on file    Non-medical: Not on file  Tobacco Use  . Smoking status: Former Smoker    Last attempt to quit: 03/20/1975    Years since quitting: 42.3  . Smokeless tobacco: Never Used  Substance and Sexual Activity  . Alcohol use: Yes    Alcohol/week: 1.2 oz    Types: 2 Cans of beer per week    Comment: daily  . Drug use: No  . Sexual activity: Not on file  Lifestyle  . Physical activity:    Days per week: Not on file    Minutes per session: Not on file  . Stress: Not on file  Relationships  . Social connections:    Talks on phone: Not on file    Gets together: Not on file    Attends religious service: Not on file    Active member of club or organization: Not on file    Attends meetings of clubs or organizations: Not on file    Relationship status: Not on file  Other Topics Concern  . Not on file  Social History Narrative  . Not on file     Family History:  The patient's family history includes Diabetes in his brother and paternal grandfather; Heart disease (age of onset: 49) in his  father; Hyperlipidemia in his brother and father; Hypertension in his brother and father.  ROS:   Please see the history of present illness.  All other systems are reviewed and otherwise negative.    PHYSICAL EXAM:   VS:  BP 116/62   Pulse 95   Ht 5\' 10"  (1.778 m)   Wt 207 lb (93.9 kg)   SpO2 94%   BMI 29.70 kg/m   BMI: Body mass index is 29.7 kg/m. GEN: Well nourished, well developed WM, in no acute distress HEENT: normocephalic, atraumatic Neck: no JVD, carotid bruits, or masses Cardiac: RRR; no murmurs, rubs, or gallops, no edema  Respiratory:  clear to auscultation bilaterally, normal work of breathing GI: soft, nontender, nondistended, + BS MS: no deformity or atrophy Skin: warm and dry, no rash Neuro:  Alert and Oriented x 3, Strength and sensation are intact, follows commands Psych: euthymic mood, full affect  Wt Readings from Last 3 Encounters:  07/26/17 207 lb (93.9 kg)  07/14/17 203 lb 9.6 oz (92.4 kg)  07/13/17 203 lb 9.6 oz (92.4 kg)      Studies/Labs Reviewed:   EKG:  EKG was ordered today and personally reviewed by me and demonstrates NSR 95bpm, nonspeciifc TW flattening III, avF, V6, QTc 468ms  EKG 07/13/17 showed atrial flutter with variable conduction HR 133 BPM.  Recent Labs: 07/13/2017: ALT 34; BUN 15; Creatinine, Ser 0.82; Hemoglobin 17.1; Platelets 172.0; Potassium 4.0; Sodium 138; TSH 1.35   Lipid Panel    Component Value Date/Time   CHOL 114 03/22/2017 0804   TRIG 127 03/22/2017 0804   TRIG 88 11/19/2005 1012   HDL 42 03/22/2017 0804   CHOLHDL 2.7 03/22/2017 0804   CHOLHDL 4 10/01/2015 1104   VLDL 19.0 10/01/2015 1104   LDLCALC 47 03/22/2017 0804   LDLDIRECT 16 10/08/2016 0847   LDLDIRECT 168.2 10/29/2008 0813    Additional studies/ records that were reviewed today include: Summarized above.  ASSESSMENT & PLAN:   1. Paroxysmal atrial flutter - noted on EKG at PCP's office 07/14/17. He reports similar symptoms for 2 years,  including back to when prior event monitor showed NSR with PACs, PVCs. We discussed anticoagulation given his CHADSVASC of 2-3 and he is agreeable. He was initially wanting to defer due to upcoming colonoscopy/prostate biopsy but I told him his anticoagulation could be interrupted for these procedures. Risks/benefits reviewed. Will start Eliquis 5mg  BID. Plan repeat 30 day event monitor to further assess the other tachy bursts he's seeing on his Apple watch as this may represent additional atrial fib or SVT. Will also update 2D echocardiogram.  2. History of sinus tachycardia, PACs, PVCs - assess by event monitor as above. 3. Essential HTN - if event monitor confirms recurrence of tachycardia, would hold lisinopril to allow room for AVN blocking titration. 4. OSA on CPAP - continue CPAP. 5. Oxygen desaturation - somewhat unusual, O2 sat 90-94% on multiple fingers at rest today, went to 96% per tech with exertion. His Hgb has been elevated the last few years as well but he is a nonsmoker. He reports remotely being told of calcified nodes in his lungs. Will start with 2V CXR and PFTs. If unrevealing and borderline O2 sat persists, would refer back to pulm for evaluation.  Disposition: F/u with myself in 5 weeks.   Medication Adjustments/Labs and Tests Ordered: Current medicines are reviewed at length with the patient today.  Concerns regarding medicines are outlined above. Medication changes, Labs and Tests ordered today are summarized above and listed in the Patient Instructions accessible in Encounters.   Signed, Charlie Pitter, PA-C  07/26/2017 2:29 PM    [address], [place], [place]  [address] Phone: [phone]; Fax: [phone]

## 2017-07-26 NOTE — Patient Instructions (Addendum)
Medication Instructions:  Your physician has recommended you make the following change in your medication:  1. Start Eliquis (5mg ) twice a day. Sent in to patient's requested pharmacy.    Labwork: -None  Testing/Procedures: Your physician has requested that you have an echocardiogram. Echocardiography is a painless test that uses sound waves to create images of your heart. It provides your doctor with information about the size and shape of your heart and how well your heart's chambers and valves are working. This procedure takes approximately one hour. There are no restrictions for this procedure.  Your physician has recommended that you wear an event monitor. Event monitors are medical devices that record the heart's electrical activity. Doctors most often [place] these monitors to diagnose arrhythmias. Arrhythmias are problems with the speed or rhythm of the heartbeat. The monitor is a small, portable device. You can wear one while you do your normal daily activities. This is usually used to diagnose what is causing palpitations/syncope (passing out).  A chest x-ray takes a picture of the organs and structures inside the chest, including the heart, lungs, and blood vessels. This test can show several things, including, whether the heart is enlarges; whether fluid is building up in the lungs; and whether pacemaker / defibrillator leads are still in place.  Your physician has recommended that you have a pulmonary function test. Pulmonary Function Tests are a group of tests that measure how well air moves in and out of your lungs.    Follow-Up: Your physician recommends that you keep your scheduled  follow-up appointment with Dayna Dunn,PA   Any Other Special Instructions Will Be Listed Below (If Applicable).     If you need a refill on your cardiac medications before your next appointment, please call your pharmacy.

## 2017-07-27 ENCOUNTER — Encounter: Payer: Self-pay | Admitting: Physician Assistant

## 2017-07-27 ENCOUNTER — Encounter: Payer: Self-pay | Admitting: Family Medicine

## 2017-07-27 ENCOUNTER — Other Ambulatory Visit: Payer: Self-pay

## 2017-07-27 MED ORDER — CYCLOBENZAPRINE HCL 10 MG PO TABS: ORAL_TABLET | ORAL | 0 refills | Status: DC

## 2017-08-10 ENCOUNTER — Encounter (HOSPITAL_COMMUNITY): Admission: RE | Payer: Self-pay | Source: Ambulatory Visit

## 2017-08-10 ENCOUNTER — Ambulatory Visit (HOSPITAL_COMMUNITY)
Admission: RE | Admit: 2017-08-10 | Payer: BLUE CROSS/BLUE SHIELD | Source: Ambulatory Visit | Admitting: Gastroenterology

## 2017-08-10 SURGERY — COLONOSCOPY WITH PROPOFOL
Anesthesia: Monitor Anesthesia Care

## 2017-08-12 ENCOUNTER — Ambulatory Visit (HOSPITAL_COMMUNITY)
Admission: RE | Admit: 2017-08-12 | Discharge: 2017-08-12 | Disposition: A | Discharge status: Home / Self Care | Payer: BLUE CROSS/BLUE SHIELD | Source: Ambulatory Visit | Attending: Physician Assistant | Admitting: Physician Assistant

## 2017-08-12 ENCOUNTER — Inpatient Hospital Stay (HOSPITAL_COMMUNITY): Admission: RE | Admit: 2017-08-12 | Payer: BLUE CROSS/BLUE SHIELD | Source: Ambulatory Visit

## 2017-08-12 DIAGNOSIS — R0902 Hypoxemia: Secondary | ICD-10-CM

## 2017-08-12 DIAGNOSIS — G4733 Obstructive sleep apnea (adult) (pediatric): Secondary | ICD-10-CM

## 2017-08-12 DIAGNOSIS — Z9989 Dependence on other enabling machines and devices: Secondary | ICD-10-CM | POA: Diagnosis not present

## 2017-08-12 DIAGNOSIS — I4892 Unspecified atrial flutter: Secondary | ICD-10-CM | POA: Diagnosis not present

## 2017-08-12 DIAGNOSIS — I10 Essential (primary) hypertension: Secondary | ICD-10-CM

## 2017-08-12 DIAGNOSIS — R000 Tachycardia, unspecified: Secondary | ICD-10-CM

## 2017-08-12 LAB — PULMONARY FUNCTION TEST
DL/VA % pred: 103 %
DL/VA: 4.79 ml/min/mmHg/L
DLCO UNC % PRED: 93 %
DLCO UNC: 30.34 ml/min/mmHg
FEF 25-75 Pre: 2.85 L/sec
FEF2575-%PRED-PRE: 96 %
FEV1-%Pred-Pre: 94 %
FEV1-Pre: 3.42 L
FEV1FVC-%PRED-PRE: 101 %
FEV6-%Pred-Pre: 96 %
FEV6-PRE: 4.39 L
FEV6FVC-%Pred-Pre: 104 %
FVC-%Pred-Pre: 93 %
FVC-PRE: 4.44 L
PRE FEV6/FVC RATIO: 99 %
Pre FEV1/FVC ratio: 77 %

## 2017-08-13 ENCOUNTER — Telehealth: Payer: Self-pay | Admitting: *Deleted

## 2017-08-13 DIAGNOSIS — R0902 Hypoxemia: Secondary | ICD-10-CM

## 2017-08-13 NOTE — Telephone Encounter (Signed)
-----   Message from Brandon Frank, [place] sent at 08/12/2017  3:22 PM EDT ----- Please call patient. PFTs show Minimal Obstructive Airways Disease in Peripheral Airways. I would suggest we refer him to pulm to see if they can help figure out why O2 sat was borderline decreased in the office, since PFTs and CXR were generally unrevealing. FYI this is [name]'s husband. Dayna Dunn PA-C

## 2017-08-16 ENCOUNTER — Ambulatory Visit: Payer: BLUE CROSS/BLUE SHIELD

## 2017-08-16 ENCOUNTER — Other Ambulatory Visit: Payer: Self-pay | Admitting: Cardiology

## 2017-08-16 ENCOUNTER — Ambulatory Visit (INDEPENDENT_AMBULATORY_CARE_PROVIDER_SITE_OTHER): Payer: BLUE CROSS/BLUE SHIELD | Admitting: Family Medicine

## 2017-08-16 DIAGNOSIS — Z23 Encounter for immunization: Secondary | ICD-10-CM | POA: Diagnosis not present

## 2017-08-16 NOTE — Telephone Encounter (Signed)
Pt's pharmacy is requesting a refill on zaleplon 5 mg tablet. Would Dr. Radford Pax like to refill this medication? Please address

## 2017-08-19 ENCOUNTER — Encounter: Payer: Self-pay | Admitting: Family Medicine

## 2017-08-19 ENCOUNTER — Encounter: Payer: Self-pay | Admitting: Physician Assistant

## 2017-08-19 ENCOUNTER — Telehealth: Payer: Self-pay | Admitting: Cardiology

## 2017-08-19 DIAGNOSIS — I4892 Unspecified atrial flutter: Secondary | ICD-10-CM

## 2017-08-19 NOTE — Telephone Encounter (Signed)
Chart reviewed - patient recently diagnosed with paroxysmal atrial flutter but he reported having sx for many years. His apple watch was showing spikes so I suspect we captured one.  Please find out from [facility] if they see that patient is still in it. If currently asymptomatic, would recommend to cut lisinopril in half to 5mg  daily, increase metoprolol to 100mg  daily, and refer to afib clinic. If symptomatic -> ER.   Cala Kruckenberg PA-C

## 2017-08-19 NOTE — Telephone Encounter (Signed)
Checked with Shelly in monitors - no way to have [facility] check pt's rhythm without pt pressing the button.  Unsuccessfully attempted to contact patient.  Left message for him to press the button on his monitor when he gets the voicemail so that we can determine his rhythm.  Brandon Frank is going to follow up in the morning with Brandon Frank.  [name] is aware.

## 2017-08-19 NOTE — Telephone Encounter (Signed)
Received call from [facility] with [place].  Reports pt had a new onset of At Fib with HR 147 and felt dizzy last night at 8:19 pm.  Pt is on Eliquis 5 mg BID and Metoprolol succinate 50 mg.   Per Lelan Pons - information has been faxed to the office for review.

## 2017-08-19 NOTE — Telephone Encounter (Signed)
New message    [facility] calling with critical EKG result.

## 2017-08-20 MED ORDER — METOPROLOL SUCCINATE ER 100 MG PO TB24: 100.0000 mg | ORAL_TABLET | Freq: Every day | ORAL | 3 refills | Status: DC

## 2017-08-20 MED ORDER — LISINOPRIL 5 MG PO TABS: 5.0000 mg | ORAL_TABLET | Freq: Every day | ORAL | 3 refills | Status: DC

## 2017-08-20 NOTE — Telephone Encounter (Signed)
Received monitor strips from [facility]which demonstrated NSR HR 85 at 6:17 pm on 08/19/17 but At Fib HR 109 at 12:10 AM on 08/19/17.  Reviewed with Melina Copa who wants pt to decrease Lisinopril to 5 mg daily and increase Metoprolol to 100 mg a day.  Also referred pt is At [facility]. Reviewed information with patient who states understanding.   RXs sent into [facility] as requested.  Referral placed for Fib Clinic.  Aware someone will contact him to schedule.

## 2017-08-23 ENCOUNTER — Other Ambulatory Visit: Payer: Self-pay | Admitting: Cardiology

## 2017-08-23 NOTE — Telephone Encounter (Signed)
Pt's pharmacy [facility] is requesting a refill on Sonata 5 mg tablet. Would Dr. Radford Pax like to refill this medication/ please address

## 2017-08-23 NOTE — Telephone Encounter (Signed)
Ok to refill Sonata 5mg  qhs PRN for insomnia with  #15 tablets  with 2 refills

## 2017-08-24 MED ORDER — ZALEPLON 5 MG PO CAPS: ORAL_CAPSULE | ORAL | 2 refills | Status: DC

## 2017-08-24 NOTE — Telephone Encounter (Signed)
Medication refilled

## 2017-08-25 ENCOUNTER — Telehealth (HOSPITAL_COMMUNITY): Payer: Self-pay | Admitting: *Deleted

## 2017-08-25 NOTE — Telephone Encounter (Signed)
Pt's medication was phone in by Trey Sailors, RN, to pt's pharmacy as requested. Pharmacy tech verbalized understanding.

## 2017-08-25 NOTE — Telephone Encounter (Signed)
Left 2nd message for pt to clbk to sched per referral from Dr. Radford Pax

## 2017-08-26 ENCOUNTER — Telehealth: Payer: Self-pay | Admitting: Family Medicine

## 2017-08-26 DIAGNOSIS — E1165 Type 2 diabetes mellitus with hyperglycemia: Secondary | ICD-10-CM

## 2017-08-26 NOTE — Telephone Encounter (Signed)
Copied from [place] [phone]. Topic: Referral - Request >> Aug 26, 2017  2:08 PM Berneta Levins wrote: Reason for CRM:  Pt calling requesting a referral to endocrinology - Dr. Susette Racer ([address] [place], [place] phone: [phone]) Pt can be reached at [phone]

## 2017-08-27 ENCOUNTER — Other Ambulatory Visit: Payer: Self-pay | Admitting: Family Medicine

## 2017-08-27 NOTE — Telephone Encounter (Signed)
Dr. Elease Hashimoto,   Please advise on the request from this patient to be referred to endocrinology---Dr. Susette Racer in [place].  Patient was last seen by you on 07/14/2017.  Thank you.

## 2017-08-27 NOTE — Telephone Encounter (Signed)
OK to refer.

## 2017-08-30 NOTE — Telephone Encounter (Signed)
Referral has been placed and the patient was notified via my chart message.

## 2017-09-01 ENCOUNTER — Ambulatory Visit: Payer: BLUE CROSS/BLUE SHIELD | Admitting: Physician Assistant

## 2017-09-07 ENCOUNTER — Ambulatory Visit (HOSPITAL_COMMUNITY): Payer: BLUE CROSS/BLUE SHIELD | Admitting: Nurse Practitioner

## 2017-09-08 ENCOUNTER — Ambulatory Visit: Payer: BLUE CROSS/BLUE SHIELD | Admitting: Cardiology

## 2017-09-09 ENCOUNTER — Encounter (HOSPITAL_COMMUNITY): Payer: Self-pay | Admitting: Nurse Practitioner

## 2017-09-09 ENCOUNTER — Ambulatory Visit (HOSPITAL_COMMUNITY)
Admission: RE | Admit: 2017-09-09 | Discharge: 2017-09-09 | Disposition: A | Discharge status: Home / Self Care | Payer: BLUE CROSS/BLUE SHIELD | Source: Ambulatory Visit | Attending: Nurse Practitioner | Admitting: Nurse Practitioner

## 2017-09-09 VITALS — BP 122/76 | HR 108 | Ht 70.0 in | Wt 211.0 lb

## 2017-09-09 DIAGNOSIS — G609 Hereditary and idiopathic neuropathy, unspecified: Secondary | ICD-10-CM | POA: Insufficient documentation

## 2017-09-09 DIAGNOSIS — I119 Hypertensive heart disease without heart failure: Secondary | ICD-10-CM | POA: Diagnosis not present

## 2017-09-09 DIAGNOSIS — Z79899 Other long term (current) drug therapy: Secondary | ICD-10-CM | POA: Diagnosis not present

## 2017-09-09 DIAGNOSIS — E785 Hyperlipidemia, unspecified: Secondary | ICD-10-CM | POA: Insufficient documentation

## 2017-09-09 DIAGNOSIS — Z8249 Family history of ischemic heart disease and other diseases of the circulatory system: Secondary | ICD-10-CM | POA: Diagnosis not present

## 2017-09-09 DIAGNOSIS — E119 Type 2 diabetes mellitus without complications: Secondary | ICD-10-CM | POA: Insufficient documentation

## 2017-09-09 DIAGNOSIS — Z87891 Personal history of nicotine dependence: Secondary | ICD-10-CM | POA: Insufficient documentation

## 2017-09-09 DIAGNOSIS — I480 Paroxysmal atrial fibrillation: Secondary | ICD-10-CM | POA: Insufficient documentation

## 2017-09-09 DIAGNOSIS — G8929 Other chronic pain: Secondary | ICD-10-CM | POA: Diagnosis not present

## 2017-09-09 DIAGNOSIS — G4733 Obstructive sleep apnea (adult) (pediatric): Secondary | ICD-10-CM | POA: Diagnosis not present

## 2017-09-09 DIAGNOSIS — I4892 Unspecified atrial flutter: Secondary | ICD-10-CM | POA: Diagnosis not present

## 2017-09-09 DIAGNOSIS — C61 Malignant neoplasm of prostate: Secondary | ICD-10-CM | POA: Diagnosis not present

## 2017-09-09 DIAGNOSIS — M545 Low back pain: Secondary | ICD-10-CM | POA: Diagnosis not present

## 2017-09-09 DIAGNOSIS — Z833 Family history of diabetes mellitus: Secondary | ICD-10-CM | POA: Insufficient documentation

## 2017-09-09 DIAGNOSIS — Z7984 Long term (current) use of oral hypoglycemic drugs: Secondary | ICD-10-CM | POA: Diagnosis not present

## 2017-09-09 DIAGNOSIS — G4700 Insomnia, unspecified: Secondary | ICD-10-CM | POA: Insufficient documentation

## 2017-09-09 DIAGNOSIS — I441 Atrioventricular block, second degree: Secondary | ICD-10-CM | POA: Insufficient documentation

## 2017-09-10 NOTE — Progress Notes (Signed)
Primary Care Physician: [name], MD Referring Physician: Dr. Fuller Canada is a 62 y.o. male with a h/o paroxysmal atrial fib/flutterHTN, DM, OSA that is in the afib clinic for evaluation. Pt reports that he has had spells of irregular elevated  HR, for some time. He mostly is aware of the changes on his apple watch. He is currently wearing a 30 day event monitor which some strips  have recently shown afib. He is wearing the monitor as he recently saw PCP who found pt to be in flutter. He was then seen by Sharrell Ku, PA for further evaluation who placed the monitor. He has a week left. He was started on Eliquis 5 mg bid. He is currently off drug for pending back injection pending Friday. He just had to interrupt anticoagulation of prostate bx last week and just heard yesterday that he had prostate CA, and will need further treatment with possible implantation  of radioactive seeds. He is also due to have a colonoscopy soon. He also had an echo that showed severe focal basal hypertrophy. He states that his blood sugar is poorly controlled and he is pending consult with an Endocrinologist. He has a high calcium score.  Today, he denies symptoms of palpitations, chest pain, shortness of breath, orthopnea, PND, lower extremity edema, dizziness, presyncope, syncope, or neurologic sequela. The patient is tolerating medications without difficulties and is otherwise without complaint today.   Past Medical History:  Diagnosis Date  . Anxiety 02/03/2011  . Atrial fibrillation and flutter ([place]) 07/13/2017  . Chronic low back pain   . Ejection fraction   . Family history of premature CAD 03/20/2015  . Hereditary and idiopathic peripheral neuropathy 12/06/2006   Qualifier: Diagnosis of  By: [name], [name]    . Hyperlipidemia   . Hypertension associated with diabetes ([place]) 04/01/2016  . Infection of urinary tract 06/22/2015  . Insomnia 12/06/2006   Qualifier: Diagnosis of  By: [name],  [name]    . Low testosterone 10/07/2015  . Mobitz type 1 second degree atrioventricular block    a. Event monitor 2017 - 1 beat.  . Obstructive sleep apnea 12/06/2013   NPSG 11/14/13- Severe OSA, AHI 86/ hr with loud snore, desat ot 80%, titrated to CPAP 12, weight 215 lbs   . Overweight 10/23/2008   Qualifier: Diagnosis of  By: [name], MD, [name] [name]    . Paroxysmal atrial flutter ([place])   . Plantar fasciitis of right foot 10/21/2012  . Premature atrial contractions   . PVC's (premature ventricular contractions)   . Sinus tachycardia 10/27/2013   Persistent mild sinus tachycardia, October, 2015   //   48 hour Holter, October, 2015, rare PACs and PVCs. Heart rate range 70-128, mean heart rate 95 during the daytime, mean heart rate 85 during the resting hours.   //   Patient was diagnosed with severe sleep apnea. C Pap was started and his resting sinus tachycardia improved greatly.   . Sinus tachycardia   . TRANSAMINASES, SERUM, ELEVATED 11/30/2005   Qualifier: Diagnosis of  By: [name], [name]    . Type 2 diabetes mellitus, uncontrolled ([place]) 04/14/2012  . URI (upper respiratory infection) 06/22/2015   Past Surgical History:  Procedure Laterality Date  . HERNIA REPAIR    . PROSTATE BIOPSY N/A   . rotator cuff surgery  2012   Dr. [name]    Current Outpatient Medications  Medication Sig Dispense Refill  . Alirocumab (PRALUENT) 150  MG/ML SOPN Inject 1 pen into the skin every 14 (fourteen) days. 2 pen 11  . ANDROGEL PUMP 20.25 MG/ACT (1.62%) GEL 1 pump to both upper arm area twice daily (total 4 pumps daily) per Dr [name]    . apixaban (ELIQUIS) 5 MG TABS tablet Take 1 tablet (5 mg total) by mouth 2 (two) times daily. 180 tablet 2  . atorvastatin (LIPITOR) 80 MG tablet Take 80 mg by mouth daily.    [name] BAYER MICROLET LANCETS lancets CHECK 2 TIMES DAILY 200 each 2  . CONTOUR NEXT TEST test strip USE TO TEST BLOOD SUGAR DAILY 100 each 12  . cyclobenzaprine (FLEXERIL) 10 MG tablet Take 1  tablet by mouth at bed time as needed for muscle spasms. 30 tablet 0  . Dulaglutide (TRULICITY) 2.48 GN/0.0BB SOPN Inject 0.75 mg into the skin once a week. 2 mL 11  . DULoxetine (CYMBALTA) 60 MG capsule Take 3 capsules (180 mg total) by mouth daily. Per Dr Candis Schatz 90 capsule 1  . empagliflozin (JARDIANCE) 10 MG TABS tablet Take 10 mg by mouth daily. 90 tablet 3  . ezetimibe (ZETIA) 10 MG tablet TAKE 1 TABLET BY MOUTH ONCE DAILY 90 tablet 1  . fluticasone (FLONASE) 50 MCG/ACT nasal spray USE 1 SPRAY(S) IN EACH NOSTRIL ONCE DAILY AS NEEDED FOR ALLERGIES OR RHINITIS 16 g 2  . gabapentin (NEURONTIN) 300 MG capsule Take 600 mg by mouth 2 (two) times daily.   3  . GAVILYTE-G 236 g solution Take 1 Dose by mouth See admin instructions.  0  . glucosamine-chondroitin 500-400 MG tablet Take 1 tablet by mouth daily. 90 tablet 2  . glucose blood test strip One touch verio.  Test once daily. Dx E11.9 100 each 12  . HYDROcodone-acetaminophen (NORCO/VICODIN) 5-325 MG tablet Take 1 tablet by mouth See admin instructions.  0  . Lancets (ONETOUCH ULTRASOFT) lancets One touch verio lancets.  Test once daily.  Dx e11.9 100 each 12  . lisinopril (PRINIVIL,ZESTRIL) 5 MG tablet Take 1 tablet (5 mg total) by mouth daily. 90 tablet 3  . LORazepam (ATIVAN) 0.5 MG tablet Take 1 tablet (0.5 mg total) by mouth as needed for anxiety. 30 tablet 5  . metFORMIN (GLUCOPHAGE) 500 MG tablet TAKE TWO TABLETS BY MOUTH TWICE DAILY WITH  A  MEAL 360 tablet 1  . metoprolol succinate (TOPROL-XL) 100 MG 24 hr tablet Take 1 tablet (100 mg total) by mouth daily. Take with or immediately following a meal. 90 tablet 3  . Multiple Vitamin (MULTIVITAMIN) tablet Take 1 tablet by mouth daily.      . tadalafil (CIALIS) 5 MG tablet Take 5 mg by mouth daily as needed for erectile dysfunction.    . zaleplon (SONATA) 5 MG capsule TAKE 1 CAPSULE BY MOUTH AT BEDTIME AS NEEDED FOR SLEEP 15 capsule 2  . zolpidem (AMBIEN) 10 MG tablet TAKE ONE TABLET BY  MOUTH AT BEDTIME AS NEEDED FOR SLEEP 30 tablet 2   No current facility-administered medications for this encounter.     No Known Allergies  Social History   Socioeconomic History  . Marital status: Married    Spouse name: Not on file  . Number of children: Not on file  . Years of education: Not on file  . Highest education level: Not on file  Occupational History  . Not on file  Social Needs  . Financial resource strain: Not on file  . Food insecurity:    Worry: Not on file  Inability: Not on file  . Transportation needs:    Medical: Not on file    Non-medical: Not on file  Tobacco Use  . Smoking status: Former Smoker    Last attempt to quit: 03/20/1975    Years since quitting: 42.5  . Smokeless tobacco: Never Used  Substance and Sexual Activity  . Alcohol use: Yes    Alcohol/week: 2.0 standard drinks    Types: 2 Cans of beer per week    Comment: daily  . Drug use: No  . Sexual activity: Not on file  Lifestyle  . Physical activity:    Days per week: Not on file    Minutes per session: Not on file  . Stress: Not on file  Relationships  . Social connections:    Talks on phone: Not on file    Gets together: Not on file    Attends religious service: Not on file    Active member of club or organization: Not on file    Attends meetings of clubs or organizations: Not on file    Relationship status: Not on file  . Intimate partner violence:    Fear of current or ex partner: Not on file    Emotionally abused: Not on file    Physically abused: Not on file    Forced sexual activity: Not on file  Other Topics Concern  . Not on file  Social History Narrative  . Not on file    Family History  Problem Relation Age of Onset  . Heart disease Father 68       MI  . Hyperlipidemia Father   . Hypertension Father   . Diabetes Brother   . Hypertension Brother   . Hyperlipidemia Brother   . Diabetes Paternal Grandfather     ROS- All systems are reviewed and negative  except as per the HPI above  Physical Exam: Vitals:   09/09/17 1118  BP: 122/76  Pulse: (!) 108  Weight: 95.7 kg  Height: 5\' 10"  (1.778 m)   Wt Readings from Last 3 Encounters:  09/09/17 95.7 kg  07/26/17 93.9 kg  07/14/17 92.4 kg    Labs: Lab Results  Component Value Date   NA 138 07/13/2017   K 4.0 07/13/2017   CL 100 07/13/2017   CO2 24 07/13/2017   GLUCOSE 160 (H) 07/13/2017   BUN 15 07/13/2017   CREATININE 0.82 07/13/2017   CALCIUM 9.6 07/13/2017   No results found for: INR Lab Results  Component Value Date   CHOL 114 03/22/2017   HDL 42 03/22/2017   LDLCALC 47 03/22/2017   TRIG 127 03/22/2017     GEN- The patient is well appearing, alert and oriented x 3 today.   Head- normocephalic, atraumatic Eyes-  Sclera clear, conjunctiva pink Ears- hearing intact Oropharynx- clear Neck- supple, no JVP Lymph- no cervical lymphadenopathy Lungs- Clear to ausculation bilaterally, normal work of breathing Heart-irregular rate and rhythm, no murmurs, rubs or gallops, PMI not laterally displaced GI- soft, NT, ND, + BS Extremities- no clubbing, cyanosis, or edema MS- no significant deformity or atrophy Skin- no rash or lesion Psych- euthymic mood, full affect Neuro- strength and sensation are intact  EKG-afib at 108 bpm  Echo- 07/26/17-Study Conclusions  - Left ventricle: The cavity size was normal. There was severe   focal basal hypertrophy. Systolic function was normal. The   estimated ejection fraction was in the range of 60% to 65%. Wall   motion was normal; there were no  regional wall motion   abnormalities. Left ventricular diastolic function parameters   were normal. - Pulmonary arteries: Systolic pressure could not be accurately   estimated.  Assessment and Plan: 1. Paroxysmal afib/flutter General education re afib Triggers discussed Pt states that this has been going on for years He is on BB and strips show reasonable rate control He states that he  is not symptomatic other than showing elevations in his HR by Apple watch He has one week longer to wear event monitor Unfortunately, he has had 2 recent interruptions in his anticoagulation, is off currently for back injections and will probably need to interrupt in the near future for further treatment of his prostate CA I think this is not the time to discuss antiarrythmic's due the uncertainty of his prostate treatment, and he has not been on anticoagulation for 3 weeks uninterrupted.  Chadsvasc score of 2-3, continue xarelto 20 mg qd when it can be restarted With high calcium score and severe LVH, he may be looking at Tikosyn or sotalol and he states that his rhythm issues are the least of his problems right now I would also like to look at the 30 day monitor results when available to determine his afib burden  For now, pt will get back on his anticoagulation when he can, continue with BB and  will see back in 6 weeks. Hopefully, the uncertainty of his prostate treatment will be know and can proceed form there  [name] C. Carroll, [facility] [address] [place], [place] [address] [phone]

## 2017-09-24 ENCOUNTER — Encounter: Payer: Self-pay | Admitting: Physician Assistant

## 2017-09-24 ENCOUNTER — Telehealth: Payer: Self-pay | Admitting: Physician Assistant

## 2017-09-24 NOTE — Progress Notes (Signed)
Cardiology Office Note    Date:  09/27/2017  ID:  PERSEUS Brandon Frank, DOB [birth year], MRN [id number] PCP:  Brandon Post, MD  Cardiologist:  Brandon Him, MD   Chief Complaint: f/u atrial fib/flutter  History of Present Illness:  Brandon Frank is a 62 y.o. male with history of sinus tachycardia, PACs, PVCs, severe OSA, elevated calcium score, HLD on Praluent, DM, chronic low back pain, family history of CAD, ?HTN (pt denies), paroxysmal atrial flutter/fibrillation, recently diagnosed prostate CA who presents for f/u of atrial flutter.  He is the husband of one of our PRN CMAs, Brandon Frank. To recap Mr. Brandon Frank's history, ETT in 2017 was normal. Event monitoring 2017 showed NSR with sinus tach with average HR 99bpm, ranging 74-131, occasional PVCs, bigeminal PVCs, occasional PACs, couplets, triplets, and 2nd degree type 1 AV block for 1 beat during sleep. 2D echo 10/2013 showed mild LVH, EF 60-65%, grade 2 DD. Calcium scoring in 03/2016 was 97th percentile for age/sex matched control. F/u nuclear stress 03/2016 showed no reversible ischemia, LVEF 54% with normal wall motion, low risk. He recently saw PCP for regular medical followup and incidentally was found to be in rapid atrial flutter with HR in 130s.  PCP started Frank on Toprol 50mg  daily and discussed anticoagulation but patient declined. He was seen the following day and was NSR at 85bpm. On day of atrial flutter dx, he says he could feel his heart racing when at PCP's office but this has been a habitual occurrence over 2 years time so it wasn't actually something that concerned Frank (similar symptom prompted event monitor in 2017 as above without AF/AFL). Eliquis was started.   Of note, his pulse ox was noted to be anywhere from 90-94% at rest. He did not desat further with exertion but instead went to 96%. He bike rides regularly without limitation (89miles). He reports seeing pulmonology a long time ago for calcified nodules and being told perhaps  he'd had histoplasmosis at one point. PFTs only showed minimal obstructive airways disease. CXR was normal. We recommended referral back to pulmonology.2D echo 07/26/17 showed severe focal basal hypertrophy, EF 46-27%, normal diastolic parameters, could not estimate PASP. I reviewed the LVH with Dr. Radford Pax and she did not feel this was of significant concern. Last labs 07/13/17 showed normal CMET except glucose 160, Hgb 17.1 (?elevated for several years), plt 172, TSH wnl, A1C uncontrolled at 9.6. Lipids 03/2017 looked great with LDL of 47. His event monitor showed episodes of atrial fib. He did have to interrupt anticoagulation for back injections and eval of his prostate CA, so [name] w/ afib clinic felt it would not be a good time to visit antiarrhythmic therapy. It was felt given his high calcium score and severe LVH, he would be looking at Tikosyn or sotalol to help guide.  He returns for follow-up overall doing well from cardiac standpoint. Does not really feel his episodes of atrial fib/flutter. Event monitor end of service report does not yet convey the exact burden in a percentage form, but it appears he had 4 episodes of both atrial fib and flutter while wearing this (and had episodes of dizziness while in NSR). His biggest complaint is back pain. He will be having another back injection on Friday 9/13. He continues to bike and hike regularly.   Past Medical History:  Diagnosis Date  . Anxiety 02/03/2011  . Atrial fibrillation and flutter ([place]) 07/13/2017  . Chronic low back pain   . Family history  of premature CAD 03/20/2015  . Hereditary and idiopathic peripheral neuropathy 12/06/2006   Qualifier: Diagnosis of  By: Brandon Chang MD, Brandon Frank    . Hyperlipidemia   . Hypertension associated with diabetes ([place]) 04/01/2016  . Infection of urinary tract 06/22/2015  . Insomnia 12/06/2006   Qualifier: Diagnosis of  By: Brandon Chang MD, Brandon Frank    . Low testosterone 10/07/2015  . Mobitz type 1 second degree  atrioventricular block    a. Event monitor 2017 - 1 beat.  . Obstructive sleep apnea 12/06/2013   NPSG 11/14/13- Severe OSA, AHI 86/ hr with loud snore, desat ot 80%, titrated to CPAP 12, weight 215 lbs   . Overweight 10/23/2008   Qualifier: Diagnosis of  By: Brandon Parker, MD, Brandon Frank Brandon Frank    . Paroxysmal atrial flutter ([place])   . Plantar fasciitis of right foot 10/21/2012  . Premature atrial contractions   . Prostate cancer ([place])   . PVC's (premature ventricular contractions)   . Sinus tachycardia 10/27/2013   Persistent mild sinus tachycardia, October, 2015   //   48 hour Holter, October, 2015, rare PACs and PVCs. Heart rate range 70-128, mean heart rate 95 during the daytime, mean heart rate 85 during the resting hours.   //   Patient was diagnosed with severe sleep apnea. C Pap was started and his resting sinus tachycardia improved greatly.   . TRANSAMINASES, SERUM, ELEVATED 11/30/2005   Qualifier: Diagnosis of  By: Brandon Chang MD, Brandon Frank    . Type 2 diabetes mellitus, uncontrolled ([place]) 04/14/2012  . URI (upper respiratory infection) 06/22/2015    Past Surgical History:  Procedure Laterality Date  . HERNIA REPAIR    . PROSTATE BIOPSY N/A   . rotator cuff surgery  2012   Dr. Veverly Frank    Current Medications: Current Meds  Medication Sig  . Alirocumab (PRALUENT) 150 MG/ML SOPN Inject 1 pen into the skin every 14 (fourteen) days.  . ANDROGEL PUMP 20.25 MG/ACT (1.62%) GEL 1 pump to both upper arm area twice daily (total 4 pumps daily) per Dr Brandon Frank  . apixaban (ELIQUIS) 5 MG TABS tablet Take 1 tablet (5 mg total) by mouth 2 (two) times daily.  Brandon Frank atorvastatin (LIPITOR) 80 MG tablet Take 80 mg by mouth daily.  Brandon Frank BAYER MICROLET LANCETS lancets CHECK 2 TIMES DAILY  . CONTOUR NEXT TEST test strip USE TO TEST BLOOD SUGAR DAILY  . cyclobenzaprine (FLEXERIL) 10 MG tablet Take 1 tablet by mouth at bed time as needed for muscle spasms.  . Dulaglutide (TRULICITY) 6.22 QJ/3.3LK SOPN Inject 0.75 mg into the  skin once a week.  . DULoxetine (CYMBALTA) 60 MG capsule Take 3 capsules (180 mg total) by mouth daily. Per Dr Candis Schatz (Patient taking differently: Take 150 mg by mouth daily. Per Dr Candis Schatz 3 tabs one day then 2 the next day and so on.)  . empagliflozin (JARDIANCE) 10 MG TABS tablet Take 10 mg by mouth daily.  Brandon Frank ezetimibe (ZETIA) 10 MG tablet TAKE 1 TABLET BY MOUTH ONCE DAILY  . gabapentin (NEURONTIN) 300 MG capsule Take 500 mg by mouth 2 (two) times daily.   Brandon Frank GAVILYTE-G 236 g solution Take 1 Dose by mouth See admin instructions.  Brandon Frank glucose blood test strip One touch verio.  Test once daily. Dx E11.9  . Lancets (ONETOUCH ULTRASOFT) lancets One touch verio lancets.  Test once daily.  Dx e11.9  . lisinopril (PRINIVIL,ZESTRIL) 5 MG tablet Take 1 tablet (5 mg total) by mouth daily.  Brandon Frank LORazepam (ATIVAN)  0.5 MG tablet Take 1 tablet (0.5 mg total) by mouth as needed for anxiety.  . metFORMIN (GLUCOPHAGE) 500 MG tablet TAKE TWO TABLETS BY MOUTH TWICE DAILY WITH  A  MEAL  . metoprolol succinate (TOPROL-XL) 100 MG 24 hr tablet Take 1 tablet (100 mg total) by mouth daily. Take with or immediately following a meal.  . tadalafil (CIALIS) 5 MG tablet Take 5 mg by mouth daily.   . zaleplon (SONATA) 5 MG capsule TAKE 1 CAPSULE BY MOUTH AT BEDTIME AS NEEDED FOR SLEEP  . zolpidem (AMBIEN) 10 MG tablet TAKE ONE TABLET BY MOUTH AT BEDTIME AS NEEDED FOR SLEEP      Allergies:   Patient has no known allergies.   Social History   Socioeconomic History  . Marital status: Married    Spouse name: Not on file  . Number of children: Not on file  . Years of education: Not on file  . Highest education level: Not on file  Occupational History  . Not on file  Social Needs  . Financial resource strain: Not on file  . Food insecurity:    Worry: Not on file    Inability: Not on file  . Transportation needs:    Medical: Not on file    Non-medical: Not on file  Tobacco Use  . Smoking status: Former Smoker      Last attempt to quit: 03/20/1975    Years since quitting: 42.5  . Smokeless tobacco: Never Used  Substance and Sexual Activity  . Alcohol use: Yes    Alcohol/week: 2.0 standard drinks    Types: 2 Cans of beer per week    Comment: daily  . Drug use: No  . Sexual activity: Not on file  Lifestyle  . Physical activity:    Days per week: Not on file    Minutes per session: Not on file  . Stress: Not on file  Relationships  . Social connections:    Talks on phone: Not on file    Gets together: Not on file    Attends religious service: Not on file    Active member of club or organization: Not on file    Attends meetings of clubs or organizations: Not on file    Relationship status: Not on file  Other Topics Concern  . Not on file  Social History Narrative  . Not on file     Family History:  The patient's family history includes Diabetes in his brother and paternal grandfather; Heart disease (age of onset: 40) in his father; Hyperlipidemia in his brother and father; Hypertension in his brother and father.  ROS:   Please see the history of present illness. + dx of prostate CA, sees onc today.  All other systems are reviewed and otherwise negative.    PHYSICAL EXAM:   VS:  BP 100/64   Pulse 72   Ht 5\' 10"  (1.778 m)   Wt 210 lb 12.8 oz (95.6 kg)   SpO2 98%   BMI 30.25 kg/m   BMI: Body mass index is 30.25 kg/m. GEN: Well nourished, well developed WM, in no acute distress HEENT: normocephalic, atraumatic Neck: no JVD, carotid bruits, or masses Cardiac: RRR; no murmurs, rubs, or gallops, no edema  Respiratory:  clear to auscultation bilaterally, normal work of breathing GI: soft, nontender, nondistended, + BS MS: no deformity or atrophy Skin: warm and dry, no rash, suntanned complexion Neuro:  Alert and Oriented x 3, Strength and sensation are intact, follows  commands Psych: euthymic mood, full affect  Wt Readings from Last 3 Encounters:  09/27/17 210 lb 12.8 oz (95.6  kg)  09/09/17 211 lb (95.7 kg)  07/26/17 207 lb (93.9 kg)      Studies/Labs Reviewed:   EKG:  EKG was ordered today and personally reviewed by me and demonstrates NSR 72bpm, TWI III, benign appearing. QTc 366ms.   Recent Labs: 07/13/2017: ALT 34; BUN 15; Creatinine, Ser 0.82; Hemoglobin 17.1; Platelets 172.0; Potassium 4.0; Sodium 138; TSH 1.35   Lipid Panel    Component Value Date/Time   CHOL 114 03/22/2017 0804   TRIG 127 03/22/2017 0804   TRIG 88 11/19/2005 1012   HDL 42 03/22/2017 0804   CHOLHDL 2.7 03/22/2017 0804   CHOLHDL 4 10/01/2015 1104   VLDL 19.0 10/01/2015 1104   LDLCALC 47 03/22/2017 0804   LDLDIRECT 16 10/08/2016 0847   LDLDIRECT 168.2 10/29/2008 0813    Additional studies/ records that were reviewed today include: Summarized above    ASSESSMENT & PLAN:   1. Paroxysmal atrial fib/flutter - per monitor report, after titration of beta blocker, HRs weren't as high when out of rhythm. He is relatively asymptomatic now. Will continue current regimen. He reports he is having back injection on Friday. Per d/w pharmacy, OK to hold Eliquis 3 days prior to injection. CHADSVASC is ?2-3 (DM, vascular calcification, and possible HTN although pt denies h/o HTN). Since he is doing well on current regimen, does not wish to pursue antiarrhythmic therapy at present time so will cancel afib clinic f/u for now. Update CBC/BMET since started on Eliquis earlier this summer. He does report some orthostatic type dizziness at times. Since we increased the metoprolol, will drop lisinopril to 2.5mg  daily. 2. Abnormality of oxygen level - unclear etiology. This is normal today. Will keep pulm f/u as planned given reports of prior nodules on imaging. Recent CXR benign. 3. Elevated coronary calcium score - continue surveillance for anginal symptoms. Not on ASA given concomitant Eliquis. He ran out of Praluent as he lost the injection. Encouraged Frank to discuss with pharmacy. Anticipate lipids  can be checked at f/u OV 03/2017. 4. LVH - d/w Dr. Radford Pax as above, continue to monitor.  Disposition: F/u with Dr. Radford Pax in 6 months.   Medication Adjustments/Labs and Tests Ordered: Current medicines are reviewed at length with the patient today.  Concerns regarding medicines are outlined above. Medication changes, Labs and Tests ordered today are summarized above and listed in the Patient Instructions accessible in Encounters.   Signed, Charlie Pitter, PA-C  09/27/2017 9:45 AM    [facility] [place], [place], [place]  [address] Phone: [phone]; Fax: [phone]

## 2017-09-24 NOTE — Progress Notes (Signed)
GU Location of Tumor / Histology: prostatic adenocarcinoma  If Prostate Cancer, Gleason Score is (3 + 4) and PSA is (4.93) on 06/01/2017. Prostate volume: 40 grams.  Brandon Frank has a history of hypogonadism and previous treatment with Androgel. In August DRE revealed a symmetry of of prostate right greater than left. Originally a patient of Dr. Risa Grill. When [facility] transitioned out [place], [place] assumed care of patient before passing him onto Dr. Lovena Neighbours. Patient now sees Dr. Gloriann Loan.   Biopsies of prostate (if applicable) revealed:    Past/Anticipated interventions by urology, if any: Androgel, Cialis, prostate biopsy, referral to radiation oncology   Past/Anticipated interventions by medical oncology, if any: no  Weight changes, if any: no  Bowel/Bladder complaints, if any: IPSS 14. SHIM 19. Denies dysuria. Reports hematuria associated with biopsy resolved a few days ago. Denies urinary incontinence or leakage.   Nausea/Vomiting, if any: no  Pain issues, if any:  Chronic pain associated with bulging disc.   SAFETY ISSUES:  Prior radiation? no  Pacemaker/ICD? no  Possible current pregnancy? no  Is the patient on methotrexate? no  Current Complaints / other details:  62 year old male. Married. Recently diagnosed with atrial fib and flutter, sleep apnea, and high cholesterol. Requesting a repeat PSA.

## 2017-09-24 NOTE — Telephone Encounter (Signed)
Spoke to patient and confirmed his OV for Monday 9/9.  The Event monitor has resulted and will be discussed further at appt.

## 2017-09-24 NOTE — Telephone Encounter (Signed)
New Message   Patient is inquiring about his upcoming appt. He wants to know if his event monitor results are not back is it worth him coming to his appointment on Monday. Please call.

## 2017-09-27 ENCOUNTER — Other Ambulatory Visit: Payer: Self-pay | Admitting: Gastroenterology

## 2017-09-27 ENCOUNTER — Ambulatory Visit
Admission: RE | Admit: 2017-09-27 | Discharge: 2017-09-27 | Disposition: A | Discharge status: Home / Self Care | Payer: BLUE CROSS/BLUE SHIELD | Source: Ambulatory Visit | Attending: Radiation Oncology | Admitting: Radiation Oncology

## 2017-09-27 ENCOUNTER — Encounter: Payer: Self-pay | Admitting: Radiation Oncology

## 2017-09-27 ENCOUNTER — Telehealth: Payer: Self-pay | Admitting: Physician Assistant

## 2017-09-27 ENCOUNTER — Encounter: Payer: Self-pay | Admitting: Physician Assistant

## 2017-09-27 ENCOUNTER — Ambulatory Visit (INDEPENDENT_AMBULATORY_CARE_PROVIDER_SITE_OTHER): Payer: BLUE CROSS/BLUE SHIELD | Admitting: Physician Assistant

## 2017-09-27 ENCOUNTER — Other Ambulatory Visit: Payer: Self-pay

## 2017-09-27 VITALS — BP 120/77 | HR 79 | Temp 97.8°F | Resp 20 | Ht 70.0 in | Wt 209.8 lb

## 2017-09-27 VITALS — BP 100/64 | HR 72 | Ht 70.0 in | Wt 210.8 lb

## 2017-09-27 DIAGNOSIS — E1169 Type 2 diabetes mellitus with other specified complication: Secondary | ICD-10-CM | POA: Insufficient documentation

## 2017-09-27 DIAGNOSIS — I10 Essential (primary) hypertension: Secondary | ICD-10-CM | POA: Insufficient documentation

## 2017-09-27 DIAGNOSIS — I4892 Unspecified atrial flutter: Secondary | ICD-10-CM

## 2017-09-27 DIAGNOSIS — E291 Testicular hypofunction: Secondary | ICD-10-CM

## 2017-09-27 DIAGNOSIS — I517 Cardiomegaly: Secondary | ICD-10-CM

## 2017-09-27 DIAGNOSIS — I4891 Unspecified atrial fibrillation: Secondary | ICD-10-CM

## 2017-09-27 DIAGNOSIS — E785 Hyperlipidemia, unspecified: Secondary | ICD-10-CM | POA: Insufficient documentation

## 2017-09-27 DIAGNOSIS — Z7901 Long term (current) use of anticoagulants: Secondary | ICD-10-CM | POA: Diagnosis not present

## 2017-09-27 DIAGNOSIS — Z79899 Other long term (current) drug therapy: Secondary | ICD-10-CM | POA: Insufficient documentation

## 2017-09-27 DIAGNOSIS — Z87891 Personal history of nicotine dependence: Secondary | ICD-10-CM | POA: Diagnosis not present

## 2017-09-27 DIAGNOSIS — R7981 Abnormal blood-gas level: Secondary | ICD-10-CM

## 2017-09-27 DIAGNOSIS — C61 Malignant neoplasm of prostate: Secondary | ICD-10-CM | POA: Insufficient documentation

## 2017-09-27 DIAGNOSIS — R931 Abnormal findings on diagnostic imaging of heart and coronary circulation: Secondary | ICD-10-CM

## 2017-09-27 DIAGNOSIS — Z7984 Long term (current) use of oral hypoglycemic drugs: Secondary | ICD-10-CM | POA: Insufficient documentation

## 2017-09-27 LAB — BASIC METABOLIC PANEL
BUN/Creatinine Ratio: 19 (ref 10–24)
BUN: 18 mg/dL (ref 8–27)
CALCIUM: 10.4 mg/dL — AB (ref 8.6–10.2)
CHLORIDE: 98 mmol/L (ref 96–106)
CO2: 22 mmol/L (ref 20–29)
Creatinine, Ser: 0.96 mg/dL (ref 0.76–1.27)
GFR calc non Af Amer: 86 mL/min/{1.73_m2} (ref >59)
GFR, EST AFRICAN AMERICAN: 99 mL/min/{1.73_m2} (ref >59)
Glucose: 138 mg/dL — ABNORMAL HIGH (ref 65–99)
POTASSIUM: 4.4 mmol/L (ref 3.5–5.2)
SODIUM: 140 mmol/L (ref 134–144)

## 2017-09-27 LAB — CBC
HEMOGLOBIN: 17.4 g/dL (ref 13.0–17.7)
Hematocrit: 49.8 % (ref 37.5–51.0)
MCH: 32.8 pg (ref 26.6–33.0)
MCHC: 34.9 g/dL (ref 31.5–35.7)
MCV: 94 fL (ref 79–97)
PLATELETS: 174 10*3/uL (ref 150–450)
RBC: 5.31 x10E6/uL (ref 4.14–5.80)
RDW: 13.6 % (ref 12.3–15.4)
WBC: 5.7 10*3/uL (ref 3.4–10.8)

## 2017-09-27 MED ORDER — LISINOPRIL 2.5 MG PO TABS: 2.5000 mg | ORAL_TABLET | Freq: Every day | ORAL | 3 refills | Status: DC

## 2017-09-27 NOTE — Progress Notes (Signed)
Radiation Oncology         [phone] ________________________________  Initial outpatient Consultation  Name: Brandon Frank MRN: [id number]  Date: 09/27/2017  DOB: [birth year]  XK:GYJEHUDJS, [name], MD  Davis Gourd*   REFERRING PHYSICIAN: Davis Gourd*  DIAGNOSIS: 62 y.o. gentleman with T1c adenocarcinoma of the prostate with a Gleason score of 3+4 and a PSA of 4.93    ICD-10-CM   1. Malignant neoplasm of prostate ([place]) [facility] referral to Social Work    CANCELED: PSA    HISTORY OF PRESENT ILLNESS: Brandon Frank is a 62 y.o. male with a recent diagnosis of prostate cancer, seen at the request of Drs. Winter and Bell. The patient was noted to have an elevated PSA of 5.29 by his urologist, Dr. Lovena Neighbours on 05/25/17. He was a prior patient of Dr. Risa Grill and has been on testosterone replacement for several years. His PSA has fluctuated over the past few years between 3 and 5, and when he was seen by Dr. Lovena Neighbours for his routine visit, physical examination was performed at that time revealing a larger right lobe of the prostate. No nodules were noted. The patient proceeded to transrectal ultrasound with 12 biopsies of the prostate on 09/02/17.  The prostate volume measured 40 cc.  Out of 12 core biopsies, 2 were positive.  The maximum Gleason score was 3+4=7, and this was seen in left apex and left lateral apex. The right mid was also noted for focal high grade prostatic intraepithelial neoplasia.  He previously underwent a prostate biopsy on 09/27/12, and pathology reports showed benign tissue. He also met with Dr. Gloriann Loan and discussed his interest in remaining on testosterone replacement for quality of life purposes and symptoms of hypogonadism, but was told he would not prescribe this for the patient.  The patient reviewed the biopsy results with his urologist and he has kindly been referred today for discussion of potential radiation treatment options.   PREVIOUS  RADIATION THERAPY: No  PAST MEDICAL HISTORY:  Past Medical History:  Diagnosis Date  . Anxiety 02/03/2011  . Atrial fibrillation and flutter ([place]) 07/13/2017  . Chronic low back pain   . Family history of premature CAD 03/20/2015  . Hereditary and idiopathic peripheral neuropathy 12/06/2006   Qualifier: Diagnosis of  By: [name], [name]    . Hyperlipidemia   . Hypertension associated with diabetes ([place]) 04/01/2016  . Infection of urinary tract 06/22/2015  . Insomnia 12/06/2006   Qualifier: Diagnosis of  By: [name], [name]    . Low testosterone 10/07/2015  . Mobitz type 1 second degree atrioventricular block    a. Event monitor 2017 - 1 beat.  . Obstructive sleep apnea 12/06/2013   NPSG 11/14/13- Severe OSA, AHI 86/ hr with loud snore, desat ot 80%, titrated to CPAP 12, weight 215 lbs   . Overweight 10/23/2008   Qualifier: Diagnosis of  By: [name], MD, [name] [name]    . Paroxysmal atrial flutter ([place])   . Plantar fasciitis of right foot 10/21/2012  . Premature atrial contractions   . Prostate cancer ([place])   . PVC's (premature ventricular contractions)   . Sinus tachycardia 10/27/2013   Persistent mild sinus tachycardia, October, 2015   //   48 hour Holter, October, 2015, rare PACs and PVCs. Heart rate range 70-128, mean heart rate 95 during the daytime, mean heart rate 85 during the resting hours.   //   Patient was diagnosed with severe sleep apnea. C  Pap was started and his resting sinus tachycardia improved greatly.   . TRANSAMINASES, SERUM, ELEVATED 11/30/2005   Qualifier: Diagnosis of  By: [name], [name]    . Type 2 diabetes mellitus, uncontrolled ([place]) 04/14/2012  . URI (upper respiratory infection) 06/22/2015      PAST SURGICAL HISTORY: Past Surgical History:  Procedure Laterality Date  . HERNIA REPAIR    . PROSTATE BIOPSY N/A   . rotator cuff surgery  2012   Dr. [name]    FAMILY HISTORY:  Family History  Problem Relation Age of Onset  . Heart disease Father 51        MI  . Hyperlipidemia Father   . Hypertension Father   . Diabetes Brother   . Hypertension Brother   . Hyperlipidemia Brother   . Diabetes Paternal Grandfather     SOCIAL HISTORY:  Social History   Socioeconomic History  . Marital status: Married    Spouse name: Not on file  . Number of children: Not on file  . Years of education: Not on file  . Highest education level: Not on file  Occupational History    Comment: retired  Scientific laboratory technician  . Financial resource strain: Not on file  . Food insecurity:    Worry: Not on file    Inability: Not on file  . Transportation needs:    Medical: Not on file    Non-medical: Not on file  Tobacco Use  . Smoking status: Former Smoker    Last attempt to quit: 03/20/1975    Years since quitting: 42.5  . Smokeless tobacco: Never Used  Substance and Sexual Activity  . Alcohol use: Yes    Alcohol/week: 2.0 standard drinks    Types: 2 Cans of beer per week    Comment: daily  . Drug use: No  . Sexual activity: Yes  Lifestyle  . Physical activity:    Days per week: Not on file    Minutes per session: Not on file  . Stress: Not on file  Relationships  . Social connections:    Talks on phone: Not on file    Gets together: Not on file    Attends religious service: Not on file    Active member of club or organization: Not on file    Attends meetings of clubs or organizations: Not on file    Relationship status: Not on file  . Intimate partner violence:    Fear of current or ex partner: Not on file    Emotionally abused: Not on file    Physically abused: Not on file    Forced sexual activity: Not on file  Other Topics Concern  . Not on file  Social History Narrative  . Not on file  The patient is married and accompanied by his wife. He lives in [place].  ALLERGIES: Patient has no known allergies.  MEDICATIONS:  Current Outpatient Medications  Medication Sig Dispense Refill  . Alirocumab (PRALUENT) 150 MG/ML SOPN Inject 1 pen  into the skin every 14 (fourteen) days. 2 pen 11  . ANDROGEL PUMP 20.25 MG/ACT (1.62%) GEL Place onto the skin. 2 pump to both upper arm area twice daily (total 4 pumps daily) per Dr [name]    . apixaban (ELIQUIS) 5 MG TABS tablet Take 1 tablet (5 mg total) by mouth 2 (two) times daily. 180 tablet 2  . atorvastatin (LIPITOR) 80 MG tablet Take 80 mg by mouth daily.    [name] BAYER MICROLET LANCETS lancets CHECK  2 TIMES DAILY 200 each 2  . CONTOUR NEXT TEST test strip USE TO TEST BLOOD SUGAR DAILY 100 each 12  . cyclobenzaprine (FLEXERIL) 10 MG tablet Take 1 tablet by mouth at bed time as needed for muscle spasms. 30 tablet 0  . Dulaglutide (TRULICITY) 3.83 FX/8.3AN SOPN Inject 0.75 mg into the skin once a week. 2 mL 11  . DULoxetine (CYMBALTA) 60 MG capsule Take 3 capsules (180 mg total) by mouth daily. Per Dr Candis Schatz (Patient taking differently: Take 150 mg by mouth daily. Per Dr Candis Schatz 3 tabs one day then 2 the next day and so on.) 90 capsule 1  . empagliflozin (JARDIANCE) 10 MG TABS tablet Take 10 mg by mouth daily. 90 tablet 3  . ezetimibe (ZETIA) 10 MG tablet TAKE 1 TABLET BY MOUTH ONCE DAILY 90 tablet 1  . gabapentin (NEURONTIN) 300 MG capsule Take 500 mg by mouth 2 (two) times daily.   3  . GAVILYTE-G 236 g solution Take 1 Dose by mouth See admin instructions.  0  . glucose blood test strip One touch verio.  Test once daily. Dx E11.9 100 each 12  . Lancets (ONETOUCH ULTRASOFT) lancets One touch verio lancets.  Test once daily.  Dx e11.9 100 each 12  . lisinopril (PRINIVIL,ZESTRIL) 2.5 MG tablet Take 1 tablet (2.5 mg total) by mouth daily. 90 tablet 3  . LORazepam (ATIVAN) 0.5 MG tablet Take 1 tablet (0.5 mg total) by mouth as needed for anxiety. 30 tablet 5  . metFORMIN (GLUCOPHAGE) 500 MG tablet TAKE TWO TABLETS BY MOUTH TWICE DAILY WITH  A  MEAL 360 tablet 1  . metoprolol succinate (TOPROL-XL) 100 MG 24 hr tablet Take 1 tablet (100 mg total) by mouth daily. Take with or immediately  following a meal. 90 tablet 3  . tadalafil (CIALIS) 5 MG tablet Take 5 mg by mouth daily.     . zaleplon (SONATA) 5 MG capsule TAKE 1 CAPSULE BY MOUTH AT BEDTIME AS NEEDED FOR SLEEP 15 capsule 2  . zolpidem (AMBIEN) 10 MG tablet TAKE ONE TABLET BY MOUTH AT BEDTIME AS NEEDED FOR SLEEP 30 tablet 2   No current facility-administered medications for this encounter.     REVIEW OF SYSTEMS:  On review of systems, the patient reports that he is doing well overall. He denies any chest pain, shortness of breath, cough, fevers, chills, night sweats, unintended weight changes. He denies any bowel disturbances, and denies abdominal pain, nausea or vomiting. He denies dysuria, urinary incontinence, and leakage. He reported hematuria associated with his biopsy, which has since resolved. He denies any new musculoskeletal or joint aches or pains. He notes chronic pain associated with a bulging disc. His IPSS was 14, indicating moderate urinary symptoms. He is able to complete sexual activity with some attempts. He takes cilias for urinary symptoms as well as for ED. A complete review of systems is obtained and is otherwise negative.    PHYSICAL EXAM:  Wt Readings from Last 3 Encounters:  09/27/17 209 lb 12.8 oz (95.2 kg)  09/27/17 210 lb 12.8 oz (95.6 kg)  09/09/17 211 lb (95.7 kg)   Temp Readings from Last 3 Encounters:  09/27/17 97.8 F (36.6 C) (Oral)  07/14/17 97.8 F (36.6 C) (Oral)  07/13/17 98.3 F (36.8 C) (Oral)   BP Readings from Last 3 Encounters:  09/27/17 120/77  09/27/17 100/64  09/09/17 122/76   Pulse Readings from Last 3 Encounters:  09/27/17 79  09/27/17 72  09/09/17 (!)  108   Pain Assessment Pain Score: 0-No pain/10  In general this is a well appearing Caucasian gentleman in no acute distress. He is alert and oriented x4 and appropriate throughout the examination. HEENT reveals that the patient is normocephalic, atraumatic. EOMs are intact. Cardiopulmonary assessment is  negative for acute distress and he exhibits normal effort.     KPS = 100  100 - Normal; no complaints; no evidence of disease. 90   - Able to carry on normal activity; minor signs or symptoms of disease. 80   - Normal activity with effort; some signs or symptoms of disease. 38   - Cares for self; unable to carry on normal activity or to do active work. 60   - Requires occasional assistance, but is able to care for most of his personal needs. 50   - Requires considerable assistance and frequent medical care. 80   - Disabled; requires special care and assistance. 24   - Severely disabled; hospital admission is indicated although death not imminent. 38   - Very sick; hospital admission necessary; active supportive treatment necessary. 10   - Moribund; fatal processes progressing rapidly. 0     - Dead  Karnofsky DA, Abelmann [place], Craver LS and Burchenal [facility] [phone]) The use of the nitrogen mustards in the palliative treatment of carcinoma: with particular reference to bronchogenic carcinoma Cancer 1 634-56  LABORATORY DATA:  Lab Results  Component Value Date   WBC 5.7 09/27/2017   HGB 17.4 09/27/2017   HCT 49.8 09/27/2017   MCV 94 09/27/2017   PLT 174 09/27/2017   Lab Results  Component Value Date   NA 140 09/27/2017   K 4.4 09/27/2017   CL 98 09/27/2017   CO2 22 09/27/2017   Lab Results  Component Value Date   ALT 34 07/13/2017   AST 19 07/13/2017   ALKPHOS 56 07/13/2017   BILITOT 0.6 07/13/2017     RADIOGRAPHY: No results found.    IMPRESSION/PLAN: 1. 62 y.o. gentleman with favorable intermediate  risk, Stage T1c adenocarcinoma of the prostate with Gleason Score of 3+4, and PSA of 4.93. We discussed the patient's workup and outlines the nature of prostate cancer in this setting. The patient's T stage, Gleason's score, and PSA are considered as we offer different forms of therapy. He is a candidate for a variety of potential treatment options including brachytherapy, external  radiation prostatectomy. We discussed the available radiation techniques, and focused on the details and logistics and delivery. We discussed and outlined the risks, benefits, short and long-term effects associated with radiotherapy and compared and contrasted these with prostatectomy. We discussed the role of SpaceOAR in reducing the rectal toxicity associated with radiotherapy in both settings of radiotherapy administration. The patient expressed interest in prostate brachytherapy. We will share our discussion with Dr. Lovena Neighbours and move forward with scheduling the procedure in the near future.  Also, the patient requested a follow up PSA and this will be drawn today. We will follow up with these results when available. 2. Hypogonadism. The patient was encouraged to discuss his interest in remaining on testosterone replacement with endocrinology when he meets with their specialty soon. We did review the theoretical risks associated with his cancer in the setting of HRT.   We enjoyed meeting with him today, and will look forward to participating in the care of this very nice gentleman.  In a visit lasting 60 minutes, greater than 50% of the time was spent face to face discussing his  case, and coordinating the patient's care.     Carola Rhine, PAC    Tyler Pita, MD  [facility] Direct Dial: [phone]  Fax: [phone] [email or web address]  Skype  LinkedIn  This document serves as a record of services personally performed by Shona Simpson, PA-C and Dr. Tammi Klippel. It was created on their behalf by Wilburn Mylar, a trained medical scribe. The creation of this record is based on the scribe's personal observations and the provider's statements to them. This document has been checked and approved by the attending provider.

## 2017-09-27 NOTE — Progress Notes (Signed)
See progress note under physician encounter. 

## 2017-09-27 NOTE — Patient Instructions (Addendum)
Medication Instructions:  Your physician has recommended you make the following change in your medication:  1.  DECREASE the Lisinopril to 2.5mg  daily  Labwork: TODAY:  BMET & CBC  Testing/Procedures: None ordered  Follow-Up: Your physician wants you to follow-up in: [place] DR. Mallie Snooks will receive a reminder letter in the mail two months in advance. If you don't receive a letter, please call our office to schedule the follow-up appointment.   Any Other Special Instructions Will Be Listed Below (If Applicable). You may hold the Eliquis 3 days prior to your back injections.    If you need a refill on your cardiac medications before your next appointment, please call your pharmacy.

## 2017-09-28 ENCOUNTER — Telehealth: Payer: Self-pay | Admitting: Cardiology

## 2017-09-28 ENCOUNTER — Telehealth: Payer: Self-pay | Admitting: *Deleted

## 2017-09-28 ENCOUNTER — Telehealth: Payer: Self-pay | Admitting: Radiation Oncology

## 2017-09-28 DIAGNOSIS — C61 Malignant neoplasm of prostate: Secondary | ICD-10-CM | POA: Insufficient documentation

## 2017-09-28 DIAGNOSIS — Z79899 Other long term (current) drug therapy: Secondary | ICD-10-CM

## 2017-09-28 LAB — PROSTATE-SPECIFIC AG, SERUM (LABCORP): Prostate Specific Ag, Serum: 4.1 ng/mL — ABNORMAL HIGH (ref 0.0–4.0)

## 2017-09-28 NOTE — Telephone Encounter (Signed)
-----   Message from Freeman Caldron, [place] sent at 09/28/2017 10:42 AM EDT ----- Regarding: schedule brachytherapy with Dr. Fonnie Birkenhead, Please reach out to [facility] to begin coordinating brachytherapy/seed implant procedure and SpaceOAR gel placement with Dr. Gloriann Loan, first available after 10/19/17 (patient scheduled for screening colonoscopy 10/19/17).  He will need a CT SIM/ pubic arch study prior to his brachytherapy procedure. -Ashlyn ----- Message ----- From: Tyler Pita, MD Sent: 09/28/2017   9:19 AM EDT To: Freeman Caldron, PA-C, #  [name],  Please call patient.  PSA was 4.1 which is not any higher than it has been, which is good.  This is the patient where we would like to get the ball rolling on setting up seed implant this week before Enid Derry gets back.  I think that entails setting up a pubic arch study simulation and calling Dr. Purvis Sheffield scheduler at [facility] to figure out a date.  MM

## 2017-09-28 NOTE — Telephone Encounter (Signed)
-----   Message from Charlie Pitter, [place] sent at 09/27/2017  4:22 PM EDT ----- Please let patient know labs stable except 2 abnormalities: - glucose mildly elevated - has diabetes - calcium elevated, unclear if lab error or true abnormality. If hes started any new calcium supplements recently would stop. Otherwise recommend to repeat calcium level in a few days.  Dayna Dunn PA-C

## 2017-09-28 NOTE — Telephone Encounter (Signed)
New Message:        [facility] Pre-operative Risk Assessment    Request for surgical clearance:  What type of surgery is being performed? Prostate Implant   1. When is this surgery scheduled? TBD  2. What type of clearance is required (medical clearance vs. Pharmacy clearance to hold med vs. Both)? Both  3. Are there any medications that need to be held prior to surgery and how long? TBD 1 Week  4. Practice name and name of physician performing surgery? [facility]   5. What is your office phone number [phone]   7.   What is your office fax number [phone]  8.   Anesthesia type (None, local, MAC, general) ? General    Brandon Frank 09/28/2017, 10:42 AM  _________________________________________________________________   (provider comments below)

## 2017-09-28 NOTE — Progress Notes (Signed)
Phoned patient. Informed him that his PSA drawn yesterday came back as 4.1 which is not any higher than it has been, which is good. Explained that the pubic arch study simulation will occur before the colonoscopy on 10/19/2017 and that sim staff should be in contact with him with an appointment by the first of the coming week. In addition, explained that the colonoscopy will occur before the seeds are implanted. Provided patient with my direct contact number in the event he has questions, concerns or needs. Patient verbalized understanding of all discuss and agreed with the current plan.

## 2017-09-29 ENCOUNTER — Encounter: Payer: Self-pay | Admitting: General Practice

## 2017-09-29 NOTE — Progress Notes (Signed)
[place] Psychosocial Distress Screening Clinical Social Work  Clinical Social Work was referred by distress screening protocol.  The patient scored a 7 on the Psychosocial Distress Thermometer which indicates moderate distress. Clinical Social Worker contacted patient by phone to assess for distress and other psychosocial needs.  Had wait time of several months from initial concern about cancer to yesterday's appointment at [facility] - now feels that his needs are being addressed in efficient manner.  Confident in care available to him at [facility].  Was in MD office so had limited time, CSW provided brief overview of [place] and will mail resource packet.  Encouraged to call as needed for resources and support.   ONCBCN DISTRESS SCREENING 09/27/2017  Screening Type Initial Screening  Distress experienced in past week (1-10) 7  Family Problem type Partner  Emotional problem type Nervousness/Anxiety;Feeling hopeless  Information Concerns Type Lack of info about diagnosis;Lack of info about treatment  Physical Problem type Pain;Sleep/insomnia;Constipation/diarrhea;Changes in urination;Sexual problems  Physician notified of physical symptoms Yes  Referral to clinical psychology No  Referral to clinical social work No  Referral to dietition No  Referral to financial advocate No  Referral to support programs No  Referral to palliative care No    Clinical Social Worker follow up needed: No.  If yes, follow up plan:  Beverely Pace, [place], LCSW Clinical Social Worker Phone:  [phone]

## 2017-09-29 NOTE — Telephone Encounter (Signed)
   Regarding medical clearance, I feel Mr. Potenza is cleared to proceed with prostate biopsy as requested without further cardiovascular testing. He is able to bike and hike without any anginal symptoms, and he was in NSR at most recent visit. I would recommend continuing beta blocker and telemetry perioperatively.  I will route to pharm for input on Eliquis. At the recent OV, we talked about back injection Tana Coast had recommended 3 days) but not sure what category the biopsy would fall into with bleeding risk and timing. 1 week sounds excessive.  Once the anticoag is clarified, please send the bundled recommendation to requesting surgeon. Thanks!  Charlie Pitter, PA-C 09/29/2017, 2:41 PM

## 2017-09-29 NOTE — Telephone Encounter (Addendum)
Pt takes Eliquis for afib with CHADS2VASc score of 3 (DM, HTN, CAD). Renal function is normal. Would recommend holding Eliquis 2 days prior to prostate biopsy. Agree 1 week would be excessive.  He will still need to hold 3 days prior to spinal injection due to higher risk surgical area. Looks as though pt has an upcoming colonoscopy as well on 10/1, would recommend holding Eliquis 1-2 days prior to that procedure.

## 2017-09-29 NOTE — Telephone Encounter (Signed)
   Primary Cardiologist: Fransico Him, MD  Chart reviewed as part of pre-operative protocol coverage. Given past medical history and time since last visit, based on ACC/AHA guidelines, LENELL LAMA would be at acceptable risk for the planned procedure without further cardiovascular testing.   Ok to hold Eliquis 48 hours pre op.  I will route this recommendation to the requesting party via Epic fax function and remove from pre-op pool.  Please call with questions.  Kerin Ransom, PA-C 09/29/2017, 3:16 PM

## 2017-09-29 NOTE — Telephone Encounter (Signed)
Dayna you just saw him two days ago- can you comment on cardiac clearance and possibly holding Eliquis for "1 week" as requested.   Kerin Ransom PA-C 09/29/2017 2:29 PM

## 2017-09-30 ENCOUNTER — Other Ambulatory Visit: Payer: BLUE CROSS/BLUE SHIELD | Admitting: *Deleted

## 2017-09-30 ENCOUNTER — Telehealth: Payer: Self-pay | Admitting: Radiation Oncology

## 2017-09-30 DIAGNOSIS — Z79899 Other long term (current) drug therapy: Secondary | ICD-10-CM

## 2017-09-30 NOTE — Telephone Encounter (Signed)
LVM msg wanting patient to confirm his 10/4 preseed appt and to let him know that I've left a msg with Marlowe Kays at [facility] to schedule an implant/spaceOar for him.

## 2017-10-01 ENCOUNTER — Other Ambulatory Visit: Payer: Self-pay | Admitting: Urology

## 2017-10-01 LAB — CALCIUM: CALCIUM: 9.6 mg/dL (ref 8.6–10.2)

## 2017-10-01 NOTE — Telephone Encounter (Signed)
Did not need this encounter °

## 2017-10-04 NOTE — Progress Notes (Signed)
Pt has been made aware of normal result and verbalized understanding.  jw 10/04/17

## 2017-10-06 ENCOUNTER — Other Ambulatory Visit: Payer: Self-pay | Admitting: Family Medicine

## 2017-10-13 ENCOUNTER — Telehealth: Payer: Self-pay | Admitting: *Deleted

## 2017-10-13 NOTE — Telephone Encounter (Signed)
I actually checked the DPR on file before I would even send the message to you. Thanks!  I will pass the message.

## 2017-10-13 NOTE — Telephone Encounter (Signed)
Please make sure patient is agreeable to relaying this info to [name]. See prior echo report - I did comment on this so it was relayed to him - "Otherwise echo looks good - normal squeeze function.There is some thickening at the base of his heart but I reviewed with Dr. Radford Pax and this is of no concern at this time." This is localized to only one area of the heart. In the absence of syncope or worsening dyspnea, nothing to do about this except make sure BP and weight are well controlled. He is already on a beta blocker medicine which would help relax the heart with this finding. Dayna Dunn PA-C

## 2017-10-13 NOTE — Telephone Encounter (Signed)
Brandon Frank is working with me today and she wanted to know, on pts AVS at his last office visit, it has LVH and she said noone has ever mentioned to them about this dx. She wants to know if you can elaborate?  On the recent echo it does state severe LVH. Thanks!

## 2017-10-14 ENCOUNTER — Telehealth: Payer: Self-pay | Admitting: Cardiology

## 2017-10-14 ENCOUNTER — Other Ambulatory Visit: Payer: Self-pay | Admitting: Family Medicine

## 2017-10-14 NOTE — Telephone Encounter (Signed)
New message      [facility] Pre-operative Risk Assessment    Request for surgical clearance:  1. What type of surgery is being performed? Radioactive Seed Implant   2. When is this surgery scheduled? 12/08/2017  3. What type of clearance is required (medical clearance vs. Pharmacy clearance to hold med vs. Both)? Both   4. Are there any medications that need to be held prior to surgery and how long?Leaving the decision up to the Physician  5. Practice name and name of physician performing surgery?[facility], Dr. Link Snuffer  6. What is your office phone number [phone]    7.   What is your office fax number [phone]  8.   Anesthesia type (None, local, MAC, general) ? General    Brandon Frank 10/14/2017, 11:28 AM  _________________________________________________________________   (provider comments below)

## 2017-10-14 NOTE — Telephone Encounter (Signed)
Pharm, please addres Eliquis      Primary Cardiologist: Fransico Him, MD  Chart reviewed as part of pre-operative protocol coverage. Given past medical history and time since last visit, based on ACC/AHA guidelines, JATAVIS MALEK would be at acceptable risk for the planned procedure without further cardiovascular testing.   Eliquis to be addressed  I will route this recommendation to the requesting party via Epic fax function and remove from pre-op pool.  Please call with questions.  Cecilie Kicks, NP 10/14/2017, 4:08 PM

## 2017-10-15 NOTE — Telephone Encounter (Signed)
Pt takes Eliquis for afib with CHADS2VASc score of 3 (DM, HTN, CAD). Renal function is normal. Ok to hold Eliquis for 2-3 days prior to seed implant.

## 2017-10-18 ENCOUNTER — Other Ambulatory Visit: Payer: Self-pay

## 2017-10-18 ENCOUNTER — Other Ambulatory Visit: Payer: Self-pay | Admitting: Family Medicine

## 2017-10-18 ENCOUNTER — Encounter (HOSPITAL_COMMUNITY): Payer: Self-pay | Admitting: *Deleted

## 2017-10-18 ENCOUNTER — Ambulatory Visit (INDEPENDENT_AMBULATORY_CARE_PROVIDER_SITE_OTHER): Payer: BLUE CROSS/BLUE SHIELD | Admitting: *Deleted

## 2017-10-18 DIAGNOSIS — Z23 Encounter for immunization: Secondary | ICD-10-CM

## 2017-10-18 NOTE — Anesthesia Preprocedure Evaluation (Addendum)
Anesthesia Evaluation  Patient identified by MRN, date of birth, ID band Patient awake    Reviewed: Allergy & Precautions, NPO status , Patient's Chart, lab work & pertinent test results, reviewed documented beta blocker date and time   History of Anesthesia Complications Negative for: history of anesthetic complications  Airway Mallampati: I  TM Distance: >3 FB Neck ROM: Full    Dental  (+) Dental Advisory Given   Pulmonary sleep apnea and Continuous Positive Airway Pressure Ventilation , former smoker (quit 1977),    breath sounds clear to auscultation       Cardiovascular hypertension, Pt. on medications and Pt. on home beta blockers (-) angina+ dysrhythmias Atrial Fibrillation and Supra Ventricular Tachycardia  Rhythm:Regular Rate:Normal  7/19 ECHO: EF 60-65%, valves ok   Neuro/Psych Anxiety negative neurological ROS     GI/Hepatic Neg liver ROS, neg GERD  ,  Endo/Other  diabetes (glu 114), Insulin Dependent, Oral Hypoglycemic AgentsMorbid obesity  Renal/GU negative Renal ROS     Musculoskeletal   Abdominal   Peds  Hematology eliquis   Anesthesia Other Findings   Reproductive/Obstetrics                            Anesthesia Physical Anesthesia Plan  ASA: III  Anesthesia Plan: MAC   Post-op Pain Management:    Induction:   PONV Risk Score and Plan: 1 and Treatment may vary due to age or medical condition  Airway Management Planned: Natural Airway and Nasal Cannula  Additional Equipment:   Intra-op Plan:   Post-operative Plan:   Informed Consent: I have reviewed the patients History and Physical, chart, labs and discussed the procedure including the risks, benefits and alternatives for the proposed anesthesia with the patient or authorized representative who has indicated his/her understanding and acceptance.   Dental advisory given  Plan Discussed with: CRNA and  Surgeon  Anesthesia Plan Comments: (Plan routine monitors, MAC)        Anesthesia Quick Evaluation

## 2017-10-18 NOTE — Progress Notes (Signed)
Per orders of Dr. Burchette, injection of Shingrix given by Anastasios Melander. Patient tolerated injection well. 

## 2017-10-19 ENCOUNTER — Ambulatory Visit (HOSPITAL_COMMUNITY): Payer: BLUE CROSS/BLUE SHIELD | Admitting: Anesthesiology

## 2017-10-19 ENCOUNTER — Other Ambulatory Visit: Payer: Self-pay

## 2017-10-19 ENCOUNTER — Encounter (HOSPITAL_COMMUNITY)
Admission: RE | Disposition: A | Discharge status: Home / Self Care | Payer: Self-pay | Source: Ambulatory Visit | Attending: Gastroenterology

## 2017-10-19 ENCOUNTER — Ambulatory Visit (HOSPITAL_COMMUNITY)
Admission: RE | Admit: 2017-10-19 | Discharge: 2017-10-19 | Disposition: A | Discharge status: Home / Self Care | Payer: BLUE CROSS/BLUE SHIELD | Source: Ambulatory Visit | Attending: Gastroenterology | Admitting: Gastroenterology

## 2017-10-19 ENCOUNTER — Encounter (HOSPITAL_COMMUNITY): Payer: Self-pay | Admitting: Anesthesiology

## 2017-10-19 DIAGNOSIS — F419 Anxiety disorder, unspecified: Secondary | ICD-10-CM | POA: Insufficient documentation

## 2017-10-19 DIAGNOSIS — E663 Overweight: Secondary | ICD-10-CM | POA: Insufficient documentation

## 2017-10-19 DIAGNOSIS — Z87891 Personal history of nicotine dependence: Secondary | ICD-10-CM | POA: Diagnosis not present

## 2017-10-19 DIAGNOSIS — Z1211 Encounter for screening for malignant neoplasm of colon: Secondary | ICD-10-CM | POA: Insufficient documentation

## 2017-10-19 DIAGNOSIS — Z7984 Long term (current) use of oral hypoglycemic drugs: Secondary | ICD-10-CM | POA: Insufficient documentation

## 2017-10-19 DIAGNOSIS — Z79890 Hormone replacement therapy (postmenopausal): Secondary | ICD-10-CM | POA: Diagnosis not present

## 2017-10-19 DIAGNOSIS — K5730 Diverticulosis of large intestine without perforation or abscess without bleeding: Secondary | ICD-10-CM

## 2017-10-19 DIAGNOSIS — E119 Type 2 diabetes mellitus without complications: Secondary | ICD-10-CM | POA: Diagnosis not present

## 2017-10-19 DIAGNOSIS — Z79899 Other long term (current) drug therapy: Secondary | ICD-10-CM | POA: Insufficient documentation

## 2017-10-19 DIAGNOSIS — G4733 Obstructive sleep apnea (adult) (pediatric): Secondary | ICD-10-CM | POA: Insufficient documentation

## 2017-10-19 DIAGNOSIS — K635 Polyp of colon: Secondary | ICD-10-CM | POA: Diagnosis not present

## 2017-10-19 DIAGNOSIS — Z6829 Body mass index (BMI) 29.0-29.9, adult: Secondary | ICD-10-CM | POA: Diagnosis not present

## 2017-10-19 DIAGNOSIS — I480 Paroxysmal atrial fibrillation: Secondary | ICD-10-CM | POA: Insufficient documentation

## 2017-10-19 DIAGNOSIS — Z7901 Long term (current) use of anticoagulants: Secondary | ICD-10-CM | POA: Insufficient documentation

## 2017-10-19 DIAGNOSIS — Z8546 Personal history of malignant neoplasm of prostate: Secondary | ICD-10-CM | POA: Diagnosis not present

## 2017-10-19 DIAGNOSIS — I4892 Unspecified atrial flutter: Secondary | ICD-10-CM | POA: Diagnosis not present

## 2017-10-19 DIAGNOSIS — E785 Hyperlipidemia, unspecified: Secondary | ICD-10-CM | POA: Diagnosis not present

## 2017-10-19 DIAGNOSIS — G4700 Insomnia, unspecified: Secondary | ICD-10-CM | POA: Diagnosis not present

## 2017-10-19 DIAGNOSIS — I10 Essential (primary) hypertension: Secondary | ICD-10-CM | POA: Diagnosis not present

## 2017-10-19 HISTORY — DX: Diverticulosis of large intestine without perforation or abscess without bleeding: K57.30

## 2017-10-19 HISTORY — PX: POLYPECTOMY: SHX5525

## 2017-10-19 HISTORY — PX: COLONOSCOPY WITH PROPOFOL: SHX5780

## 2017-10-19 HISTORY — PX: BIOPSY: SHX5522

## 2017-10-19 LAB — GLUCOSE, CAPILLARY: Glucose-Capillary: 114 mg/dL — ABNORMAL HIGH (ref 70–99)

## 2017-10-19 SURGERY — COLONOSCOPY WITH PROPOFOL
Anesthesia: Monitor Anesthesia Care

## 2017-10-19 MED ORDER — LIDOCAINE 2% (20 MG/ML) 5 ML SYRINGE
INTRAMUSCULAR | Status: DC | PRN
  Administered 2017-10-19: 100 mg via INTRAVENOUS

## 2017-10-19 MED ORDER — SODIUM CHLORIDE 0.9 % IV SOLN: INTRAVENOUS | Status: DC

## 2017-10-19 MED ORDER — PROPOFOL 10 MG/ML IV BOLUS
INTRAVENOUS | Status: DC | PRN
  Administered 2017-10-19 (×6): 20 mg via INTRAVENOUS

## 2017-10-19 MED ORDER — PROPOFOL 500 MG/50ML IV EMUL
INTRAVENOUS | Status: DC | PRN
  Administered 2017-10-19: 140 ug/kg/min via INTRAVENOUS

## 2017-10-19 MED ORDER — LACTATED RINGERS IV SOLN
INTRAVENOUS | Status: DC
  Administered 2017-10-19: 1000 mL via INTRAVENOUS

## 2017-10-19 MED ORDER — PROPOFOL 10 MG/ML IV BOLUS
INTRAVENOUS | Status: AC
  Filled 2017-10-19: qty 60

## 2017-10-19 SURGICAL SUPPLY — 22 items

## 2017-10-19 NOTE — H&P (Signed)
Brandon Frank is an 62 y.o. male.   Chief Complaint: Colorectal cancer screening. HPI: 62 year old white male here for colorectal cancer screening. See office notes for details.  Past Medical History:  Diagnosis Date  . Anxiety 02/03/2011  . Atrial fibrillation and flutter ([place]) 07/13/2017  . Chronic low back pain   . Family history of premature CAD 03/20/2015  . Idiopathic peripheral neuropathy 12/06/2006   Qualifier: Diagnosis of  By: [name], [name]    . Hyperlipidemia   . HTN 04/01/2016  . Infection of urinary tract 06/22/2015  . Insomnia 12/06/2006   Qualifier: Diagnosis of  By: [name], [name]    . Low testosterone 10/07/2015  . Mobitz type 1 second degree atrioventricular block    a. Event monitor 2017 - 1 beat.  . Obstructive sleep apnea 12/06/2013   NPSG 11/14/13- Severe OSA, AHI 86/ hr with loud snore, desat ot 80%, titrated to CPAP 12, weight 215 lbs   . Overweight 10/23/2008   Qualifier: Diagnosis of  By: [name], MD, [name] [name]    . Paroxysmal atrial flutter ([name])   . Plantar fasciitis of right foot 10/21/2012  . Premature atrial contractions   . Prostate cancer ([place])   . PVC's (premature ventricular contractions)   . Sinus tachycardia 10/27/2013   Persistent mild sinus tachycardia, October, 2015   //   48 hour Holter, October, 2015, rare PACs and PVCs. Heart rate range 70-128, mean heart rate 95 during the daytime, mean heart rate 85 during the resting hours.   //   Patient was diagnosed with severe sleep apnea. C Pap was started and his resting sinus tachycardia improved greatly.   . TRANSAMINASES, SERUM, ELEVATED 11/30/2005   Qualifier: Diagnosis of  By: [name], [name]    . Type 2 diabetes mellitus, uncontrolled ([place]) 04/14/2012  . URI (upper respiratory infection) 06/22/2015   Past Surgical History:  Procedure Laterality Date  . HERNIA REPAIR    . PROSTATE BIOPSY N/A   . rotator cuff surgery  2012   Dr. [name]   Family History  Problem Relation Age of Onset   . Heart disease Father 19       MI  . Hyperlipidemia Father   . Hypertension Father   . Diabetes Brother   . Hypertension Brother   . Hyperlipidemia Brother   . Diabetes Paternal Grandfather    Social History:  reports that he quit smoking about 42 years ago. He has never used smokeless tobacco. He reports that he drinks about 2.0 standard drinks of alcohol per week. He reports that he does not use drugs.  Allergies: No Known Allergies  Medications Prior to Admission  Medication Sig Dispense Refill  . acetaminophen (TYLENOL) 500 MG tablet Take 1,000 mg by mouth 2 (two) times daily as needed for moderate pain or headache.    . Alirocumab (PRALUENT) 150 MG/ML SOPN Inject 1 pen into the skin every 14 (fourteen) days. 2 pen 11  . ANDROGEL PUMP 20.25 MG/ACT (1.62%) GEL Place 4 Pump onto the skin daily. 2 pump to both upper arm area once daily (total 4 pumps daily) per Dr [name]    . apixaban (ELIQUIS) 5 MG TABS tablet Take 1 tablet (5 mg total) by mouth 2 (two) times daily. 180 tablet 2  . atorvastatin (LIPITOR) 80 MG tablet Take 80 mg by mouth daily.    [name] BAYER MICROLET LANCETS lancets USE   TO CHECK GLUCOSE TWICE DAILY 100 each 5  .  calcium carbonate (TUMS - DOSED IN MG ELEMENTAL CALCIUM) 500 MG chewable tablet Chew 1-2 tablets by mouth daily as needed for indigestion or heartburn.    . CONTOUR NEXT TEST test strip USE 1 STRIP TO CHECK GLUCOSE ONCE DAILY 50 each 25  . cyclobenzaprine (FLEXERIL) 10 MG tablet Take 1 tablet by mouth at bed time as needed for muscle spasms. 30 tablet 0  . Dulaglutide (TRULICITY) 5.42 HC/6.2BJ SOPN Inject 0.75 mg into the skin once a week. 2 mL 11  . DULoxetine (CYMBALTA) 60 MG capsule Take 3 capsules (180 mg total) by mouth daily. Per Dr Candis Schatz (Patient taking differently: Take 120-180 mg by mouth See admin instructions. Alternate taking 180 and 120 mg once daily Per Dr Candis Schatz 3 tabs one day then 2 the next day and so on.) 90 capsule 1  . ezetimibe  (ZETIA) 10 MG tablet TAKE 1 TABLET BY MOUTH ONCE DAILY 90 tablet 1  . fluticasone (FLONASE) 50 MCG/ACT nasal spray Place 1 spray into both nostrils daily as needed for allergies or rhinitis.    [name] gabapentin (NEURONTIN) 600 MG tablet Take 600 mg by mouth 2 (two) times daily.    [name] GAVILYTE-G 236 g solution Take 1 Dose by mouth See admin instructions.  0  . glucose blood test strip One touch verio.  Test once daily. Dx E11.9 100 each 12  . JARDIANCE 10 MG TABS tablet TAKE 1 TABLET BY MOUTH ONCE DAILY 90 tablet 3  . Lancets (ONETOUCH ULTRASOFT) lancets One touch verio lancets.  Test once daily.  Dx e11.9 100 each 12  . lisinopril (PRINIVIL,ZESTRIL) 2.5 MG tablet Take 1 tablet (2.5 mg total) by mouth daily. 90 tablet 3  . LORazepam (ATIVAN) 1 MG tablet Take 1 mg by mouth 4 (four) times daily.    . metFORMIN (GLUCOPHAGE) 500 MG tablet TAKE TWO TABLETS BY MOUTH TWICE DAILY WITH  A  MEAL 360 tablet 1  . metoprolol succinate (TOPROL-XL) 100 MG 24 hr tablet Take 1 tablet (100 mg total) by mouth daily. Take with or immediately following a meal. 90 tablet 3  . Polyethyl Glycol-Propyl Glycol (SYSTANE OP) Place 1 drop into both eyes daily as needed (dryness).    . psyllium (METAMUCIL) 58.6 % powder Take 1 packet by mouth daily.    . tadalafil (CIALIS) 5 MG tablet Take 5 mg by mouth daily.     . Tetrahydrozoline HCl (VISINE OP) Place 1 drop into both eyes daily as needed (redness/irritation).    . zaleplon (SONATA) 5 MG capsule TAKE 1 CAPSULE BY MOUTH AT BEDTIME AS NEEDED FOR SLEEP 15 capsule 2  . zolpidem (AMBIEN) 10 MG tablet TAKE ONE TABLET BY MOUTH AT BEDTIME AS NEEDED FOR SLEEP (Patient taking differently: Take 10 mg by mouth at bedtime. ) 30 tablet 2    Results for orders placed or performed during the hospital encounter of 10/19/17 (from the past 48 hour(s))  Glucose, capillary     Status: Abnormal   Collection Time: 10/19/17  6:55 AM  Result Value Ref Range   Glucose-Capillary 114 (H) 70 - 99 mg/dL    No results found.  Review of Systems  Constitutional: Negative.   HENT: Negative.   Eyes: Negative.   Respiratory: Negative.   Cardiovascular: Negative.   Gastrointestinal: Negative.   Musculoskeletal: Positive for joint pain.  Skin: Negative.   Neurological: Negative.   Endo/Heme/Allergies: Negative.   Psychiatric/Behavioral: Positive for depression. The patient is nervous/anxious and has insomnia.  Blood pressure 132/78, pulse 91, temperature 97.9 F (36.6 C), temperature source Oral, resp. rate 18, height 5\' 10"  (1.778 m), weight 92.5 kg, SpO2 96 %. Physical Exam  Constitutional: He is oriented to person, place, and time. He appears well-developed and well-nourished.  HENT:  Head: Normocephalic and atraumatic.  Eyes: Pupils are equal, round, and reactive to light. Conjunctivae and EOM are normal.  Neck: Normal range of motion. Neck supple.  Cardiovascular: Normal rate and regular rhythm.  Respiratory: Effort normal and breath sounds normal.  GI: Soft. Bowel sounds are normal.  Musculoskeletal: Normal range of motion.  Neurological: He is alert and oriented to person, place, and time.  Skin: Skin is warm and dry.  Psychiatric: He has a normal mood and affect. His behavior is normal. Judgment and thought content normal.   Assessment/Plan Colorectal cancer screening: proceed with a colonoscopy at this time.  Chevon Laufer, MD 10/19/2017, 7:21 AM

## 2017-10-19 NOTE — Transfer of Care (Signed)
Immediate Anesthesia Transfer of Care Note  Patient: Brandon Frank  Procedure(s) Performed: COLONOSCOPY WITH PROPOFOL (N/A ) BIOPSY POLYPECTOMY  Patient Location: Endoscopy Unit  Anesthesia Type:MAC  Level of Consciousness: awake and alert   Airway & Oxygen Therapy: Patient Spontanous Breathing  Post-op Assessment: Report given to RN and Post -op Vital signs reviewed and stable  Post vital signs: Reviewed and stable  Last Vitals:  Vitals Value Taken Time  BP    Temp    Pulse    Resp    SpO2      Last Pain:  Vitals:   10/19/17 0638  TempSrc: Oral  PainSc:          Complications: No apparent anesthesia complications

## 2017-10-19 NOTE — Anesthesia Procedure Notes (Signed)
Date/Time: 10/19/2017 7:28 AM Performed by: Sharlette Dense, CRNA Oxygen Delivery Method: Simple face mask

## 2017-10-19 NOTE — Anesthesia Postprocedure Evaluation (Signed)
Anesthesia Post Note  Patient: Brandon Frank  Procedure(s) Performed: COLONOSCOPY WITH PROPOFOL (N/A ) BIOPSY POLYPECTOMY     Patient location during evaluation: Endoscopy Anesthesia Type: MAC Level of consciousness: awake and alert, oriented and patient cooperative Pain management: pain level controlled Vital Signs Assessment: post-procedure vital signs reviewed and stable Respiratory status: spontaneous breathing, nonlabored ventilation and respiratory function stable Cardiovascular status: blood pressure returned to baseline and stable Postop Assessment: no apparent nausea or vomiting Anesthetic complications: no    Last Vitals:  Vitals:   10/19/17 0800 10/19/17 0810  BP: 133/78 130/90  Pulse: 83 76  Resp: 13 16  Temp:    SpO2: 97% 98%    Last Pain:  Vitals:   10/19/17 0810  TempSrc:   PainSc: 0-No pain                 Ciera Beckum,E. Dijuan Sleeth

## 2017-10-19 NOTE — Discharge Instructions (Signed)

## 2017-10-19 NOTE — Op Note (Signed)
[facility] Patient Name: Brandon Frank Procedure Date: 10/19/2017 MRN: [id number] Attending MD: Juanita Craver , MD Date of Birth: 06-04-1955 CSN: [id number] Age: 61 Admit Type: Outpatient Procedure:                Colonoscopy with cold biopsies & hot snare                            polypectomy x 1. Indications:              CRC screening for colorectal malignant neoplasm. Providers:                Juanita Craver, MD, Vista Lawman, RN, Charolette Child,                            Technician, Danley Danker, CRNA Referring MD:             [name], MD Medicines:                Monitored Anesthesia Care Complications:            No immediate complications. Estimated Blood Loss:     Estimated blood loss was minimal. Procedure:                Pre-anesthesia assessment: - Prior to the                            procedure, a history and physical was performed,                            and patient medications and allergies were                            reviewed. The patient's tolerance of previous                            anesthesia was also reviewed. The risks and                            benefits of the procedure and the sedation options                            and risks were discussed with the patient. All                            questions were answered, and informed consent was                            obtained. Prior Anticoagulants: The patient has                            taken Eliquis (apixaban), last dose was 6 days                            prior to procedure. ASA Grade assessment: III - A  patient with severe systemic disease. After                            reviewing the risks and benefits, the patient was                            deemed in satisfactory condition to undergo the                            procedure. After obtaining informed consent, the                            colonoscope was passed under direct  vision.                            Throughout the procedure, the patient's blood                            pressure, pulse, and oxygen saturations were                            monitored continuously. The CF-HQ190L ([id number])                            Olympus adult colonoscope was introduced through                            the anus and advanced to the the terminal ileum,                            with identification of the appendiceal orifice and                            IC valve. The colonoscopy was performed without                            difficulty. The patient tolerated the procedure                            well. The quality of the bowel preparation was                            adequate. The terminal ileum, the ileocecal valve,                            the appendiceal orifice and the rectum were                            photographed. The bowel preparation used was                            GoLYTELY. Scope In: 7:32:48 AM Scope Out: 7:51:40 AM Scope Withdrawal Time: 0 hours 13 minutes 16 seconds  Total Procedure Duration: 0 hours 18 minutes 52  seconds  Findings:      Four small sessile polyps were found in the distal sigmoid colon-these       were removed by cold biopsies.      Two small sessile polyps were found in the mid- transverse colon-these       were removed by colsd biopsies.      A 8 mm sessile polyp was found in the mid-ascending colon; the polyp was       removed with a hot snare x 1-200/20; resection and retrieval were       complete.      Multiple small and large-mouthed diverticula were found in the sigmoid       colon.      The terminal ileum appeared normal.      No additional abnormalities were found on retroflexion. Impression:               - Four small sessile polyps in the distal sigmoid                            colon-removed by cold biopsies.                           - Two small sessile polyps in the mid-transverse                             colon-removed by cold biopsies.                           - One 8 mm sessile polyp in the mid ascending                            colon, removed with a hot snare x 1; resected and                            retrieved.                           - Diverticulosis in the sigmoid colon.                           - The examined portion of the ileum was normal.                           -Normal exam on retroflexion. Moderate Sedation:      MAC used. Recommendation:           - High fiber diet with augmented water consumption                            daily.                           - Continue present medications.                           - Await pathology results.                           -  Continue present medications.                           - No Eliquis, Aspirin, Ibuprofen, Naproxen, or                            other non-steroidal anti-inflammatory drugs for 7                            weeks after polyp removal.                           - Return to my office PRN.                           - If the patient has any abnormal GI symptoms in                            the interim, he has been advised to call the office                            ASAP for further recommendations.                           - Repeat colonoscopy in 5 years for surveillance. Procedure Code(s):        --- Professional ---                           [phone], Colonoscopy, flexible; with removal of                            tumor(s), polyp(s), or other lesion(s) by snare                            technique                           45380, 83, Colonoscopy, flexible; with biopsy,                            single or multiple Diagnosis Code(s):        --- Professional ---                           Z12.11, Encounter for screening for malignant                            neoplasm of colon                           D12.2, Benign neoplasm of ascending colon                           D12.3, Benign neoplasm of  transverse colon (hepatic  flexure or splenic flexure)                           D12.5, Benign neoplasm of sigmoid colon                           K57.30, Diverticulosis of large intestine without                            perforation or abscess without bleeding CPT copyright 2017 American [employer] Association. All rights reserved. The codes documented in this report are preliminary and upon coder review may  be revised to meet current compliance requirements. Juanita Craver, MD Juanita Craver, MD 10/19/2017 8:10:37 AM This report has been signed electronically. Number of Addenda: 0

## 2017-10-21 ENCOUNTER — Telehealth: Payer: Self-pay | Admitting: *Deleted

## 2017-10-21 ENCOUNTER — Ambulatory Visit (HOSPITAL_COMMUNITY): Payer: BLUE CROSS/BLUE SHIELD | Admitting: Nurse Practitioner

## 2017-10-21 ENCOUNTER — Encounter (HOSPITAL_COMMUNITY): Payer: Self-pay | Admitting: Gastroenterology

## 2017-10-21 NOTE — Telephone Encounter (Signed)
Called patient to remind of pre-seed appt. for 10-22-17, lvm for a return call

## 2017-10-22 ENCOUNTER — Encounter: Payer: Self-pay | Admitting: Medical Oncology

## 2017-10-22 ENCOUNTER — Ambulatory Visit
Admission: RE | Admit: 2017-10-22 | Discharge: 2017-10-22 | Disposition: A | Discharge status: Home / Self Care | Payer: BLUE CROSS/BLUE SHIELD | Source: Ambulatory Visit | Attending: Radiation Oncology | Admitting: Radiation Oncology

## 2017-10-22 DIAGNOSIS — C61 Malignant neoplasm of prostate: Secondary | ICD-10-CM | POA: Insufficient documentation

## 2017-10-22 NOTE — Progress Notes (Signed)
  Radiation Oncology         [phone] ________________________________  Name: RAED SCHALK MRN: [id number]  Date: 10/22/2017  DOB: Dec 14, 1955  SIMULATION AND TREATMENT PLANNING NOTE PUBIC ARCH STUDY  UD:THYHOOILN, [name], MD  [name], MD  DIAGNOSIS: 62 y.o. gentleman with T1c adenocarcinoma of the prostate with a Gleason score of 3+4 and a PSA of 4.93     ICD-10-CM   1. Malignant neoplasm of prostate ([place]) C61     COMPLEX SIMULATION:  The patient presented today for evaluation for possible prostate seed implant. He was brought to the radiation planning suite and placed supine on the CT couch. A 3-dimensional image study set was obtained in upload to the planning computer. There, on each axial slice, I contoured the prostate gland. Then, using three-dimensional radiation planning tools I reconstructed the prostate in view of the structures from the transperineal needle pathway to assess for possible pubic arch interference. In doing so, I did not appreciate any pubic arch interference. Also, the patient's prostate volume was estimated based on the drawn structure. The volume was 40 cc.   TRUS volume at biopsy was 40 cc. Given the pubic arch appearance and prostate volume, patient remains a good candidate to proceed with prostate seed implant. Today, he freely provided informed written consent to proceed.    PLAN: The patient will undergo prostate seed implant.   ________________________________  Sheral Apley. Tammi Klippel, M.D.

## 2017-10-25 ENCOUNTER — Ambulatory Visit: Payer: BLUE CROSS/BLUE SHIELD | Admitting: Internal Medicine

## 2017-10-26 ENCOUNTER — Other Ambulatory Visit: Payer: Self-pay

## 2017-10-26 ENCOUNTER — Ambulatory Visit: Payer: BLUE CROSS/BLUE SHIELD | Attending: Physician Assistant | Admitting: Physical Therapy

## 2017-10-26 ENCOUNTER — Encounter: Payer: Self-pay | Admitting: Physical Therapy

## 2017-10-26 DIAGNOSIS — M6281 Muscle weakness (generalized): Secondary | ICD-10-CM | POA: Insufficient documentation

## 2017-10-26 DIAGNOSIS — M5442 Lumbago with sciatica, left side: Secondary | ICD-10-CM | POA: Insufficient documentation

## 2017-10-26 DIAGNOSIS — M5441 Lumbago with sciatica, right side: Secondary | ICD-10-CM | POA: Diagnosis present

## 2017-10-26 NOTE — Therapy (Signed)
[facility] [address], [place], [place], [address] Phone: [phone]   Fax:  [phone]  Physical Therapy Evaluation  Patient Details  Name: Brandon Frank MRN: [id number] Date of Birth: [birth year] Referring Provider (PT): Ronette Deter, [place]   Encounter Date: 10/26/2017  PT End of Session - 10/26/17 1551    Visit Number  1    Date for PT Re-Evaluation  12/07/17    Authorization Type  BCBS    Authorization - Visit Number  1    Authorization - Number of Visits  30    PT Start Time  1400    PT Stop Time  2671    PT Time Calculation (min)  45 min    Activity Tolerance  Patient tolerated treatment well    Behavior During Therapy  [facility] for tasks assessed/performed       Past Medical History:  Diagnosis Date  . Anxiety 02/03/2011  . Atrial fibrillation and flutter ([place]) 07/13/2017  . Chronic low back pain   . Family history of premature CAD 03/20/2015  . Hereditary and idiopathic peripheral neuropathy 12/06/2006   Qualifier: Diagnosis of  By: [name], [name]    . Hyperlipidemia   . Hypertension associated with diabetes ([place]) 04/01/2016  . Infection of urinary tract 06/22/2015  . Insomnia 12/06/2006   Qualifier: Diagnosis of  By: [name], [name]    . Low testosterone 10/07/2015  . Mobitz type 1 second degree atrioventricular block    a. Event monitor 2017 - 1 beat.  . Obstructive sleep apnea 12/06/2013   NPSG 11/14/13- Severe OSA, AHI 86/ hr with loud snore, desat ot 80%, titrated to CPAP 12, weight 215 lbs   . Overweight 10/23/2008   Qualifier: Diagnosis of  By: [name], MD, [name] [name]    . Paroxysmal atrial flutter ([place])   . Plantar fasciitis of right foot 10/21/2012  . Premature atrial contractions   . Prostate cancer ([place])   . PVC's (premature ventricular contractions)   . Sinus tachycardia 10/27/2013   Persistent mild sinus tachycardia, October, 2015   //   48 hour Holter, October, 2015, rare PACs and  PVCs. Heart rate range 70-128, mean heart rate 95 during the daytime, mean heart rate 85 during the resting hours.   //   Patient was diagnosed with severe sleep apnea. C Pap was started and his resting sinus tachycardia improved greatly.   . TRANSAMINASES, SERUM, ELEVATED 11/30/2005   Qualifier: Diagnosis of  By: [name], [name]    . Type 2 diabetes mellitus, uncontrolled ([place]) 04/14/2012  . URI (upper respiratory infection) 06/22/2015    Past Surgical History:  Procedure Laterality Date  . BIOPSY  10/19/2017   Procedure: BIOPSY;  Surgeon: Juanita Craver, MD;  [facility]: [facility];  Service: Endoscopy;;  . COLONOSCOPY WITH PROPOFOL N/A 10/19/2017   Procedure: COLONOSCOPY WITH PROPOFOL;  Surgeon: Juanita Craver, MD;  [facility];  Service: Endoscopy;  Laterality: N/A;  . HERNIA REPAIR    . POLYPECTOMY  10/19/2017   Procedure: POLYPECTOMY;  Surgeon: Juanita Craver, MD;  [facility];  Service: Endoscopy;;  . PROSTATE BIOPSY N/A   . rotator cuff surgery  2012   Dr. [name]    There were no vitals filed for this visit.   Subjective Assessment - 10/26/17 1417    Subjective  Back pain started Janurary 01/2017 when in [place]. Walking on uneven sidwalks and walked into drops. Patient does alot of physical yardwork.  Patient sits on lawn mower. Patient has to put in 3-4 hours of yardwork.  Patient has a foam roll    Patient Stated Goals  relieve pain in lumbar and avoid surgery; strengthen the core    Currently in Pain?  Yes    Pain Score  7     Pain Location  Back    Pain Orientation  Lower    Pain Descriptors / Indicators  Dull    Pain Type  Chronic pain    Pain Radiating Towards  radiates into both legs going down to the feet ( numbness and tingling in legs)     Pain Onset  More than a month ago    Aggravating Factors   picking up items, shoveling items, sitting without ice    Pain Relieving Factors  uses ice on back with activity, sitting on ice         [facility] PT  Assessment - 10/26/17 0001      Assessment   Medical Diagnosis  M51.36 Other intervertebral disc degenrative , lumbar region    Referring Provider (PT)  Ronette Deter, PA-C    Onset Date/Surgical Date  01/19/17    Prior Therapy  Yes for the back      Precautions   Precautions  Other (comment)    Precaution Comments  Current Prostate cancer      Restrictions   Weight Bearing Restrictions  No      Balance Screen   Has the patient fallen in the past 6 months  No    Has the patient had a decrease in activity level because of a fear of falling?   No    Is the patient reluctant to leave their home because of a fear of falling?   No      Home Film/video editor residence      Prior Function   Level of Independence  Independent    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    Leisure  travel      Cognition   Overall Cognitive Status  Within Functional Limits for tasks assessed      Observation/Other Assessments   Focus on Therapeutic Outcomes (FOTO)   51% linitation; goal is 41% limitation      Posture/Postural Control   Posture/Postural Control  Postural limitations    Postural Limitations  Decreased lumbar lordosis      ROM / Strength   AROM / PROM / Strength  AROM;PROM;Strength      AROM   Lumbar Flexion  full to the left    Lumbar Extension  decreased by 50%   reduce pain; no pain down legs after 10x   Lumbar - Right Side Bend  decreased by 25%    Lumbar - Left Side Bend  decreased by 25%      Strength   Overall Strength Comments  abdominal strength is 3/5    Right Hip Extension  4/5    Right Hip ABduction  4/5    Left Hip Extension  4/5    Left Hip ADduction  3+/5    Right Knee Flexion  4/5    Left Knee Flexion  4/5    Left Knee Extension  4/5      Palpation   Palpation comment  tenderness located on lumbar paraspinals and gluteals  Objective measurements completed on examination:  See above findings.              PT Education - 10/26/17 1550    Education Details  standing lumbar extension in mid range 5 times throughout the day for pain management    Person(s) Educated  Patient    Methods  Explanation;Demonstration    Comprehension  Verbalized understanding;Returned demonstration       PT Short Term Goals - 10/26/17 1601      PT SHORT TERM GOAL #1   Time  4    Period  Weeks    Status  New    Target Date  11/23/17      PT SHORT TERM GOAL #2   Title  report 25% less lumbar pain with gardening tasks    Time  4    Period  Weeks    Status  New    Target Date  11/23/17      PT SHORT TERM GOAL #3   Title  demonnstrate and verbalize correct body mechanics for home and work tasks    Time  4    Period  Weeks    Status  New    Target Date  11/23/17        PT Long Term Goals - 10/26/17 1602      PT LONG TERM GOAL #1   Title  be independent in advanced HEP    Time  6    Period  Weeks    Status  New    Target Date  12/07/17      PT LONG TERM GOAL #2   Title  reduce FOTO to < or = to 41% limitation    Time  6    Period  Weeks    Status  New    Target Date  12/07/17      PT LONG TERM GOAL #3   Title  perform gardening with minimal to moderate low back pain using correct body mechanics    Time  6    Period  Weeks    Status  New    Target Date  12/07/17      PT LONG TERM GOAL #4   Title  report a 50% reduction in the frequency and intensity of numbness and tingling with home and work tasks    Time  6    Period  Weeks    Status  New    Target Date  12/07/17      PT LONG TERM GOAL #5   Title  sit for 30 minutes without using ice due to intensity of lumbar pain decreased >/= 50%    Time  6    Period  Weeks    Status  New    Target Date  12/07/17             Plan - 10/26/17 1551    Clinical Impression Statement  Patient has chronic back pain with acute flare-up 01/19/2017.  Patient was walking in [place] with unlevel surface  that may of increased back pain.  Patient reports pain level is 7/10 intermittently with sitting without ice, shoveling dirt, and picking up items.  Patient reports intermittent numbness and tingling in bilateral legs. Lumbar extension decreased by 50%, flexion is full with deviation to the left, and bilateral rotation is decreased by 25%.  Patient lumbar extension reduces his lumbar pain and did not produce pain down legs.  Patient MRI showed spinal canal stenosis  and several disc herniation form L4-L5. Patient was diagnosised with prostate cancer on 10/19/2017 and will be having radioactive seeds placed in on 12/08/2017.  Once the radioactive seeds are placed he is not allowed to have treatment for 2 months.  Bilateral hip strength and knee strength is 4/5.  Patient will benefit from skilled therapy to improve lumbar mobility and strength with pain management.     History and Personal Factors relevant to plan of care:  Prostate cancer 10/19/2017; Radioactive seeds scheduled for placement on 12/08/2017; Diabetes; Atrial fibrillation and flutter 07/13/2017; MRI shows impringement on left L4 nerve and mild canal stenosis    Clinical Presentation  Evolving    Clinical Presentation due to:  making it difficulty to perform daily activities; has to frequently sit with ice on back for pain management    Clinical Decision Making  Moderate    Rehab Potential  Excellent    Clinical Impairments Affecting Rehab Potential  Prostate cancer 10/19/2017; Radioactive seeds scheduled for placement on 12/08/2017; Diabetes; Atrial fibrillation and flutter 07/13/2017; MRI shows impringement on left L4 nerve and mild canal stenosis    PT Frequency  2x / week    PT Duration  6 weeks    PT Treatment/Interventions  Cryotherapy;Electrical Stimulation;Moist Heat;Traction;Therapeutic exercise;Therapeutic activities;Neuromuscular re-education;Patient/family education;Manual techniques;Passive range of motion;Dry needling;Taping    PT Next  Visit Plan  try lumbar traction; dry needling to lumbar; soft tissue work; Economist with daily tasks; pelvic floor exercise with core stabilization    Consulted and Agree with Plan of Care  Patient       Patient will benefit from skilled therapeutic intervention in order to improve the following deficits and impairments:  Increased fascial restricitons, Pain, Decreased mobility, Increased muscle spasms, Decreased activity tolerance, Decreased range of motion, Decreased strength  Visit Diagnosis: Acute bilateral low back pain with bilateral sciatica - Plan: PT plan of care cert/re-cert  Muscle weakness (generalized) - Plan: PT plan of care cert/re-cert     Problem List Patient Active Problem List   Diagnosis Date Noted  . Malignant neoplasm of prostate ([place]) 09/28/2017  . Atrial fibrillation and flutter ([place]) 07/13/2017  . Hypertension associated with diabetes ([place]) 04/01/2016  . Low testosterone 10/07/2015  . Infection of urinary tract 06/22/2015  . URI (upper respiratory infection) 06/22/2015  . Family history of premature CAD 03/20/2015  . Obstructive sleep apnea 12/06/2013  . Sinus tachycardia 10/27/2013  . Ejection fraction   . Plantar fasciitis of right foot 10/21/2012  . Type 2 diabetes mellitus, uncontrolled ([place]) 04/14/2012  . Anxiety 02/03/2011  . Overweight 10/23/2008  . Hereditary and idiopathic peripheral neuropathy 12/06/2006  . Insomnia 12/06/2006  . TRANSAMINASES, SERUM, ELEVATED 11/30/2005  . Hyperlipidemia 11/29/2005   Earlie Counts, PT 10/26/17 4:06 PM   [facility] [address] [place], [place], [address] Phone: [phone]   Fax:  [phone]  Name: JIMMYLEE RATTERREE MRN: [id number] Date of Birth: [birth year]

## 2017-10-27 ENCOUNTER — Ambulatory Visit: Payer: BLUE CROSS/BLUE SHIELD | Admitting: Physical Therapy

## 2017-10-27 ENCOUNTER — Encounter: Payer: Self-pay | Admitting: Physical Therapy

## 2017-10-27 DIAGNOSIS — M5441 Lumbago with sciatica, right side: Secondary | ICD-10-CM

## 2017-10-27 DIAGNOSIS — M5442 Lumbago with sciatica, left side: Principal | ICD-10-CM

## 2017-10-27 DIAGNOSIS — M6281 Muscle weakness (generalized): Secondary | ICD-10-CM

## 2017-10-27 NOTE — Therapy (Signed)
[facility] [address] [place], [place], [address] Phone: [phone]   Fax:  [phone]  Physical Therapy Treatment  Patient Details  Name: Brandon Frank MRN: [id number] Date of Birth: Mar 12, 1955 Referring Provider (PT): Ronette Deter, [place]   Encounter Date: 10/27/2017  PT End of Session - 10/27/17 0853    Visit Number  2    Date for PT Re-Evaluation  12/07/17    Authorization Type  BCBS    Authorization - Visit Number  2    Authorization - Number of Visits  30    PT Start Time  0845    PT Stop Time  0930    PT Time Calculation (min)  45 min    Activity Tolerance  Patient tolerated treatment well    Behavior During Therapy  [facility] for tasks assessed/performed       Past Medical History:  Diagnosis Date  . Anxiety 02/03/2011  . Atrial fibrillation and flutter ([place]) 07/13/2017  . Chronic low back pain   . Family history of premature CAD 03/20/2015  . Hereditary and idiopathic peripheral neuropathy 12/06/2006   Qualifier: Diagnosis of  By: [name], [name]    . Hyperlipidemia   . Hypertension associated with diabetes ([place]) 04/01/2016  . Infection of urinary tract 06/22/2015  . Insomnia 12/06/2006   Qualifier: Diagnosis of  By: [name], [name]    . Low testosterone 10/07/2015  . Mobitz type 1 second degree atrioventricular block    a. Event monitor 2017 - 1 beat.  . Obstructive sleep apnea 12/06/2013   NPSG 11/14/13- Severe OSA, AHI 86/ hr with loud snore, desat ot 80%, titrated to CPAP 12, weight 215 lbs   . Overweight 10/23/2008   Qualifier: Diagnosis of  By: [name], MD, [name] [name]    . Paroxysmal atrial flutter ([place])   . Plantar fasciitis of right foot 10/21/2012  . Premature atrial contractions   . Prostate cancer ([place])   . PVC's (premature ventricular contractions)   . Sinus tachycardia 10/27/2013   Persistent mild sinus tachycardia, October, 2015   //   48 hour Holter, October, 2015, rare PACs and  PVCs. Heart rate range 70-128, mean heart rate 95 during the daytime, mean heart rate 85 during the resting hours.   //   Patient was diagnosed with severe sleep apnea. C Pap was started and his resting sinus tachycardia improved greatly.   . TRANSAMINASES, SERUM, ELEVATED 11/30/2005   Qualifier: Diagnosis of  By: [name], [name]    . Type 2 diabetes mellitus, uncontrolled ([place]) 04/14/2012  . URI (upper respiratory infection) 06/22/2015    Past Surgical History:  Procedure Laterality Date  . BIOPSY  10/19/2017   Procedure: BIOPSY;  Surgeon: Juanita Craver, MD;  [facility];  Service: Endoscopy;;  . COLONOSCOPY WITH PROPOFOL N/A 10/19/2017   Procedure: COLONOSCOPY WITH PROPOFOL;  Surgeon: Juanita Craver, MD;  [facility];  Service: Endoscopy;  Laterality: N/A;  . HERNIA REPAIR    . POLYPECTOMY  10/19/2017   Procedure: POLYPECTOMY;  Surgeon: Juanita Craver, MD;  [facility];  Service: Endoscopy;;  . PROSTATE BIOPSY N/A   . rotator cuff surgery  2012   Dr. [name]    There were no vitals filed for this visit.  Subjective Assessment - 10/27/17 0849    Subjective  The Tramadol and standing with back extension is helping.     Patient Stated Goals  relieve pain in lumbar and avoid  surgery; strengthen the core    Currently in Pain?  Yes    Pain Score  4     Pain Location  Back    Pain Orientation  Lower    Pain Descriptors / Indicators  Dull    Pain Type  Chronic pain    Pain Radiating Towards  radiates into both legs going down to the feet (numbness and tingling in legs)    Pain Onset  More than a month ago    Pain Frequency  Constant    Aggravating Factors   Picking up items, shoveling items, sitting without ice    Pain Relieving Factors  uses ice on back with activity, sitting on ice    Multiple Pain Sites  No                       [facility] Adult PT Treatment/Exercise - 10/27/17 0001      Manual Therapy   Manual Therapy  Soft tissue mobilization     Soft tissue mobilization  low thoracic and lumbar paraspinals       Trigger Point Dry Needling - 10/27/17 0912    Consent Given?  Yes    Education Handout Provided  Yes    Muscles Treated Upper Body  --   T9-L5 multifidi bil.    Muscles Treated Lower Body  --   trigger point response, elongation of tissue          PT Education - 10/27/17 0909    Education Details  Access Code: [email or web address] ; information on dry needling    Person(s) Educated  Patient    Methods  Explanation;Demonstration;Verbal cues;Handout    Comprehension  Returned demonstration;Verbalized understanding       PT Short Term Goals - 10/26/17 1601      PT SHORT TERM GOAL #1   Time  4    Period  Weeks    Status  New    Target Date  11/23/17      PT SHORT TERM GOAL #2   Title  report 25% less lumbar pain with gardening tasks    Time  4    Period  Weeks    Status  New    Target Date  11/23/17      PT SHORT TERM GOAL #3   Title  demonnstrate and verbalize correct body mechanics for home and work tasks    Time  4    Period  Weeks    Status  New    Target Date  11/23/17        PT Long Term Goals - 10/26/17 1602      PT LONG TERM GOAL #1   Title  be independent in advanced HEP    Time  6    Period  Weeks    Status  New    Target Date  12/07/17      PT LONG TERM GOAL #2   Title  reduce FOTO to < or = to 41% limitation    Time  6    Period  Weeks    Status  New    Target Date  12/07/17      PT LONG TERM GOAL #3   Title  perform gardening with minimal to moderate low back pain using correct body mechanics    Time  6    Period  Weeks    Status  New    Target Date  12/07/17  PT LONG TERM GOAL #4   Title  report a 50% reduction in the frequency and intensity of numbness and tingling with home and work tasks    Time  6    Period  Weeks    Status  New    Target Date  12/07/17      PT LONG TERM GOAL #5   Title  sit for 30 minutes without using ice due to intensity of lumbar pain  decreased >/= 50%    Time  6    Period  Weeks    Status  New    Target Date  12/07/17            Plan - 10/27/17 0909    Clinical Impression Statement  Patient had increased lumbar extension and reduction in muscle spasms after manual work.  Patient exercises did not increase his pain.  Patient needed tactile cues to full brace his abdominals. Patient will benefit from skilled therapy to improve lumbar mobility and strength with pain management.     Rehab Potential  Excellent    Clinical Impairments Affecting Rehab Potential  Prostate cancer 10/19/2017; Radioactive seeds scheduled for placement on 12/08/2017; Diabetes; Atrial fibrillation and flutter 07/13/2017; MRI shows impringement on left L4 nerve and mild canal stenosis    PT Frequency  2x / week    PT Duration  6 weeks    PT Treatment/Interventions  Cryotherapy;Electrical Stimulation;Moist Heat;Traction;Therapeutic exercise;Therapeutic activities;Neuromuscular re-education;Patient/family education;Manual techniques;Passive range of motion;Dry needling;Taping    PT Next Visit Plan  try lumbar traction; asse response to dry needling to lumbar; soft tissue work; Economist with daily tasks; pelvic floor exercise with core stabilization    PT Home Exercise Plan  Access Code: [email or web address]     Consulted and Agree with Plan of Care  Patient       Patient will benefit from skilled therapeutic intervention in order to improve the following deficits and impairments:  Increased fascial restricitons, Pain, Decreased mobility, Increased muscle spasms, Decreased activity tolerance, Decreased range of motion, Decreased strength  Visit Diagnosis: Acute bilateral low back pain with bilateral sciatica  Muscle weakness (generalized)     Problem List Patient Active Problem List   Diagnosis Date Noted  . Malignant neoplasm of prostate ([place]) 09/28/2017  . Atrial fibrillation and flutter ([place]) 07/13/2017  . Hypertension associated with diabetes  ([place]) 04/01/2016  . Low testosterone 10/07/2015  . Infection of urinary tract 06/22/2015  . URI (upper respiratory infection) 06/22/2015  . Family history of premature CAD 03/20/2015  . Obstructive sleep apnea 12/06/2013  . Sinus tachycardia 10/27/2013  . Ejection fraction   . Plantar fasciitis of right foot 10/21/2012  . Type 2 diabetes mellitus, uncontrolled ([place]) 04/14/2012  . Anxiety 02/03/2011  . Overweight 10/23/2008  . Hereditary and idiopathic peripheral neuropathy 12/06/2006  . Insomnia 12/06/2006  . TRANSAMINASES, SERUM, ELEVATED 11/30/2005  . Hyperlipidemia 11/29/2005    Earlie Counts, PT 10/27/17 9:31 AM   [facility] [address] [place], [place], [address] Phone: [phone]   Fax:  [phone]  Name: ZAYVIAN MCMURTRY MRN: [id number] Date of Birth: July 11, 1955

## 2017-10-27 NOTE — Patient Instructions (Addendum)
Trigger Point Dry Needling  . What is Trigger Point Dry Needling (DN)? o DN is a physical therapy technique used to treat muscle pain and dysfunction. Specifically, DN helps deactivate muscle trigger points (muscle knots).  o A thin filiform needle is used to penetrate the skin and stimulate the underlying trigger point. The goal is for a local twitch response (LTR) to occur and for the trigger point to relax. No medication of any kind is injected during the procedure.   . What Does Trigger Point Dry Needling Feel Like?  o The procedure feels different for each individual patient. Some patients report that they do not actually feel the needle enter the skin and overall the process is not painful. Very mild bleeding may occur. However, many patients feel a deep cramping in the muscle in which the needle was inserted. This is the local twitch response.   [name] How Will I feel after the treatment? o Soreness is normal, and the onset of soreness may not occur for a few hours. Typically this soreness does not last longer than two days.  o Bruising is uncommon, however; ice can be used to decrease any possible bruising.  o In rare cases feeling tired or nauseous after the treatment is normal. In addition, your symptoms may get worse before they get better, this period will typically not last longer than 24 hours.   . What Can I do After My Treatment? o Increase your hydration by drinking more water for the next 24 hours. o You may place ice or heat on the areas treated that have become sore, however, do not use heat on inflamed or bruised areas. Heat often brings more relief post needling. o You can continue your regular activities, but vigorous activity is not recommended initially after the treatment for 24 hours. o DN is best combined with other physical therapy such as strengthening, stretching, and other therapies.    [facility] [address] [place], [place]  [address] Phone # [phone] Fax [phone] Access Code: [email or web address]  URL: [email or web address]  Date: 10/27/2017  Prepared by: Earlie Counts   Exercises  Seated Hamstring Stretch - 2 reps - 1 sets - 30 sec hold - 1x daily - 7x weekly  Seated Piriformis Stretch with Trunk Bend - 2 reps - 1 sets - 30 sec hold - 1x daily - 7x weekly  Straight Leg Raise Nerve Flossing - 5 reps - 1 sets - 2x daily - 7x weekly  Supine Peroneal Nerve Glide - 5 reps - 1 sets - 1x daily - 7x weekly  Supine Butterfly Groin Stretch - 1 reps - 1 sets - 1 min hold - 1x daily - 7x weekly  Supine Transversus Abdominis Bracing with Pelvic Floor Contraction - 5 reps - 1 sets - 10 sec hold - 2x daily - 7x weekly

## 2017-10-28 ENCOUNTER — Encounter: Payer: Self-pay | Admitting: Internal Medicine

## 2017-10-28 ENCOUNTER — Ambulatory Visit (INDEPENDENT_AMBULATORY_CARE_PROVIDER_SITE_OTHER): Payer: BLUE CROSS/BLUE SHIELD | Admitting: Internal Medicine

## 2017-10-28 VITALS — BP 116/80 | HR 89 | Ht 70.0 in | Wt 213.6 lb

## 2017-10-28 DIAGNOSIS — G4733 Obstructive sleep apnea (adult) (pediatric): Secondary | ICD-10-CM

## 2017-10-28 DIAGNOSIS — I4892 Unspecified atrial flutter: Secondary | ICD-10-CM

## 2017-10-28 DIAGNOSIS — Z23 Encounter for immunization: Secondary | ICD-10-CM

## 2017-10-28 DIAGNOSIS — I4891 Unspecified atrial fibrillation: Secondary | ICD-10-CM

## 2017-10-28 MED ORDER — ZALEPLON 5 MG PO CAPS: ORAL_CAPSULE | ORAL | 5 refills | Status: DC

## 2017-10-28 NOTE — Patient Instructions (Signed)
Order- Patient requests change from [place] to a new DME, replace old CPAP machine, changing to autopap 5-15, mask of choice, humidifier, supplies, AirView    Dx OSA  Order- Flu vax standard  Please call if we can help

## 2017-10-28 NOTE — Progress Notes (Signed)
HPI Male former smoker followed for OSA, insomnia, GERD by DM 2/ peripheral neuropathy, A. fib, HBP, prostate cancer, Hx calcified lymph nodes in chest with Neg PPD NPSG 11/14/13- Severe OSA, AHI 86/ hr with loud snore, desat ot 80%, titrated to CPAP 12, weight 215 lbs  -----------------------------------------------------------------------------  09/08/2015-62 year old male followed for OSA, insomnia, GERD complicated by DM 2/peripheral neuropathy, A. fib, CPAP 12/[facility] FOLLOWS FOR: pt states wears CPAP 7 hr nightly. feels pressure & mask are okay. no supplies needed. c/o dry mouthX3-54mo FBP:ZWCHENI  10/28/2017- 62 year old male former smoker followed for OSA, insomnia, GERD complicated by DM 2/ peripheral neuropathy, A. fib, HBP, prostate cancer,  CPAP 12/[employer] He disliked dealing with the business office of [place].  We think he is eligible for a replacement machine.  Comfortable with his CPAP. Download 83% compliance AHI 0.8/hour.  He thinks maybe pressure is a little high-it dries his mouth.  ROS-see HPI   + sign = positive Constitutional:   No-   weight loss, night sweats, fevers, chills, +fatigue, lassitude. HEENT:   No-  headaches, difficulty swallowing, tooth/dental problems, sore throat,       No-  sneezing, itching, ear ache, nasal congestion, post nasal drip,  CV:  +  chest pain, orthopnea, PND, swelling in lower extremities, anasarca,                                                        dizziness, palpitations Resp: No-   shortness of breath with exertion or at rest.              No-   productive cough,  No non-productive cough,  No- coughing up of blood.              No-   change in color of mucus.  No- wheezing.   Skin: No-   rash or lesions. GI:  No-   heartburn, indigestion, abdominal pain, nausea, GU:  MS:  No-   joint pain or swelling.  Neuro-     nothing unusual Psych:  No- change in mood or affect. No depression or +anxiety.  No memory loss.  OBJ- Physical  Exam General- Alert, Oriented, Affect-appropriate, Distress- none acute, not obese Skin- rash-none, lesions- none, excoriation- none Lymphadenopathy- none Head- atraumatic            Eyes- Gross vision intact, PERRLA, conjunctivae and secretions clear            Ears- Hearing, canals-normal            Nose- Clear, no-Septal dev, mucus, polyps, erosion, perforation             Throat- Mallampati III , mucosa clear , drainage- none, tonsils- atrophic Neck- flexible , trachea midline, no stridor , thyroid nl, carotid no bruit Chest - symmetrical excursion , unlabored           Heart/CV- IRR+ , no murmur , no gallop  , no rub, nl s1 s2                           - JVD- none , edema- none, stasis changes- none, varices- none           Lung- clear to P&A, wheeze- none, cough- none , dullness-none, rub- none  Chest wall-  Abd- Br/ Gen/ Rectal- Not done, not indicated Extrem- cyanosis- none, clubbing, none, atrophy- none, strength- nl Neuro- grossly intact to observation

## 2017-10-31 NOTE — Assessment & Plan Note (Signed)
We can replace his DME and his old CPAP machine auto 5-15

## 2017-10-31 NOTE — Assessment & Plan Note (Signed)
Is probably in atrial fibrillation at this visit but rates his very well controlled.  Followed by cardiology. Plan-flu vaccine

## 2017-11-01 ENCOUNTER — Telehealth: Payer: Self-pay | Admitting: Internal Medicine

## 2017-11-01 NOTE — Telephone Encounter (Signed)
ATC Rodney at number left ext is a vm states Barbaraann Rondo is not available after 1530. Will attempt to call back tomorrow at [phone].

## 2017-11-02 ENCOUNTER — Ambulatory Visit: Payer: BLUE CROSS/BLUE SHIELD | Admitting: Physical Therapy

## 2017-11-02 ENCOUNTER — Encounter: Payer: Self-pay | Admitting: Physical Therapy

## 2017-11-02 DIAGNOSIS — M5442 Lumbago with sciatica, left side: Secondary | ICD-10-CM | POA: Diagnosis not present

## 2017-11-02 DIAGNOSIS — M5441 Lumbago with sciatica, right side: Secondary | ICD-10-CM

## 2017-11-02 DIAGNOSIS — M6281 Muscle weakness (generalized): Secondary | ICD-10-CM

## 2017-11-02 NOTE — Therapy (Signed)
[facility] [address] [place], [place], [address] Phone: [phone]   Fax:  [phone]  Physical Therapy Treatment  Patient Details  Name: SIRIUS WOODFORD MRN: [id number] Date of Birth: [birth year] Referring Provider (PT): Ronette Deter, [place]   Encounter Date: 11/02/2017  PT End of Session - 11/02/17 1617    Visit Number  3    Date for PT Re-Evaluation  12/07/17    Authorization Type  BCBS    Authorization - Visit Number  3    Authorization - Number of Visits  30    PT Start Time  2774    PT Stop Time  1623    PT Time Calculation (min)  53 min    Activity Tolerance  Patient tolerated treatment well    Behavior During Therapy  [facility] for tasks assessed/performed       Past Medical History:  Diagnosis Date  . Anxiety 02/03/2011  . Atrial fibrillation and flutter ([place]) 07/13/2017  . Chronic low back pain   . Family history of premature CAD 03/20/2015  . Hereditary and idiopathic peripheral neuropathy 12/06/2006   Qualifier: Diagnosis of  By: [name], [name]    . Hyperlipidemia   . Hypertension associated with diabetes ([place]) 04/01/2016  . Infection of urinary tract 06/22/2015  . Insomnia 12/06/2006   Qualifier: Diagnosis of  By: [name], [name]    . Low testosterone 10/07/2015  . Mobitz type 1 second degree atrioventricular block    a. Event monitor 2017 - 1 beat.  . Obstructive sleep apnea 12/06/2013   NPSG 11/14/13- Severe OSA, AHI 86/ hr with loud snore, desat ot 80%, titrated to CPAP 12, weight 215 lbs   . Overweight 10/23/2008   Qualifier: Diagnosis of  By: [name], MD, [name] [name]    . Paroxysmal atrial flutter ([place])   . Plantar fasciitis of right foot 10/21/2012  . Premature atrial contractions   . Prostate cancer ([place])   . PVC's (premature ventricular contractions)   . Sinus tachycardia 10/27/2013   Persistent mild sinus tachycardia, October, 2015   //   48 hour Holter, October, 2015, rare PACs and  PVCs. Heart rate range 70-128, mean heart rate 95 during the daytime, mean heart rate 85 during the resting hours.   //   Patient was diagnosed with severe sleep apnea. C Pap was started and his resting sinus tachycardia improved greatly.   . TRANSAMINASES, SERUM, ELEVATED 11/30/2005   Qualifier: Diagnosis of  By: [name], [name]    . Type 2 diabetes mellitus, uncontrolled ([place]) 04/14/2012  . URI (upper respiratory infection) 06/22/2015    Past Surgical History:  Procedure Laterality Date  . BIOPSY  10/19/2017   Procedure: BIOPSY;  Surgeon: Juanita Craver, MD;  Location: [facility];  Service: Endoscopy;;  . COLONOSCOPY WITH PROPOFOL N/A 10/19/2017   Procedure: COLONOSCOPY WITH PROPOFOL;  Surgeon: Juanita Craver, MD;  Location: [facility];  Service: Endoscopy;  Laterality: N/A;  . HERNIA REPAIR    . POLYPECTOMY  10/19/2017   Procedure: POLYPECTOMY;  Surgeon: Juanita Craver, MD;  Location: [facility];  Service: Endoscopy;;  . PROSTATE BIOPSY N/A   . rotator cuff surgery  2012   Dr. [name]    There were no vitals filed for this visit.  Subjective Assessment - 11/02/17 1533    Subjective  The dry needling has helped.     Patient Stated Goals  relieve pain in lumbar and avoid surgery; strengthen the core  Currently in Pain?  Yes    Pain Score  3     Pain Orientation  Lower    Pain Descriptors / Indicators  Dull    Pain Type  Chronic pain    Pain Radiating Towards  radiates into both legs going down to the feet (numbness and tinging in legs)     Pain Onset  More than a month ago    Pain Frequency  Constant    Aggravating Factors   Picking up items, shoveling items, sitting without ice    Pain Relieving Factors  uses ice on back with activity, sitting on ice     Multiple Pain Sites  No                       [facility] Adult PT Treatment/Exercise - 11/02/17 0001      Exercises   Exercises  Lumbar      Modalities   Modalities  Traction      Traction   Type of Traction   Lumbar    Min (lbs)  40    Max (lbs)  80    Time  17 min      Manual Therapy   Manual Therapy  Soft tissue mobilization    Soft tissue mobilization  low thoracic and lumbar paraspinals       Trigger Point Dry Needling - 11/02/17 1540    Consent Given?  Yes    Muscles Treated Upper Body  --   L1-L5 multifidi; bil. QL   Muscles Treated Lower Body  --   elongation to tissue; trigger point response            PT Short Term Goals - 11/02/17 1622      PT SHORT TERM GOAL #1   Title  be independent in initial HEP    Time  4    Period  Weeks    Status  On-going      PT SHORT TERM GOAL #2   Title  report 25% less lumbar pain with gardening tasks    Time  4    Period  Weeks    Status  On-going      PT SHORT TERM GOAL #3   Title  demonnstrate and verbalize correct body mechanics for home and work tasks    Time  4    Period  Weeks    Status  On-going        PT Long Term Goals - 10/26/17 1602      PT LONG TERM GOAL #1   Title  be independent in advanced HEP    Time  6    Period  Weeks    Status  New    Target Date  12/07/17      PT LONG TERM GOAL #2   Title  reduce FOTO to < or = to 41% limitation    Time  6    Period  Weeks    Status  New    Target Date  12/07/17      PT LONG TERM GOAL #3   Title  perform gardening with minimal to moderate low back pain using correct body mechanics    Time  6    Period  Weeks    Status  New    Target Date  12/07/17      PT LONG TERM GOAL #4   Title  report a 50% reduction in the frequency and intensity  of numbness and tingling with home and work tasks    Time  6    Period  Weeks    Status  New    Target Date  12/07/17      PT LONG TERM GOAL #5   Title  sit for 30 minutes without using ice due to intensity of lumbar pain decreased >/= 50%    Time  6    Period  Weeks    Status  New    Target Date  12/07/17            Plan - 11/02/17 1618    Clinical Impression Statement  After treaction, patient had no  leg or hip pain just back pain. Patient is understanding how to manage his lumbar pain.  Patient is having a hard time accepting his new normal to protect his back.  Patient was able to tolerate the lumbar traction without increased pain. Patient will benefit from skilled therapy to improve lumbar mobility and strength with pain management.     Rehab Potential  Excellent    Clinical Impairments Affecting Rehab Potential  Prostate cancer 10/19/2017; Radioactive seeds scheduled for placement on 12/08/2017; Diabetes; Atrial fibrillation and flutter 07/13/2017; MRI shows impringement on left L4 nerve and mild canal stenosis    PT Frequency  2x / week    PT Duration  6 weeks    PT Treatment/Interventions  Cryotherapy;Electrical Stimulation;Moist Heat;Traction;Therapeutic exercise;Therapeutic activities;Neuromuscular re-education;Patient/family education;Manual techniques;Passive range of motion;Dry needling;Taping    PT Next Visit Plan  assess if traction has helped; body mechanics with daily tasks; core stabilization    PT Home Exercise Plan  Access Code: [email or web address]     Recommended Other Services  MD signed initial eval    Consulted and Agree with Plan of Care  Patient       Patient will benefit from skilled therapeutic intervention in order to improve the following deficits and impairments:  Increased fascial restricitons, Pain, Decreased mobility, Increased muscle spasms, Decreased activity tolerance, Decreased range of motion, Decreased strength  Visit Diagnosis: Acute bilateral low back pain with bilateral sciatica  Muscle weakness (generalized)     Problem List Patient Active Problem List   Diagnosis Date Noted  . Malignant neoplasm of prostate ([place]) 09/28/2017  . Atrial fibrillation and flutter ([place]) 07/13/2017  . Hypertension associated with diabetes ([place]) 04/01/2016  . Low testosterone 10/07/2015  . Infection of urinary tract 06/22/2015  . URI (upper respiratory infection) 06/22/2015   . Family history of premature CAD 03/20/2015  . Obstructive sleep apnea 12/06/2013  . Sinus tachycardia 10/27/2013  . Ejection fraction   . Plantar fasciitis of right foot 10/21/2012  . Type 2 diabetes mellitus, uncontrolled ([place]) 04/14/2012  . Anxiety 02/03/2011  . Overweight 10/23/2008  . Hereditary and idiopathic peripheral neuropathy 12/06/2006  . Insomnia 12/06/2006  . TRANSAMINASES, SERUM, ELEVATED 11/30/2005  . Hyperlipidemia 11/29/2005    Earlie Counts, PT 11/02/17 4:25 PM   [facility] [address] [place], [place], [address] Phone: [phone]   Fax:  [phone]  Name: FRANSISCO MESSMER MRN: [id number] Date of Birth: 14-Sep-1955

## 2017-11-02 NOTE — Telephone Encounter (Signed)
Spoke with Barbaraann Rondo who stated he was not able to open up pt's sleep study that was showing up in the procedures tab and wanted to know if we could.  I stated to Barbaraann Rondo that I was able to open it up to view.  Barbaraann Rondo asked if we could fax it to [facility]. I have faxed pt's sleep study documents in attn [place].  Nothing further needed.

## 2017-11-03 ENCOUNTER — Telehealth: Payer: Self-pay | Admitting: Internal Medicine

## 2017-11-03 NOTE — Telephone Encounter (Signed)
Dr. Annamaria Boots has reviewed the sleep study and returned the signed and interpreted part of pt's sleep study to me.  I have faxed it to [facility] in Rodney's attention and have received a confirmation that the fax went through.  Called [facility] in an attempt to speak to [place] but unable to speak to him. Left a detailed message for Barbaraann Rondo letting him know that I had faxed over the information to him.  Nothing further needed.

## 2017-11-03 NOTE — Telephone Encounter (Signed)
Called [facility] and spoke with Barbaraann Rondo who stated he is needing the sign and interpretation from pt's sleep study that was performed on 11/30/13.  I have printed the sleep study for CY to view and interpret.  Routing this to Dr. Annamaria Boots. Please advise.

## 2017-11-04 ENCOUNTER — Ambulatory Visit: Payer: BLUE CROSS/BLUE SHIELD | Admitting: Physical Therapy

## 2017-11-04 ENCOUNTER — Encounter: Payer: Self-pay | Admitting: Physical Therapy

## 2017-11-04 DIAGNOSIS — M6281 Muscle weakness (generalized): Secondary | ICD-10-CM

## 2017-11-04 DIAGNOSIS — M5442 Lumbago with sciatica, left side: Secondary | ICD-10-CM | POA: Diagnosis not present

## 2017-11-04 DIAGNOSIS — M5441 Lumbago with sciatica, right side: Secondary | ICD-10-CM

## 2017-11-04 NOTE — Therapy (Signed)
[facility] [address], [place], [place], [address] Phone: [phone]   Fax:  [phone]  Physical Therapy Treatment  Patient Details  Name: Brandon Frank MRN: [id number] Date of Birth: December 11, 1955 Referring Provider (PT): Ronette Deter, [place]   Encounter Date: 11/04/2017  PT End of Session - 11/04/17 1221    Visit Number  4    Date for PT Re-Evaluation  12/07/17    Authorization Type  BCBS    Authorization - Visit Number  4    Authorization - Number of Visits  30    PT Start Time  0932    PT Stop Time  1245    PT Time Calculation (min)  60 min    Activity Tolerance  Patient tolerated treatment well    Behavior During Therapy  [facility] for tasks assessed/performed       Past Medical History:  Diagnosis Date  . Anxiety 02/03/2011  . Atrial fibrillation and flutter ([place]) 07/13/2017  . Chronic low back pain   . Family history of premature CAD 03/20/2015  . Hereditary and idiopathic peripheral neuropathy 12/06/2006   Qualifier: Diagnosis of  By: [name], [name]    . Hyperlipidemia   . Hypertension associated with diabetes ([place]) 04/01/2016  . Infection of urinary tract 06/22/2015  . Insomnia 12/06/2006   Qualifier: Diagnosis of  By: [name], [name]    . Low testosterone 10/07/2015  . Mobitz type 1 second degree atrioventricular block    a. Event monitor 2017 - 1 beat.  . Obstructive sleep apnea 12/06/2013   NPSG 11/14/13- Severe OSA, AHI 86/ hr with loud snore, desat ot 80%, titrated to CPAP 12, weight 215 lbs   . Overweight 10/23/2008   Qualifier: Diagnosis of  By: [name], MD, [name] [name]    . Paroxysmal atrial flutter ([place])   . Plantar fasciitis of right foot 10/21/2012  . Premature atrial contractions   . Prostate cancer ([place])   . PVC's (premature ventricular contractions)   . Sinus tachycardia 10/27/2013   Persistent mild sinus tachycardia, October, 2015   //   48 hour Holter, October, 2015, rare PACs and  PVCs. Heart rate range 70-128, mean heart rate 95 during the daytime, mean heart rate 85 during the resting hours.   //   Patient was diagnosed with severe sleep apnea. C Pap was started and his resting sinus tachycardia improved greatly.   . TRANSAMINASES, SERUM, ELEVATED 11/30/2005   Qualifier: Diagnosis of  By: [name], [name]    . Type 2 diabetes mellitus, uncontrolled ([place]) 04/14/2012  . URI (upper respiratory infection) 06/22/2015    Past Surgical History:  Procedure Laterality Date  . BIOPSY  10/19/2017   Procedure: BIOPSY;  Surgeon: Juanita Craver, MD;  [facility];  Service: Endoscopy;;  . COLONOSCOPY WITH PROPOFOL N/A 10/19/2017   Procedure: COLONOSCOPY WITH PROPOFOL;  Surgeon: Juanita Craver, MD;  [facility]: [facility];  Service: Endoscopy;  Laterality: N/A;  . HERNIA REPAIR    . POLYPECTOMY  10/19/2017   Procedure: POLYPECTOMY;  Surgeon: Juanita Craver, MD;  [facility];  Service: Endoscopy;;  . PROSTATE BIOPSY N/A   . rotator cuff surgery  2012   Dr. [name]    There were no vitals filed for this visit.  Subjective Assessment - 11/04/17 1149    Subjective  My back is in spasms today, mostly on the right side. I have not taken anything for pain. Today I am working on  my hot tub. I so not feel the traction helped. I did not notice a difference in my pain from the traction.     Patient Stated Goals  relieve pain in lumbar and avoid surgery; strengthen the core    Currently in Pain?  Yes    Pain Location  Back    Pain Orientation  Lower    Pain Descriptors / Indicators  Dull    Pain Type  Chronic pain    Pain Radiating Towards  radiates into both legs going down to the feet (numbness and tingling in legs)    Pain Onset  More than a month ago    Pain Frequency  Constant    Aggravating Factors   picking up items, shoveling items, sitting without ice    Pain Relieving Factors  uses ice on back with activity, sitting on ice    Multiple Pain Sites  No                        [facility] Adult PT Treatment/Exercise - 11/04/17 0001      Therapeutic Activites    Therapeutic Activities  Other Therapeutic Activities    Other Therapeutic Activities  how to lift correctly and how to perform daily activities at home with spinal neutral and abdominal bracing      Exercises   Exercises  Lumbar      Lumbar Exercises: Aerobic   Nustep  seat #10; arms #12; level 1; 8 min      Lumbar Exercises: Supine   Ab Set  10 reps;5 seconds    AB Set Limitations  tactile cues to not move rib cage upward    Isometric Hip Flexion  15 reps;5 seconds    Other Supine Lumbar Exercises  hookly alternate shoulder flexion with abdominal bracing 20x      Modalities   Modalities  Traction      Traction   Type of Traction  Lumbar    Min (lbs)  40    Max (lbs)  80    Time  17 min             PT Education - 11/04/17 1221    Education Details  Access Code: [email or web address]    Person(s) Educated  Patient    Methods  Explanation;Demonstration;Verbal cues;Handout    Comprehension  Returned demonstration;Verbalized understanding       PT Short Term Goals - 11/02/17 1622      PT SHORT TERM GOAL #1   Title  be independent in initial HEP    Time  4    Period  Weeks    Status  On-going      PT SHORT TERM GOAL #2   Title  report 25% less lumbar pain with gardening tasks    Time  4    Period  Weeks    Status  On-going      PT SHORT TERM GOAL #3   Title  demonnstrate and verbalize correct body mechanics for home and work tasks    Time  4    Period  Weeks    Status  On-going        PT Long Term Goals - 10/26/17 1602      PT LONG TERM GOAL #1   Title  be independent in advanced HEP    Time  6    Period  Weeks    Status  New    Target Date  12/07/17  PT LONG TERM GOAL #2   Title  reduce FOTO to < or = to 41% limitation    Time  6    Period  Weeks    Status  New    Target Date  12/07/17      PT LONG TERM GOAL #3   Title  perform  gardening with minimal to moderate low back pain using correct body mechanics    Time  6    Period  Weeks    Status  New    Target Date  12/07/17      PT LONG TERM GOAL #4   Title  report a 50% reduction in the frequency and intensity of numbness and tingling with home and work tasks    Time  6    Period  Weeks    Status  New    Target Date  12/07/17      PT LONG TERM GOAL #5   Title  sit for 30 minutes without using ice due to intensity of lumbar pain decreased >/= 50%    Time  6    Period  Weeks    Status  New    Target Date  12/07/17            Plan - 11/04/17 1157    Clinical Impression Statement  Patient needed tactile cues to perform abdominal bracing with core exercises and his abdominals tremble due to weakness. Patient does not have change in pain but learning how to manage it.  Patient continues to learn how to strengthen his core to stabilize his spine with daily activities . Patient has learned correct body mechanics with daily activities to decrease strain in his spine. Patient will benefit from skilled therapy to improve lumbar mobility and strength with pain management.     Rehab Potential  Excellent    Clinical Impairments Affecting Rehab Potential  Prostate cancer 10/19/2017; Radioactive seeds scheduled for placement on 12/08/2017; Diabetes; Atrial fibrillation and flutter 07/13/2017; MRI shows impringement on left L4 nerve and mild canal stenosis    PT Frequency  2x / week    PT Duration  6 weeks    PT Treatment/Interventions  Cryotherapy;Electrical Stimulation;Moist Heat;Traction;Therapeutic exercise;Therapeutic activities;Neuromuscular re-education;Patient/family education;Manual techniques;Passive range of motion;Dry needling;Taping    PT Next Visit Plan  assess if traction has helped; body mechanics with daily tasks; core stabilization; dry needling; if traction did not help try e-stim    PT Home Exercise Plan  Access Code: [email or web address]     Consulted and Agree with  Plan of Care  Patient       Patient will benefit from skilled therapeutic intervention in order to improve the following deficits and impairments:  Increased fascial restricitons, Pain, Decreased mobility, Increased muscle spasms, Decreased activity tolerance, Decreased range of motion, Decreased strength  Visit Diagnosis: Acute bilateral low back pain with bilateral sciatica  Muscle weakness (generalized)     Problem List Patient Active Problem List   Diagnosis Date Noted  . Malignant neoplasm of prostate ([place]) 09/28/2017  . Atrial fibrillation and flutter ([place]) 07/13/2017  . Hypertension associated with diabetes ([place]) 04/01/2016  . Low testosterone 10/07/2015  . Infection of urinary tract 06/22/2015  . URI (upper respiratory infection) 06/22/2015  . Family history of premature CAD 03/20/2015  . Obstructive sleep apnea 12/06/2013  . Sinus tachycardia 10/27/2013  . Ejection fraction   . Plantar fasciitis of right foot 10/21/2012  . Type 2 diabetes mellitus, uncontrolled ([place]) 04/14/2012  . Anxiety  02/03/2011  . Overweight 10/23/2008  . Hereditary and idiopathic peripheral neuropathy 12/06/2006  . Insomnia 12/06/2006  . TRANSAMINASES, SERUM, ELEVATED 11/30/2005  . Hyperlipidemia 11/29/2005    Earlie Counts, PT 11/04/17 12:27 PM    [facility] [address] [place], [place], [address] Phone: [phone]   Fax:  [phone]  Name: JAXEN SAMPLES MRN: [id number] Date of Birth: Sep 21, 1955

## 2017-11-04 NOTE — Patient Instructions (Addendum)
Access Code: [email or web address]  Supine Alternating Shoulder Flexion - 2 reps - 1 sets - 1x daily - 7x weekly  Supine Transversus Abdominis Bracing with Pelvic Floor Contraction - 10 reps - 1 sets - 5 sec hold - 1x daily - 7x weekly  Hooklying Isometric Hip Flexion - 10 reps - 1 sets - 5 sec hold - 1x daily - 7x weekly  Patient Education  [facility] [address] [place], [place] [address] Phone # [phone] Fax [phone]

## 2017-11-10 ENCOUNTER — Ambulatory Visit: Payer: BLUE CROSS/BLUE SHIELD | Admitting: Physical Therapy

## 2017-11-10 ENCOUNTER — Encounter: Payer: Self-pay | Admitting: Physical Therapy

## 2017-11-10 DIAGNOSIS — M6281 Muscle weakness (generalized): Secondary | ICD-10-CM

## 2017-11-10 DIAGNOSIS — M5442 Lumbago with sciatica, left side: Principal | ICD-10-CM

## 2017-11-10 DIAGNOSIS — M5441 Lumbago with sciatica, right side: Secondary | ICD-10-CM

## 2017-11-10 NOTE — Therapy (Signed)
[facility] [address] [place], [place], [address] Phone: [phone]   Fax:  [phone]  Physical Therapy Treatment  Patient Details  Name: Brandon Frank MRN: [id number] Date of Birth: 06-01-1955 Referring Provider (PT): Ronette Deter, [place]   Encounter Date: 11/10/2017  PT End of Session - 11/10/17 1358    Visit Number  5    Date for PT Re-Evaluation  12/07/17    Authorization Type  BCBS    Authorization - Visit Number  5    Authorization - Number of Visits  30    PT Start Time  2563    PT Stop Time  1436    PT Time Calculation (min)  38 min    Activity Tolerance  Patient tolerated treatment well    Behavior During Therapy  [facility] for tasks assessed/performed       Past Medical History:  Diagnosis Date  . Anxiety 02/03/2011  . Atrial fibrillation and flutter ([place]) 07/13/2017  . Chronic low back pain   . Family history of premature CAD 03/20/2015  . Hereditary and idiopathic peripheral neuropathy 12/06/2006   Qualifier: Diagnosis of  By: [name], [name]    . Hyperlipidemia   . Hypertension associated with diabetes ([place]) 04/01/2016  . Infection of urinary tract 06/22/2015  . Insomnia 12/06/2006   Qualifier: Diagnosis of  By: [name], [name]    . Low testosterone 10/07/2015  . Mobitz type 1 second degree atrioventricular block    a. Event monitor 2017 - 1 beat.  . Obstructive sleep apnea 12/06/2013   NPSG 11/14/13- Severe OSA, AHI 86/ hr with loud snore, desat ot 80%, titrated to CPAP 12, weight 215 lbs   . Overweight 10/23/2008   Qualifier: Diagnosis of  By: [name], MD, [name] [name]    . Paroxysmal atrial flutter ([place])   . Plantar fasciitis of right foot 10/21/2012  . Premature atrial contractions   . Prostate cancer ([place])   . PVC's (premature ventricular contractions)   . Sinus tachycardia 10/27/2013   Persistent mild sinus tachycardia, October, 2015   //   48 hour Holter, October, 2015, rare PACs and  PVCs. Heart rate range 70-128, mean heart rate 95 during the daytime, mean heart rate 85 during the resting hours.   //   Patient was diagnosed with severe sleep apnea. C Pap was started and his resting sinus tachycardia improved greatly.   . TRANSAMINASES, SERUM, ELEVATED 11/30/2005   Qualifier: Diagnosis of  By: [name], [name]    . Type 2 diabetes mellitus, uncontrolled ([place]) 04/14/2012  . URI (upper respiratory infection) 06/22/2015    Past Surgical History:  Procedure Laterality Date  . BIOPSY  10/19/2017   Procedure: BIOPSY;  Surgeon: Juanita Craver, MD;  Location: [facility];  Service: Endoscopy;;  . COLONOSCOPY WITH PROPOFOL N/A 10/19/2017   Procedure: COLONOSCOPY WITH PROPOFOL;  Surgeon: Juanita Craver, MD;  Location: [facility];  Service: Endoscopy;  Laterality: N/A;  . HERNIA REPAIR    . POLYPECTOMY  10/19/2017   Procedure: POLYPECTOMY;  Surgeon: Juanita Craver, MD;  Location: [facility];  Service: Endoscopy;;  . PROSTATE BIOPSY N/A   . rotator cuff surgery  2012   Dr. [name]    There were no vitals filed for this visit.  Subjective Assessment - 11/10/17 1410    Subjective  My back and leg are no better. Tried walking yesterday and pain in my hip and down front of leg increased and made  me stop.     Currently in Pain?  Yes    Pain Score  3     Pain Location  Back    Pain Orientation  Lower    Pain Descriptors / Indicators  Constant;Aching    Multiple Pain Sites  No                       [facility] Adult PT Treatment/Exercise - 11/10/17 0001      Lumbar Exercises: Aerobic   Nustep  LE only L2 x 10 min   PTA present for status update     Lumbar Exercises: Supine   Clam  20 reps   added green band 2x10   Bent Knee Raise  5 reps;5 seconds   VC for deepen ab contraction on deceleration     Lumbar Exercises: Sidelying   Hip Abduction  --   Isometric hold bil 5-8 sec 2x, added to HEP: TC/VC for instr     Modalities   Modalities  --   Declined             PT Education - 11/10/17 1442    Education Details  HEP: supine slow march, green band clamshell, and sidelying static hold hip abduction     Person(s) Educated  Patient    Methods  Explanation;Demonstration;Tactile cues;Verbal cues;Handout    Comprehension  Returned demonstration;Verbalized understanding       PT Short Term Goals - 11/02/17 1622      PT SHORT TERM GOAL #1   Title  be independent in initial HEP    Time  4    Period  Weeks    Status  On-going      PT SHORT TERM GOAL #2   Title  report 25% less lumbar pain with gardening tasks    Time  4    Period  Weeks    Status  On-going      PT SHORT TERM GOAL #3   Title  demonnstrate and verbalize correct body mechanics for home and work tasks    Time  4    Period  Weeks    Status  On-going        PT Long Term Goals - 10/26/17 1602      PT LONG TERM GOAL #1   Title  be independent in advanced HEP    Time  6    Period  Weeks    Status  New    Target Date  12/07/17      PT LONG TERM GOAL #2   Title  reduce FOTO to < or = to 41% limitation    Time  6    Period  Weeks    Status  New    Target Date  12/07/17      PT LONG TERM GOAL #3   Title  perform gardening with minimal to moderate low back pain using correct body mechanics    Time  6    Period  Weeks    Status  New    Target Date  12/07/17      PT LONG TERM GOAL #4   Title  report a 50% reduction in the frequency and intensity of numbness and tingling with home and work tasks    Time  6    Period  Weeks    Status  New    Target Date  12/07/17      PT LONG TERM GOAL #5  Title  sit for 30 minutes without using ice due to intensity of lumbar pain decreased >/= 50%    Time  6    Period  Weeks    Status  New    Target Date  12/07/17            Plan - 11/10/17 1358    Clinical Impression Statement  Pt reports his symptoms are the same and he would like to concentrate on strengthening so no matter "what path I take ( surgery vs  no surgery) I will be stronger." Added supine core strength to his HEP today.  Pt declined traction.    Rehab Potential  Excellent    Clinical Impairments Affecting Rehab Potential  Prostate cancer 10/19/2017; Radioactive seeds scheduled for placement on 12/08/2017; Diabetes; Atrial fibrillation and flutter 07/13/2017; MRI shows impringement on left L4 nerve and mild canal stenosis    PT Frequency  2x / week    PT Duration  6 weeks    PT Treatment/Interventions  Cryotherapy;Electrical Stimulation;Moist Heat;Traction;Therapeutic exercise;Therapeutic activities;Neuromuscular re-education;Patient/family education;Manual techniques;Passive range of motion;Dry needling;Taping    PT Next Visit Plan  Core,  RT hip strength    PT Home Exercise Plan  Access Code: [email or web address]     Consulted and Agree with Plan of Care  --       Patient will benefit from skilled therapeutic intervention in order to improve the following deficits and impairments:  Increased fascial restricitons, Pain, Decreased mobility, Increased muscle spasms, Decreased activity tolerance, Decreased range of motion, Decreased strength  Visit Diagnosis: Acute bilateral low back pain with bilateral sciatica  Muscle weakness (generalized)     Problem List Patient Active Problem List   Diagnosis Date Noted  . Malignant neoplasm of prostate ([place]) 09/28/2017  . Atrial fibrillation and flutter ([place]) 07/13/2017  . Hypertension associated with diabetes ([place]) 04/01/2016  . Low testosterone 10/07/2015  . Infection of urinary tract 06/22/2015  . URI (upper respiratory infection) 06/22/2015  . Family history of premature CAD 03/20/2015  . Obstructive sleep apnea 12/06/2013  . Sinus tachycardia 10/27/2013  . Ejection fraction   . Plantar fasciitis of right foot 10/21/2012  . Type 2 diabetes mellitus, uncontrolled ([place]) 04/14/2012  . Anxiety 02/03/2011  . Overweight 10/23/2008  . Hereditary and idiopathic peripheral neuropathy 12/06/2006  .  Insomnia 12/06/2006  . TRANSAMINASES, SERUM, ELEVATED 11/30/2005  . Hyperlipidemia 11/29/2005    Aliece Honold, PTA 11/10/2017, 2:44 PM  [facility] [address] [place], [place], [address] Phone: [phone]   Fax:  [phone]  Name: Brandon Frank MRN: [id number] Date of Birth: 1955-11-12  Access Code: [email or web address]  URL: [email or web address]  Date: 11/10/2017  Prepared by: Myrene Galas   Exercises  Seated Hamstring Stretch - 2 reps - 1 sets - 30 sec hold - 1x daily - 7x weekly  Seated Piriformis Stretch with Trunk Bend - 2 reps - 1 sets - 30 sec hold - 1x daily - 7x weekly  Straight Leg Raise Nerve Flossing - 5 reps - 1 sets - 2x daily - 7x weekly  Supine Peroneal Nerve Glide - 5 reps - 1 sets - 1x daily - 7x weekly  Supine Butterfly Groin Stretch - 1 reps - 1 sets - 1 min hold - 1x daily - 7x weekly  Supine Alternating Shoulder Flexion - 2 reps - 1 sets - 1x daily - 7x weekly  Supine Transversus Abdominis Bracing with Pelvic Floor Contraction - 10 reps - 1  sets - 5 sec hold - 1x daily - 7x weekly  Hooklying Isometric Hip Flexion - 10 reps - 1 sets - 5 sec hold - 1x daily - 7x weekly  Seated Hip Abduction with Resistance - 10 reps - 2 sets - 1x daily - 7x weekly  Supine March - 10 reps - 1 sets - 5 hold - 1x daily - 7x weekly  Sidelying Hip Abduction - 3 reps - 2 sets - 8 sec hold - 1x daily - 7x weekly  Patient Education  Marine scientist  Household Activities

## 2017-11-11 ENCOUNTER — Ambulatory Visit: Payer: BLUE CROSS/BLUE SHIELD | Admitting: Physical Therapy

## 2017-11-11 ENCOUNTER — Encounter: Payer: Self-pay | Admitting: Physical Therapy

## 2017-11-11 ENCOUNTER — Other Ambulatory Visit: Payer: Self-pay | Admitting: Urology

## 2017-11-11 DIAGNOSIS — M5442 Lumbago with sciatica, left side: Secondary | ICD-10-CM | POA: Diagnosis not present

## 2017-11-11 DIAGNOSIS — M5441 Lumbago with sciatica, right side: Secondary | ICD-10-CM

## 2017-11-11 DIAGNOSIS — C61 Malignant neoplasm of prostate: Secondary | ICD-10-CM

## 2017-11-11 DIAGNOSIS — M6281 Muscle weakness (generalized): Secondary | ICD-10-CM

## 2017-11-11 NOTE — Therapy (Addendum)
[facility] [address] [place], [place], [address] Phone: [phone]   Fax:  [phone]  Physical Therapy Treatment  Patient Details  Name: Brandon Frank MRN: [id number] Date of Birth: September 29, 1955 Referring Provider (PT): Ronette Deter, [place]   Encounter Date: 11/11/2017  PT End of Session - 11/11/17 1014    Visit Number  6    Date for PT Re-Evaluation  12/07/17    Authorization Type  BCBS    Authorization - Visit Number  6    Authorization - Number of Visits  30    PT Start Time  0923    PT Stop Time  1100    PT Time Calculation (min)  46 min    Activity Tolerance  Patient tolerated treatment well    Behavior During Therapy  [facility] for tasks assessed/performed       Past Medical History:  Diagnosis Date  . Anxiety 02/03/2011  . Atrial fibrillation and flutter ([place]) 07/13/2017  . Chronic low back pain   . Family history of premature CAD 03/20/2015  . Hereditary and idiopathic peripheral neuropathy 12/06/2006   Qualifier: Diagnosis of  By: [name], [name]    . Hyperlipidemia   . Hypertension associated with diabetes ([place]) 04/01/2016  . Infection of urinary tract 06/22/2015  . Insomnia 12/06/2006   Qualifier: Diagnosis of  By: [name], [name]    . Low testosterone 10/07/2015  . Mobitz type 1 second degree atrioventricular block    a. Event monitor 2017 - 1 beat.  . Obstructive sleep apnea 12/06/2013   NPSG 11/14/13- Severe OSA, AHI 86/ hr with loud snore, desat ot 80%, titrated to CPAP 12, weight 215 lbs   . Overweight 10/23/2008   Qualifier: Diagnosis of  By: [name], MD, [name] [name]    . Paroxysmal atrial flutter ([place])   . Plantar fasciitis of right foot 10/21/2012  . Premature atrial contractions   . Prostate cancer ([place])   . PVC's (premature ventricular contractions)   . Sinus tachycardia 10/27/2013   Persistent mild sinus tachycardia, October, 2015   //   48 hour Holter, October, 2015, rare PACs and  PVCs. Heart rate range 70-128, mean heart rate 95 during the daytime, mean heart rate 85 during the resting hours.   //   Patient was diagnosed with severe sleep apnea. C Pap was started and his resting sinus tachycardia improved greatly.   . TRANSAMINASES, SERUM, ELEVATED 11/30/2005   Qualifier: Diagnosis of  By: [name], [name]    . Type 2 diabetes mellitus, uncontrolled ([place]) 04/14/2012  . URI (upper respiratory infection) 06/22/2015    Past Surgical History:  Procedure Laterality Date  . BIOPSY  10/19/2017   Procedure: BIOPSY;  Surgeon: Juanita Craver, MD;  [facility];  Service: Endoscopy;;  . COLONOSCOPY WITH PROPOFOL N/A 10/19/2017   Procedure: COLONOSCOPY WITH PROPOFOL;  Surgeon: Juanita Craver, MD;  [facility]: [facility];  Service: Endoscopy;  Laterality: N/A;  . HERNIA REPAIR    . POLYPECTOMY  10/19/2017   Procedure: POLYPECTOMY;  Surgeon: Juanita Craver, MD;  [facility]: [facility];  Service: Endoscopy;;  . PROSTATE BIOPSY N/A   . rotator cuff surgery  2012   Dr. [name]    There were no vitals filed for this visit.  Subjective Assessment - 11/11/17 1018    Subjective  I have not done my exercises. I do not feel the traction has helped my pain. I felt good after the exercises but  after an hour the pain comes back. The pain is just in my back and not my legs.     Patient Stated Goals  relieve pain in lumbar and avoid surgery; strengthen the core    Currently in Pain?  Yes    Pain Score  3     Pain Location  Back    Pain Orientation  Lower    Pain Descriptors / Indicators  Aching;Constant    Pain Type  Chronic pain    Pain Onset  More than a month ago    Pain Frequency  Constant    Aggravating Factors   picking up items, shoveling items, sitting without ice    Pain Relieving Factors  uses ice on back with activity, sitting on ice    Multiple Pain Sites  No                       [facility] Adult PT Treatment/Exercise - 11/11/17 0001      Lumbar Exercises:  Aerobic   Nustep  LE only L2 x 10 min   PTA present for status update     Lumbar Exercises: Supine   Ab Set  10 reps;5 seconds    AB Set Limitations  Vc to  contract the lower abdominals    Clam  20 reps   added green band 2x10; 10x single leg   Bent Knee Raise  5 reps;5 seconds   VC for deepen ab contraction on deceleration     Modalities   Modalities  Electrical Stimulation      Electrical Stimulation   Electrical Stimulation Location  attached to dry needles located in lumbar multifidi    Electrical Stimulation Action  stimulation to unit    Electrical Stimulation Parameters  frequency 70 ma; intensity to comfort; 5 min    Electrical Stimulation Goals  Pain      Manual Therapy   Manual Therapy  Soft tissue mobilization    Soft tissue mobilization  bilateral lumbar paraspinals       Trigger Point Dry Needling - 11/11/17 1045    Consent Given?  Yes    Muscles Treated Upper Body  --   Bil. lumbar multifidi L1-L5   Muscles Treated Lower Body  --   elongation of muscle, trigger point response            PT Short Term Goals - 11/11/17 1015      PT SHORT TERM GOAL #1   Title  be independent in initial HEP    Time  4    Period  Weeks    Status  Achieved      PT SHORT TERM GOAL #2   Title  report 25% less lumbar pain with gardening tasks    Time  4    Period  Weeks    Status  On-going      PT SHORT TERM GOAL #3   Title  demonnstrate and verbalize correct body mechanics for home and work tasks    Time  4    Period  Weeks    Status  On-going        PT Long Term Goals - 10/26/17 1602      PT LONG TERM GOAL #1   Title  be independent in advanced HEP    Time  6    Period  Weeks    Status  New    Target Date  12/07/17  PT LONG TERM GOAL #2   Title  reduce FOTO to < or = to 41% limitation    Time  6    Period  Weeks    Status  New    Target Date  12/07/17      PT LONG TERM GOAL #3   Title  perform gardening with minimal to moderate low back  pain using correct body mechanics    Time  6    Period  Weeks    Status  New    Target Date  12/07/17      PT LONG TERM GOAL #4   Title  report a 50% reduction in the frequency and intensity of numbness and tingling with home and work tasks    Time  6    Period  Weeks    Status  New    Target Date  12/07/17      PT LONG TERM GOAL #5   Title  sit for 30 minutes without using ice due to intensity of lumbar pain decreased >/= 50%    Time  6    Period  Weeks    Status  New    Target Date  12/07/17            Plan - 11/11/17 1015    Clinical Impression Statement  Pain decreased to 2/10 after dry needling and electrical stimulation. Patient continues to have difficulty with core stabilization and contracting the lower abdominals. Patient needs tactile and verbal cues to perform the correctly. Patient will be doing alot of yardwor this week. During single leg clam the moving leg is shaking due to weakness. Patient has no change in pain yet.  Lumbar traction has not made a change in pain.  Patient pain is more in his back now instead of legs. Patient  will benefit from skilled therapy  to improve core strength and keep pain centralized.     Rehab Potential  Excellent    Clinical Impairments Affecting Rehab Potential  Prostate cancer 10/19/2017; Radioactive seeds scheduled for placement on 12/08/2017; Diabetes; Atrial fibrillation and flutter 07/13/2017; MRI shows impringement on left L4 nerve and mild canal stenosis    PT Frequency  2x / week    PT Duration  6 weeks    PT Treatment/Interventions  Cryotherapy;Electrical Stimulation;Moist Heat;Traction;Therapeutic exercise;Therapeutic activities;Neuromuscular re-education;Patient/family education;Manual techniques;Passive range of motion;Dry needling;Taping    PT Next Visit Plan  Core,  RT hip strength; assess dry needling electrical stimulation    PT Home Exercise Plan  Access Code: [email or web address]     Consulted and Agree with Plan of Care  Patient        Patient will benefit from skilled therapeutic intervention in order to improve the following deficits and impairments:  Increased fascial restricitons, Pain, Decreased mobility, Increased muscle spasms, Decreased activity tolerance, Decreased range of motion, Decreased strength  Visit Diagnosis: Acute bilateral low back pain with bilateral sciatica  Muscle weakness (generalized)     Problem List Patient Active Problem List   Diagnosis Date Noted  . Malignant neoplasm of prostate ([place]) 09/28/2017  . Atrial fibrillation and flutter ([place]) 07/13/2017  . Hypertension associated with diabetes ([place]) 04/01/2016  . Low testosterone 10/07/2015  . Infection of urinary tract 06/22/2015  . URI (upper respiratory infection) 06/22/2015  . Family history of premature CAD 03/20/2015  . Obstructive sleep apnea 12/06/2013  . Sinus tachycardia 10/27/2013  . Ejection fraction   . Plantar fasciitis of right foot 10/21/2012  . Type  2 diabetes mellitus, uncontrolled ([place]) 04/14/2012  . Anxiety 02/03/2011  . Overweight 10/23/2008  . Hereditary and idiopathic peripheral neuropathy 12/06/2006  . Insomnia 12/06/2006  . TRANSAMINASES, SERUM, ELEVATED 11/30/2005  . Hyperlipidemia 11/29/2005    Earlie Counts, PT 11/11/17 11:01 AM   [facility] [address], [place], [place], [address] Phone: [phone]   Fax:  [phone]  Name: Brandon Frank MRN: [id number] Date of Birth: 1955-06-19

## 2017-11-15 ENCOUNTER — Encounter: Payer: Self-pay | Admitting: Physical Therapy

## 2017-11-15 ENCOUNTER — Ambulatory Visit: Payer: BLUE CROSS/BLUE SHIELD | Admitting: Physical Therapy

## 2017-11-15 DIAGNOSIS — M5441 Lumbago with sciatica, right side: Secondary | ICD-10-CM

## 2017-11-15 DIAGNOSIS — M6281 Muscle weakness (generalized): Secondary | ICD-10-CM

## 2017-11-15 DIAGNOSIS — M5442 Lumbago with sciatica, left side: Secondary | ICD-10-CM | POA: Diagnosis not present

## 2017-11-15 NOTE — Therapy (Signed)
[facility] [address] [place], [place], [address] Phone: [phone]   Fax:  [phone]  Physical Therapy Treatment  Patient Details  Name: Brandon Frank MRN: [id number] Date of Birth: Jul 13, 1955 Referring Provider (PT): Ronette Deter, [place]   Encounter Date: 11/15/2017  PT End of Session - 11/15/17 1224    Visit Number  7    Date for PT Re-Evaluation  12/07/17    Authorization Type  BCBS    Authorization - Visit Number  7    Authorization - Number of Visits  30    PT Start Time  1287    PT Stop Time  1228    PT Time Calculation (min)  43 min    Activity Tolerance  Patient tolerated treatment well    Behavior During Therapy  [facility] for tasks assessed/performed       Past Medical History:  Diagnosis Date  . Anxiety 02/03/2011  . Atrial fibrillation and flutter ([place]) 07/13/2017  . Chronic low back pain   . Family history of premature CAD 03/20/2015  . Hereditary and idiopathic peripheral neuropathy 12/06/2006   Qualifier: Diagnosis of  By: [name], [name]    . Hyperlipidemia   . Hypertension associated with diabetes ([place]) 04/01/2016  . Infection of urinary tract 06/22/2015  . Insomnia 12/06/2006   Qualifier: Diagnosis of  By: [name], [name]    . Low testosterone 10/07/2015  . Mobitz type 1 second degree atrioventricular block    a. Event monitor 2017 - 1 beat.  . Obstructive sleep apnea 12/06/2013   NPSG 11/14/13- Severe OSA, AHI 86/ hr with loud snore, desat ot 80%, titrated to CPAP 12, weight 215 lbs   . Overweight 10/23/2008   Qualifier: Diagnosis of  By: [name], MD, [name] [name]    . Paroxysmal atrial flutter ([place])   . Plantar fasciitis of right foot 10/21/2012  . Premature atrial contractions   . Prostate cancer ([place])   . PVC's (premature ventricular contractions)   . Sinus tachycardia 10/27/2013   Persistent mild sinus tachycardia, October, 2015   //   48 hour Holter, October, 2015, rare PACs and  PVCs. Heart rate range 70-128, mean heart rate 95 during the daytime, mean heart rate 85 during the resting hours.   //   Patient was diagnosed with severe sleep apnea. C Pap was started and his resting sinus tachycardia improved greatly.   . TRANSAMINASES, SERUM, ELEVATED 11/30/2005   Qualifier: Diagnosis of  By: [name], [name]    . Type 2 diabetes mellitus, uncontrolled ([place]) 04/14/2012  . URI (upper respiratory infection) 06/22/2015    Past Surgical History:  Procedure Laterality Date  . BIOPSY  10/19/2017   Procedure: BIOPSY;  Surgeon: Juanita Craver, MD;  Location: [facility];  Service: Endoscopy;;  . COLONOSCOPY WITH PROPOFOL N/A 10/19/2017   Procedure: COLONOSCOPY WITH PROPOFOL;  Surgeon: Juanita Craver, MD;  Location: [facility];  Service: Endoscopy;  Laterality: N/A;  . HERNIA REPAIR    . POLYPECTOMY  10/19/2017   Procedure: POLYPECTOMY;  Surgeon: Juanita Craver, MD;  Location: [facility];  Service: Endoscopy;;  . PROSTATE BIOPSY N/A   . rotator cuff surgery  2012   Dr. [name]    There were no vitals filed for this visit.  Subjective Assessment - 11/15/17 1150    Subjective  The stim and dry needling helped. It felt like it was contracting my muscles and the the relaxed. I had no spasms for  awhile. I did some outside work.     Patient Stated Goals  relieve pain in lumbar and avoid surgery; strengthen the core    Currently in Pain?  Yes    Pain Score  3     Pain Location  Back    Pain Orientation  Lower    Pain Descriptors / Indicators  Aching;Constant    Pain Type  Chronic pain    Pain Onset  More than a month ago    Pain Frequency  Constant    Aggravating Factors   picking up items, shoveling items, sitting without ice    Pain Relieving Factors  uses ice on back with activity, sitting on ice    Multiple Pain Sites  No                       [facility] Adult PT Treatment/Exercise - 11/15/17 0001      Exercises   Exercises  Lumbar      Lumbar Exercises:  Stretches   Single Knee to Chest Stretch  Right;Left;3 reps;30 seconds    Piriformis Stretch  Right;Left;2 reps;30 seconds   supine     Lumbar Exercises: Supine   Ab Set  10 reps;5 seconds    AB Set Limitations  Vc to  contract the lower abdominals    Clam  20 reps   added green band 2x10; 10x single leg   Bent Knee Raise  5 seconds;10 reps   VC for deepen ab contraction on deceleration   Dead Bug  20 reps;1 second   abdominal bracing   Dead Bug Limitations  needed VC to contract abdominals to reduce the diastasis      Lumbar Exercises: Quadruped   Single Arm Raise  10 reps;Right;Left;1 second   VC for abdominal bracing   Straight Leg Raise  10 reps;1 second   VC to contract abdominals     Electrical Stimulation   Electrical Stimulation Location  attached to dry needles located in lumbar multifidi    Electrical Stimulation Action  stimulation till feel musce contraction for dry needling    Electrical Stimulation Parameters  to patient tolerance for 5 minutes    Electrical Stimulation Goals  Pain      Manual Therapy   Manual Therapy  Soft tissue mobilization    Soft tissue mobilization  bilateral lumbar paraspinals       Trigger Point Dry Needling - 11/15/17 1212    Consent Given?  Yes    Muscles Treated Upper Body  --   bil. lumbar multifidi L1-L5   Muscles Treated Lower Body  --   trigger point response, elongation of muscel            PT Short Term Goals - 11/15/17 1224      PT SHORT TERM GOAL #2   Title  report 25% less lumbar pain with gardening tasks    Time  4    Period  Weeks    Status  Achieved      PT SHORT TERM GOAL #3   Title  demonnstrate and verbalize correct body mechanics for home and work tasks    Time  4    Period  Weeks    Status  On-going        PT Long Term Goals - 10/26/17 1602      PT LONG TERM GOAL #1   Title  be independent in advanced HEP    Time  6  Period  Weeks    Status  New    Target Date  12/07/17      PT LONG  TERM GOAL #2   Title  reduce FOTO to < or = to 41% limitation    Time  6    Period  Weeks    Status  New    Target Date  12/07/17      PT LONG TERM GOAL #3   Title  perform gardening with minimal to moderate low back pain using correct body mechanics    Time  6    Period  Weeks    Status  New    Target Date  12/07/17      PT LONG TERM GOAL #4   Title  report a 50% reduction in the frequency and intensity of numbness and tingling with home and work tasks    Time  6    Period  Weeks    Status  New    Target Date  12/07/17      PT LONG TERM GOAL #5   Title  sit for 30 minutes without using ice due to intensity of lumbar pain decreased >/= 50%    Time  6    Period  Weeks    Status  New    Target Date  12/07/17            Plan - 11/15/17 1224    Clinical Impression Statement  Pain level is 2/10 after dry needling. Patient is having reduction of back spasms after dry needling. Patient has more reaction on the right lumbar with electrical simulation compared to the left. Patient continues to have weakness in the core with his exercises due to dooming of the abdomen. Patient will benefit from skilled therapy to improve core strength and keep pain centralized.     Clinical Impairments Affecting Rehab Potential  Prostate cancer 10/19/2017; Radioactive seeds scheduled for placement on 12/08/2017; Diabetes; Atrial fibrillation and flutter 07/13/2017; MRI shows impringement on left L4 nerve and mild canal stenosis    PT Frequency  2x / week    PT Duration  6 weeks    PT Treatment/Interventions  Cryotherapy;Electrical Stimulation;Moist Heat;Traction;Therapeutic exercise;Therapeutic activities;Neuromuscular re-education;Patient/family education;Manual techniques;Passive range of motion;Dry needling;Taping    PT Next Visit Plan  Body mechanics with yard task,Core,  RT hip strength;  dry needling electrical stimulation for 1 time per week    PT Home Exercise Plan  Access Code: [email or web address]      Consulted and Agree with Plan of Care  Patient       Patient will benefit from skilled therapeutic intervention in order to improve the following deficits and impairments:  Increased fascial restricitons, Pain, Decreased mobility, Increased muscle spasms, Decreased activity tolerance, Decreased range of motion, Decreased strength  Visit Diagnosis: Acute bilateral low back pain with bilateral sciatica  Muscle weakness (generalized)     Problem List Patient Active Problem List   Diagnosis Date Noted  . Malignant neoplasm of prostate ([place]) 09/28/2017  . Atrial fibrillation and flutter ([place]) 07/13/2017  . Hypertension associated with diabetes ([place]) 04/01/2016  . Low testosterone 10/07/2015  . Infection of urinary tract 06/22/2015  . URI (upper respiratory infection) 06/22/2015  . Family history of premature CAD 03/20/2015  . Obstructive sleep apnea 12/06/2013  . Sinus tachycardia 10/27/2013  . Ejection fraction   . Plantar fasciitis of right foot 10/21/2012  . Type 2 diabetes mellitus, uncontrolled ([place]) 04/14/2012  . Anxiety 02/03/2011  . Overweight  10/23/2008  . Hereditary and idiopathic peripheral neuropathy 12/06/2006  . Insomnia 12/06/2006  . TRANSAMINASES, SERUM, ELEVATED 11/30/2005  . Hyperlipidemia 11/29/2005   Brandon Frank, PT 11/15/17 12:32 PM    [facility] [address] [place], [place], [address] Phone: [phone]   Fax:  [phone]  Name: Brandon Frank MRN: [id number] Date of Birth: October 06, 1955

## 2017-11-17 ENCOUNTER — Ambulatory Visit: Payer: BLUE CROSS/BLUE SHIELD | Admitting: Physical Therapy

## 2017-11-17 ENCOUNTER — Encounter: Payer: Self-pay | Admitting: Physical Therapy

## 2017-11-17 DIAGNOSIS — M5442 Lumbago with sciatica, left side: Secondary | ICD-10-CM | POA: Diagnosis not present

## 2017-11-17 DIAGNOSIS — M6281 Muscle weakness (generalized): Secondary | ICD-10-CM

## 2017-11-17 DIAGNOSIS — M5441 Lumbago with sciatica, right side: Secondary | ICD-10-CM

## 2017-11-17 NOTE — Therapy (Signed)
[facility] [address] [place], [place], [address] Phone: [phone]   Fax:  [phone]  Physical Therapy Treatment  Patient Details  Name: Brandon Frank MRN: [id number] Date of Birth: 24-Sep-1955 Referring Provider (PT): Ronette Deter, [place]   Encounter Date: 11/17/2017  PT End of Session - 11/17/17 0932    Visit Number  8    Date for PT Re-Evaluation  12/07/17    Authorization Type  BCBS    Authorization - Visit Number  8    Authorization - Number of Visits  30    PT Start Time  0931   Pt was satisifed with what we accomplished and did not need modalities today.   PT Stop Time  1005    PT Time Calculation (min)  34 min    Activity Tolerance  Patient tolerated treatment well    Behavior During Therapy  [facility] for tasks assessed/performed       Past Medical History:  Diagnosis Date  . Anxiety 02/03/2011  . Atrial fibrillation and flutter ([place]) 07/13/2017  . Chronic low back pain   . Family history of premature CAD 03/20/2015  . Hereditary and idiopathic peripheral neuropathy 12/06/2006   Qualifier: Diagnosis of  By: [name], [name]    . Hyperlipidemia   . Hypertension associated with diabetes ([place]) 04/01/2016  . Infection of urinary tract 06/22/2015  . Insomnia 12/06/2006   Qualifier: Diagnosis of  By: [name], [name]    . Low testosterone 10/07/2015  . Mobitz type 1 second degree atrioventricular block    a. Event monitor 2017 - 1 beat.  . Obstructive sleep apnea 12/06/2013   NPSG 11/14/13- Severe OSA, AHI 86/ hr with loud snore, desat ot 80%, titrated to CPAP 12, weight 215 lbs   . Overweight 10/23/2008   Qualifier: Diagnosis of  By: [name], MD, [name] [name]    . Paroxysmal atrial flutter ([place])   . Plantar fasciitis of right foot 10/21/2012  . Premature atrial contractions   . Prostate cancer ([place])   . PVC's (premature ventricular contractions)   . Sinus tachycardia 10/27/2013   Persistent mild sinus  tachycardia, October, 2015   //   48 hour Holter, October, 2015, rare PACs and PVCs. Heart rate range 70-128, mean heart rate 95 during the daytime, mean heart rate 85 during the resting hours.   //   Patient was diagnosed with severe sleep apnea. C Pap was started and his resting sinus tachycardia improved greatly.   . TRANSAMINASES, SERUM, ELEVATED 11/30/2005   Qualifier: Diagnosis of  By: [name], [name]    . Type 2 diabetes mellitus, uncontrolled ([place]) 04/14/2012  . URI (upper respiratory infection) 06/22/2015    Past Surgical History:  Procedure Laterality Date  . BIOPSY  10/19/2017   Procedure: BIOPSY;  Surgeon: Juanita Craver, MD;  [facility]: [facility];  Service: Endoscopy;;  . COLONOSCOPY WITH PROPOFOL N/A 10/19/2017   Procedure: COLONOSCOPY WITH PROPOFOL;  Surgeon: Juanita Craver, MD;  Location: [facility];  Service: Endoscopy;  Laterality: N/A;  . HERNIA REPAIR    . POLYPECTOMY  10/19/2017   Procedure: POLYPECTOMY;  Surgeon: Juanita Craver, MD;  Location: [facility];  Service: Endoscopy;;  . PROSTATE BIOPSY N/A   . rotator cuff surgery  2012   Dr. [name]    There were no vitals filed for this visit.  Subjective Assessment - 11/17/17 0933    Subjective  I am much better: 30% per pt report.  Currently in Pain?  No/denies    Multiple Pain Sites  No                       [facility] Adult PT Treatment/Exercise - 11/17/17 0001      Lumbar Exercises: Supine   Ab Set  5 reps;3 seconds   On foam roll   Bent Knee Raise  5 reps;3 seconds   on foam roll   Other Supine Lumbar Exercises  single arm raise 1x alt,  then dying bug 3x bil,    on foam roll            PT Education - 11/17/17 0950    Education Details  Melt Method repertoire for LE release/hydration, where to buy the book and what beginning protocol to try.    Person(s) Educated  Patient    Methods  Explanation;Demonstration;Verbal cues;Handout   Handout for where to buy book   Comprehension  Returned  demonstration;Verbalized understanding       PT Short Term Goals - 11/15/17 1224      PT SHORT TERM GOAL #2   Title  report 25% less lumbar pain with gardening tasks    Time  4    Period  Weeks    Status  Achieved      PT SHORT TERM GOAL #3   Title  demonnstrate and verbalize correct body mechanics for home and work tasks    Time  4    Period  Weeks    Status  On-going        PT Long Term Goals - 10/26/17 1602      PT LONG TERM GOAL #1   Title  be independent in advanced HEP    Time  6    Period  Weeks    Status  New    Target Date  12/07/17      PT LONG TERM GOAL #2   Title  reduce FOTO to < or = to 41% limitation    Time  6    Period  Weeks    Status  New    Target Date  12/07/17      PT LONG TERM GOAL #3   Title  perform gardening with minimal to moderate low back pain using correct body mechanics    Time  6    Period  Weeks    Status  New    Target Date  12/07/17      PT LONG TERM GOAL #4   Title  report a 50% reduction in the frequency and intensity of numbness and tingling with home and work tasks    Time  6    Period  Weeks    Status  New    Target Date  12/07/17      PT LONG TERM GOAL #5   Title  sit for 30 minutes without using ice due to intensity of lumbar pain decreased >/= 50%    Time  6    Period  Weeks    Status  New    Target Date  12/07/17            Plan - 11/17/17 0932    Clinical Impression Statement  Pt presents pain free today. He is interested in using his foam roll more at home for core strength &stabilization. pt was educated in ways to do this and a book to read that discusses the science behind The Melt Method. Pt  contracting his lower abdominals correctly more consistently during session. Some TC still required , but better.      Rehab Potential  Excellent    Clinical Impairments Affecting Rehab Potential  Prostate cancer 10/19/2017; Radioactive seeds scheduled for placement on 12/08/2017; Diabetes; Atrial fibrillation and  flutter 07/13/2017; MRI shows impringement on left L4 nerve and mild canal stenosis    PT Frequency  2x / week    PT Duration  6 weeks    PT Treatment/Interventions  Cryotherapy;Electrical Stimulation;Moist Heat;Traction;Therapeutic exercise;Therapeutic activities;Neuromuscular re-education;Patient/family education;Manual techniques;Passive range of motion;Dry needling;Taping    PT Next Visit Plan  Continue with core strength using foam roll, follow up with if pt was able to try some of the protocol.     PT Home Exercise Plan  Access Code: [email or web address]     Consulted and Agree with Plan of Care  Patient       Patient will benefit from skilled therapeutic intervention in order to improve the following deficits and impairments:  Increased fascial restricitons, Pain, Decreased mobility, Increased muscle spasms, Decreased activity tolerance, Decreased range of motion, Decreased strength  Visit Diagnosis: Acute bilateral low back pain with bilateral sciatica  Muscle weakness (generalized)     Problem List Patient Active Problem List   Diagnosis Date Noted  . Malignant neoplasm of prostate ([place]) 09/28/2017  . Atrial fibrillation and flutter ([place]) 07/13/2017  . Hypertension associated with diabetes ([place]) 04/01/2016  . Low testosterone 10/07/2015  . Infection of urinary tract 06/22/2015  . URI (upper respiratory infection) 06/22/2015  . Family history of premature CAD 03/20/2015  . Obstructive sleep apnea 12/06/2013  . Sinus tachycardia 10/27/2013  . Ejection fraction   . Plantar fasciitis of right foot 10/21/2012  . Type 2 diabetes mellitus, uncontrolled ([place]) 04/14/2012  . Anxiety 02/03/2011  . Overweight 10/23/2008  . Hereditary and idiopathic peripheral neuropathy 12/06/2006  . Insomnia 12/06/2006  . TRANSAMINASES, SERUM, ELEVATED 11/30/2005  . Hyperlipidemia 11/29/2005    Bralynn Velador, PTA 11/17/2017, 10:10 AM  [facility] [address], [place], [place], [address] Phone: [phone]   Fax:  [phone]  Name: Brandon Frank MRN: [id number] Date of Birth: Jan 27, 1955

## 2017-11-22 ENCOUNTER — Ambulatory Visit: Payer: BLUE CROSS/BLUE SHIELD | Attending: Physician Assistant | Admitting: Physical Therapy

## 2017-11-22 ENCOUNTER — Encounter: Payer: Self-pay | Admitting: Physical Therapy

## 2017-11-22 DIAGNOSIS — M6281 Muscle weakness (generalized): Secondary | ICD-10-CM | POA: Insufficient documentation

## 2017-11-22 DIAGNOSIS — M5442 Lumbago with sciatica, left side: Secondary | ICD-10-CM | POA: Diagnosis present

## 2017-11-22 DIAGNOSIS — M5441 Lumbago with sciatica, right side: Secondary | ICD-10-CM | POA: Insufficient documentation

## 2017-11-22 NOTE — Therapy (Signed)
[facility] [address] [place], [place], [address] Phone: [phone]   Fax:  [phone]  Physical Therapy Treatment  Patient Details  Name: Brandon Frank MRN: [id number] Date of Birth: Apr 21, 1955 Referring Provider (PT): Ronette Deter, [place]   Encounter Date: 11/22/2017  PT End of Session - 11/22/17 0931    Visit Number  9    Date for PT Re-Evaluation  12/07/17    Authorization Type  BCBS    Authorization - Visit Number  9    Authorization - Number of Visits  30    PT Start Time  0931    PT Stop Time  1012    PT Time Calculation (min)  41 min    Activity Tolerance  Patient tolerated treatment well    Behavior During Therapy  [facility] for tasks assessed/performed       Past Medical History:  Diagnosis Date  . Anxiety 02/03/2011  . Atrial fibrillation and flutter ([place]) 07/13/2017  . Chronic low back pain   . Family history of premature CAD 03/20/2015  . Hereditary and idiopathic peripheral neuropathy 12/06/2006   Qualifier: Diagnosis of  By: [name], [name]    . Hyperlipidemia   . Hypertension associated with diabetes ([place]) 04/01/2016  . Infection of urinary tract 06/22/2015  . Insomnia 12/06/2006   Qualifier: Diagnosis of  By: [name], [name]    . Low testosterone 10/07/2015  . Mobitz type 1 second degree atrioventricular block    a. Event monitor 2017 - 1 beat.  . Obstructive sleep apnea 12/06/2013   NPSG 11/14/13- Severe OSA, AHI 86/ hr with loud snore, desat ot 80%, titrated to CPAP 12, weight 215 lbs   . Overweight 10/23/2008   Qualifier: Diagnosis of  By: [name], MD, [name] [name]    . Paroxysmal atrial flutter ([place])   . Plantar fasciitis of right foot 10/21/2012  . Premature atrial contractions   . Prostate cancer ([place])   . PVC's (premature ventricular contractions)   . Sinus tachycardia 10/27/2013   Persistent mild sinus tachycardia, October, 2015   //   48 hour Holter, October, 2015, rare PACs and  PVCs. Heart rate range 70-128, mean heart rate 95 during the daytime, mean heart rate 85 during the resting hours.   //   Patient was diagnosed with severe sleep apnea. C Pap was started and his resting sinus tachycardia improved greatly.   . TRANSAMINASES, SERUM, ELEVATED 11/30/2005   Qualifier: Diagnosis of  By: [name], [name]    . Type 2 diabetes mellitus, uncontrolled ([place]) 04/14/2012  . URI (upper respiratory infection) 06/22/2015    Past Surgical History:  Procedure Laterality Date  . BIOPSY  10/19/2017   Procedure: BIOPSY;  Surgeon: Juanita Craver, MD;  Location: [facility];  Service: Endoscopy;;  . COLONOSCOPY WITH PROPOFOL N/A 10/19/2017   Procedure: COLONOSCOPY WITH PROPOFOL;  Surgeon: Juanita Craver, MD;  Location: [facility];  Service: Endoscopy;  Laterality: N/A;  . HERNIA REPAIR    . POLYPECTOMY  10/19/2017   Procedure: POLYPECTOMY;  Surgeon: Juanita Craver, MD;  Location: [facility];  Service: Endoscopy;;  . PROSTATE BIOPSY N/A   . rotator cuff surgery  2012   Dr. [name]    There were no vitals filed for this visit.  Subjective Assessment - 11/22/17 0932    Subjective  Using my foam roll at home a lot and it really feels good.     Currently in Pain?  Yes  Pain Score  3     Pain Location  Back    Pain Orientation  Lower    Pain Descriptors / Indicators  Aching    Aggravating Factors   sittin gto much     Pain Relieving Factors  ice    Multiple Pain Sites  No                       [facility] Adult PT Treatment/Exercise - 11/22/17 0001      Lumbar Exercises: Stretches   Piriformis Stretch  Right;Left;2 reps;30 seconds   supine     Lumbar Exercises: Supine   Other Supine Lumbar Exercises  LE release series with foam roll per pt request to review for home.      Lumbar Exercises: Quadruped   Single Arm Raise  Right;Left;3 seconds   used soft roll for TC to stay in neutral spine, VC for core   Straight Leg Raise  3 seconds   3 reps; foam roll for TC to  keep neutral spine            PT Education - 11/22/17 1009    Education Details  HEP: quadruped arm and leg lifting, RTLE single leg stance    Person(s) Educated  Patient    Methods  Explanation;Demonstration;Tactile cues;Verbal cues;Handout    Comprehension  Verbalized understanding;Returned demonstration       PT Short Term Goals - 11/15/17 1224      PT SHORT TERM GOAL #2   Title  report 25% less lumbar pain with gardening tasks    Time  4    Period  Weeks    Status  Achieved      PT SHORT TERM GOAL #3   Title  demonnstrate and verbalize correct body mechanics for home and work tasks    Time  4    Period  Weeks    Status  On-going        PT Long Term Goals - 10/26/17 1602      PT LONG TERM GOAL #1   Title  be independent in advanced HEP    Time  6    Period  Weeks    Status  New    Target Date  12/07/17      PT LONG TERM GOAL #2   Title  reduce FOTO to < or = to 41% limitation    Time  6    Period  Weeks    Status  New    Target Date  12/07/17      PT LONG TERM GOAL #3   Title  perform gardening with minimal to moderate low back pain using correct body mechanics    Time  6    Period  Weeks    Status  New    Target Date  12/07/17      PT LONG TERM GOAL #4   Title  report a 50% reduction in the frequency and intensity of numbness and tingling with home and work tasks    Time  6    Period  Weeks    Status  New    Target Date  12/07/17      PT LONG TERM GOAL #5   Title  sit for 30 minutes without using ice due to intensity of lumbar pain decreased >/= 50%    Time  6    Period  Weeks    Status  New    Target  Date  12/07/17            Plan - 11/22/17 0959    Clinical Impression Statement  Pt reports pain is staying low level in his low back with minimal to no exacerbations in his hips or even down his legs. Pt is compliant with his HEP but still needs VC/TC for his alignment with his quadruped exercises. The foam roll helped give him  feedback with this exercise that maybe his wife can help him out with at home. Rt hip demonstrates weakness and bil hip flexor tightness.     Rehab Potential  Excellent    Clinical Impairments Affecting Rehab Potential  Prostate cancer 10/19/2017; Radioactive seeds scheduled for placement on 12/08/2017; Diabetes; Atrial fibrillation and flutter 07/13/2017; MRI shows impringement on left L4 nerve and mild canal stenosis    PT Frequency  2x / week    PT Duration  6 weeks    PT Treatment/Interventions  Cryotherapy;Electrical Stimulation;Moist Heat;Traction;Therapeutic exercise;Therapeutic activities;Neuromuscular re-education;Patient/family education;Manual techniques;Passive range of motion;Dry needling;Taping    PT Next Visit Plan  Continue with core strength using foam roll, follow up with if pt was able to try some of the protocol. Work on his quadruped technique ( keeping neutral spine) , RT hip stabilization in standing.     PT Home Exercise Plan  Access Code: [email or web address]     Consulted and Agree with Plan of Care  Patient       Patient will benefit from skilled therapeutic intervention in order to improve the following deficits and impairments:  Increased fascial restricitons, Pain, Decreased mobility, Increased muscle spasms, Decreased activity tolerance, Decreased range of motion, Decreased strength  Visit Diagnosis: Acute bilateral low back pain with bilateral sciatica  Muscle weakness (generalized)     Problem List Patient Active Problem List   Diagnosis Date Noted  . Malignant neoplasm of prostate ([place]) 09/28/2017  . Atrial fibrillation and flutter ([place]) 07/13/2017  . Hypertension associated with diabetes ([place]) 04/01/2016  . Low testosterone 10/07/2015  . Infection of urinary tract 06/22/2015  . URI (upper respiratory infection) 06/22/2015  . Family history of premature CAD 03/20/2015  . Obstructive sleep apnea 12/06/2013  . Sinus tachycardia 10/27/2013  . Ejection fraction   .  Plantar fasciitis of right foot 10/21/2012  . Type 2 diabetes mellitus, uncontrolled ([place]) 04/14/2012  . Anxiety 02/03/2011  . Overweight 10/23/2008  . Hereditary and idiopathic peripheral neuropathy 12/06/2006  . Insomnia 12/06/2006  . TRANSAMINASES, SERUM, ELEVATED 11/30/2005  . Hyperlipidemia 11/29/2005    ,, PTA 11/22/2017, 10:24 AM  [facility] [address], [place], [place], [address] Phone: [phone]   Fax:  [phone]  Name: ENMANUEL ZUFALL MRN: [id number] Date of Birth: 17-Aug-1955

## 2017-11-23 ENCOUNTER — Other Ambulatory Visit: Payer: Self-pay | Admitting: Family Medicine

## 2017-11-24 DIAGNOSIS — K760 Fatty (change of) liver, not elsewhere classified: Secondary | ICD-10-CM

## 2017-11-24 HISTORY — DX: Fatty (change of) liver, not elsewhere classified: K76.0

## 2017-11-25 ENCOUNTER — Encounter: Payer: Self-pay | Admitting: Physical Therapy

## 2017-11-25 ENCOUNTER — Ambulatory Visit: Payer: BLUE CROSS/BLUE SHIELD | Admitting: Physical Therapy

## 2017-11-25 DIAGNOSIS — M5442 Lumbago with sciatica, left side: Secondary | ICD-10-CM | POA: Diagnosis not present

## 2017-11-25 DIAGNOSIS — M6281 Muscle weakness (generalized): Secondary | ICD-10-CM

## 2017-11-25 DIAGNOSIS — M5441 Lumbago with sciatica, right side: Secondary | ICD-10-CM

## 2017-11-25 NOTE — Therapy (Signed)
[facility] [address] [place], [place], [address] Phone: [phone]   Fax:  [phone]  Physical Therapy Treatment  Patient Details  Name: Brandon Frank MRN: [id number] Date of Birth: 1955/04/16 Referring Provider (PT): Ronette Deter, [place]   Encounter Date: 11/25/2017  PT End of Session - 11/25/17 1029    Visit Number  10    Date for PT Re-Evaluation  12/07/17    Authorization Type  BCBS    Authorization - Visit Number  10    Authorization - Number of Visits  30    PT Start Time  0930    PT Stop Time  1025    PT Time Calculation (min)  55 min    Activity Tolerance  Patient tolerated treatment well    Behavior During Therapy  [facility] for tasks assessed/performed       Past Medical History:  Diagnosis Date  . Anxiety 02/03/2011  . Atrial fibrillation and flutter ([place]) 07/13/2017  . Chronic low back pain   . Family history of premature CAD 03/20/2015  . Hereditary and idiopathic peripheral neuropathy 12/06/2006   Qualifier: Diagnosis of  By: [name], [name]    . Hyperlipidemia   . Hypertension associated with diabetes ([place]) 04/01/2016  . Infection of urinary tract 06/22/2015  . Insomnia 12/06/2006   Qualifier: Diagnosis of  By: [name], [name]    . Low testosterone 10/07/2015  . Mobitz type 1 second degree atrioventricular block    a. Event monitor 2017 - 1 beat.  . Obstructive sleep apnea 12/06/2013   NPSG 11/14/13- Severe OSA, AHI 86/ hr with loud snore, desat ot 80%, titrated to CPAP 12, weight 215 lbs   . Overweight 10/23/2008   Qualifier: Diagnosis of  By: [name], MD, [name] [name]    . Paroxysmal atrial flutter ([place])   . Plantar fasciitis of right foot 10/21/2012  . Premature atrial contractions   . Prostate cancer ([place])   . PVC's (premature ventricular contractions)   . Sinus tachycardia 10/27/2013   Persistent mild sinus tachycardia, October, 2015   //   48 hour Holter, October, 2015, rare PACs and  PVCs. Heart rate range 70-128, mean heart rate 95 during the daytime, mean heart rate 85 during the resting hours.   //   Patient was diagnosed with severe sleep apnea. C Pap was started and his resting sinus tachycardia improved greatly.   . TRANSAMINASES, SERUM, ELEVATED 11/30/2005   Qualifier: Diagnosis of  By: [name], [name]    . Type 2 diabetes mellitus, uncontrolled ([place]) 04/14/2012  . URI (upper respiratory infection) 06/22/2015    Past Surgical History:  Procedure Laterality Date  . BIOPSY  10/19/2017   Procedure: BIOPSY;  Surgeon: Juanita Craver, MD;  Location: [facility];  Service: Endoscopy;;  . COLONOSCOPY WITH PROPOFOL N/A 10/19/2017   Procedure: COLONOSCOPY WITH PROPOFOL;  Surgeon: Juanita Craver, MD;  Location: [facility];  Service: Endoscopy;  Laterality: N/A;  . HERNIA REPAIR    . POLYPECTOMY  10/19/2017   Procedure: POLYPECTOMY;  Surgeon: Juanita Craver, MD;  Location: [facility];  Service: Endoscopy;;  . PROSTATE BIOPSY N/A   . rotator cuff surgery  2012   Dr. [name]    There were no vitals filed for this visit.  Subjective Assessment - 11/25/17 0935    Subjective  My back is feeling good today. I am talking to a surgeon this week that is located friday. When I first came I  was a 8/10.     Patient Stated Goals  relieve pain in lumbar and avoid surgery; strengthen the core    Currently in Pain?  Yes    Pain Score  2     Pain Location  Back    Pain Orientation  Lower    Pain Descriptors / Indicators  Aching    Pain Type  Chronic pain    Pain Onset  More than a month ago    Aggravating Factors   sitting too much    Pain Relieving Factors  ice    Multiple Pain Sites  No         [facility] PT Assessment - 11/25/17 0001      Assessment   Medical Diagnosis  M51.36 Other intervertebral disc degenrative , lumbar region    Referring Provider (PT)  Ronette Deter, PA-C    Onset Date/Surgical Date  01/19/17    Prior Therapy  Yes for the back      Precautions   Precautions   Other (comment)    Precaution Comments  Current Prostate cancer      Strength   Right Hip Extension  4/5    Right Hip ABduction  4/5    Left Hip Extension  4/5    Left Hip ADduction  4-/5    Right Knee Flexion  4/5    Left Knee Flexion  4/5    Left Knee Extension  4/5                   [facility] Adult PT Treatment/Exercise - 11/25/17 0001      Exercises   Exercises  Lumbar      Lumbar Exercises: Stretches   Passive Hamstring Stretch  Right;Left;3 reps;30 seconds   with strap     Lumbar Exercises: Aerobic   Nustep   L2 x 10 min arms and legs      Lumbar Exercises: Supine   Bent Knee Raise  20 reps;1 second   just toe taping   Bridge with March  20 reps;1 second    Straight Leg Raise  20 reps;1 second   abdominal bracing     Lumbar Exercises: Quadruped   Opposite Arm/Leg Raise  15 reps;Right arm/Left leg;Left arm/Right leg;1 second      Modalities   Modalities  Electrical Stimulation      Electrical Stimulation   Electrical Stimulation Location  attached to dry needles located in lumbar multifidi    Electrical Stimulation Action  electrodes attached to the dry needles      Electrical Stimulation Parameters  to patient tolerance,     Electrical Stimulation Goals  Pain      Manual Therapy   Manual Therapy  Soft tissue mobilization    Soft tissue mobilization  bilateral lumbar paraspinals       Trigger Point Dry Needling - 11/25/17 1005    Consent Given?  Yes    Muscles Treated Upper Body  --   lumbar multifide L1-L5   Muscles Treated Lower Body  --   elongation of tissue, trigger point response            PT Short Term Goals - 11/25/17 1032      PT SHORT TERM GOAL #3   Title  demonnstrate and verbalize correct body mechanics for home and work tasks    Time  4    Period  Weeks    Status  Achieved  PT Long Term Goals - 11/25/17 1033      PT LONG TERM GOAL #1   Title  be independent in advanced HEP    Time  6    Period  Weeks     Status  On-going      PT LONG TERM GOAL #2   Title  reduce FOTO to < or = to 41% limitation    Time  6    Period  Weeks    Status  On-going      PT LONG TERM GOAL #3   Title  perform gardening with minimal to moderate low back pain using correct body mechanics    Time  6    Period  Weeks    Status  On-going      PT LONG TERM GOAL #4   Title  report a 50% reduction in the frequency and intensity of numbness and tingling with home and work tasks    Time  6    Period  Weeks    Status  On-going      PT LONG TERM GOAL #5   Title  sit for 30 minutes without using ice due to intensity of lumbar pain decreased >/= 50%    Time  6    Period  Weeks    Status  On-going            Plan - 11/25/17 1029    Clinical Impression Statement  Patient pain is more centralized in his back.  Patient continues to require verbal cues to contract the abdominals to minimize the central bulge in the abdomen.  Patient is able to hold the quadruped lift alternate extremity with increased stability.  Patient  will benefit from skilled therapy to improve core strength and manage his back pain.     Rehab Potential  Excellent    Clinical Impairments Affecting Rehab Potential  Prostate cancer 10/19/2017; Radioactive seeds scheduled for placement on 12/08/2017; Diabetes; Atrial fibrillation and flutter 07/13/2017; MRI shows impringement on left L4 nerve and mild canal stenosis    PT Frequency  2x / week    PT Duration  6 weeks    PT Treatment/Interventions  Cryotherapy;Electrical Stimulation;Moist Heat;Traction;Therapeutic exercise;Therapeutic activities;Neuromuscular re-education;Patient/family education;Manual techniques;Passive range of motion;Dry needling;Taping    PT Next Visit Plan  Continue with core strength using foam roll, follow up with if pt was able to try some of the protocol. Work on his quadruped technique ( keeping neutral spine) , RT hip stabilization in standing. End of next week patient will be  reassessed by PT    PT Home Exercise Plan  Access Code: [email or web address]     Consulted and Agree with Plan of Care  Patient       Patient will benefit from skilled therapeutic intervention in order to improve the following deficits and impairments:  Increased fascial restricitons, Pain, Decreased mobility, Increased muscle spasms, Decreased activity tolerance, Decreased range of motion, Decreased strength  Visit Diagnosis: Acute bilateral low back pain with bilateral sciatica  Muscle weakness (generalized)     Problem List Patient Active Problem List   Diagnosis Date Noted  . Malignant neoplasm of prostate ([place]) 09/28/2017  . Atrial fibrillation and flutter ([place]) 07/13/2017  . Hypertension associated with diabetes ([place]) 04/01/2016  . Low testosterone 10/07/2015  . Infection of urinary tract 06/22/2015  . URI (upper respiratory infection) 06/22/2015  . Family history of premature CAD 03/20/2015  . Obstructive sleep apnea 12/06/2013  . Sinus tachycardia 10/27/2013  .  Ejection fraction   . Plantar fasciitis of right foot 10/21/2012  . Type 2 diabetes mellitus, uncontrolled ([place]) 04/14/2012  . Anxiety 02/03/2011  . Overweight 10/23/2008  . Hereditary and idiopathic peripheral neuropathy 12/06/2006  . Insomnia 12/06/2006  . TRANSAMINASES, SERUM, ELEVATED 11/30/2005  . Hyperlipidemia 11/29/2005    Earlie Counts, PT 11/25/17 10:34 AM   [facility] [address] [place], [place], [address] Phone: [phone]   Fax:  [phone]  Name: Brandon Frank MRN: [id number] Date of Birth: 1955/11/09

## 2017-11-29 ENCOUNTER — Ambulatory Visit: Payer: BLUE CROSS/BLUE SHIELD | Admitting: Physical Therapy

## 2017-11-29 ENCOUNTER — Encounter: Payer: Self-pay | Admitting: Physical Therapy

## 2017-11-29 DIAGNOSIS — M6281 Muscle weakness (generalized): Secondary | ICD-10-CM

## 2017-11-29 DIAGNOSIS — M5442 Lumbago with sciatica, left side: Principal | ICD-10-CM

## 2017-11-29 DIAGNOSIS — M5441 Lumbago with sciatica, right side: Secondary | ICD-10-CM

## 2017-11-29 NOTE — Therapy (Signed)
[facility] [address], [place], [place], [address] Phone: [phone]   Fax:  [phone]  Physical Therapy Treatment  Patient Details  Name: Brandon Frank MRN: [id number] Date of Birth: 1955/09/09 Referring Provider (PT): Ronette Deter, [place]   Encounter Date: 11/29/2017  PT End of Session - 11/29/17 1059    Visit Number  11    Date for PT Re-Evaluation  12/07/17    Authorization Type  BCBS    Authorization - Visit Number  11    Authorization - Number of Visits  30    PT Start Time  1016    PT Stop Time  1110    PT Time Calculation (min)  54 min    Activity Tolerance  Patient tolerated treatment well    Behavior During Therapy  [facility] for tasks assessed/performed       Past Medical History:  Diagnosis Date  . Anxiety 02/03/2011  . Atrial fibrillation and flutter ([place]) 07/13/2017  . Chronic low back pain   . Family history of premature CAD 03/20/2015  . Hereditary and idiopathic peripheral neuropathy 12/06/2006   Qualifier: Diagnosis of  By: [name], [name]    . Hyperlipidemia   . Hypertension associated with diabetes ([place]) 04/01/2016  . Infection of urinary tract 06/22/2015  . Insomnia 12/06/2006   Qualifier: Diagnosis of  By: [name], [name]    . Low testosterone 10/07/2015  . Mobitz type 1 second degree atrioventricular block    a. Event monitor 2017 - 1 beat.  . Obstructive sleep apnea 12/06/2013   NPSG 11/14/13- Severe OSA, AHI 86/ hr with loud snore, desat ot 80%, titrated to CPAP 12, weight 215 lbs   . Overweight 10/23/2008   Qualifier: Diagnosis of  By: [name], MD, [name] [name]    . Paroxysmal atrial flutter ([place])   . Plantar fasciitis of right foot 10/21/2012  . Premature atrial contractions   . Prostate cancer ([place])   . PVC's (premature ventricular contractions)   . Sinus tachycardia 10/27/2013   Persistent mild sinus tachycardia, October, 2015   //   48 hour Holter, October, 2015, rare PACs and  PVCs. Heart rate range 70-128, mean heart rate 95 during the daytime, mean heart rate 85 during the resting hours.   //   Patient was diagnosed with severe sleep apnea. C Pap was started and his resting sinus tachycardia improved greatly.   . TRANSAMINASES, SERUM, ELEVATED 11/30/2005   Qualifier: Diagnosis of  By: [name], [name]    . Type 2 diabetes mellitus, uncontrolled ([place]) 04/14/2012  . URI (upper respiratory infection) 06/22/2015    Past Surgical History:  Procedure Laterality Date  . BIOPSY  10/19/2017   Procedure: BIOPSY;  Surgeon: Juanita Craver, MD;  Location: [facility];  Service: Endoscopy;;  . COLONOSCOPY WITH PROPOFOL N/A 10/19/2017   Procedure: COLONOSCOPY WITH PROPOFOL;  Surgeon: Juanita Craver, MD;  Location: [facility];  Service: Endoscopy;  Laterality: N/A;  . HERNIA REPAIR    . POLYPECTOMY  10/19/2017   Procedure: POLYPECTOMY;  Surgeon: Juanita Craver, MD;  Location: [facility];  Service: Endoscopy;;  . PROSTATE BIOPSY N/A   . rotator cuff surgery  2012   Dr. [name]    There were no vitals filed for this visit.  Subjective Assessment - 11/29/17 1026    Subjective  I spoke with MD in [place] about back surgery. Per pt, this MD/PA reported they could do a laminectomy and then a second  surgery to stablize the spine and fuse it. I forgot to take Cymbalta this AM and I am in pain more than I typically AM.     Currently in Pain?  Yes    Pain Score  5     Pain Location  Back   and coccyz   Pain Orientation  Lower    Pain Descriptors / Indicators  --   Not radiating                      [facility] Adult PT Treatment/Exercise - 11/29/17 0001      Lumbar Exercises: Aerobic   Nustep   L2 x 10 min arms and legs      Lumbar Exercises: Supine   Other Supine Lumbar Exercises  Supine on roll: core stabilization series/exercises on foam roll     Gave pt red band to take home for postural ex on roll     Cryotherapy   Number Minutes Cryotherapy  10 Minutes   post  session   Cryotherapy Location  Lumbar Spine    Type of Cryotherapy  Ice pack               PT Short Term Goals - 11/25/17 1032      PT SHORT TERM GOAL #3   Title  demonnstrate and verbalize correct body mechanics for home and work tasks    Time  4    Period  Weeks    Status  Achieved        PT Long Term Goals - 11/25/17 1033      PT LONG TERM GOAL #1   Title  be independent in advanced HEP    Time  6    Period  Weeks    Status  On-going      PT LONG TERM GOAL #2   Title  reduce FOTO to < or = to 41% limitation    Time  6    Period  Weeks    Status  On-going      PT LONG TERM GOAL #3   Title  perform gardening with minimal to moderate low back pain using correct body mechanics    Time  6    Period  Weeks    Status  On-going      PT LONG TERM GOAL #4   Title  report a 50% reduction in the frequency and intensity of numbness and tingling with home and work tasks    Time  6    Period  Weeks    Status  On-going      PT LONG TERM GOAL #5   Title  sit for 30 minutes without using ice due to intensity of lumbar pain decreased >/= 50%    Time  6    Period  Weeks    Status  On-going            Plan - 11/29/17 1045    Clinical Impression Statement  Pt having more pain today for his PT session. He reports by forgetting his medicaine this morning that might be the reason for the increased pain. Pt wanted/needed to review the core stabilization exercises done on the foam roll. He was able to perform all th eexercises he was assigned for HEP and in fact felt better after doing them. He was reminded to use his exercises as medicine on a daily basis.     Rehab Potential  Excellent    Clinical  Impairments Affecting Rehab Potential  Prostate cancer 10/19/2017; Radioactive seeds scheduled for placement on 12/08/2017; Diabetes; Atrial fibrillation and flutter 07/13/2017; MRI shows impringement on left L4 nerve and mild canal stenosis    PT Frequency  2x / week    PT  Duration  6 weeks    PT Treatment/Interventions  Cryotherapy;Electrical Stimulation;Moist Heat;Traction;Therapeutic exercise;Therapeutic activities;Neuromuscular re-education;Patient/family education;Manual techniques;Passive range of motion;Dry needling;Taping    PT Next Visit Plan  Possible DC. Pt having prostate sx next week. Goals and FOTO next.    PT Home Exercise Plan  Access Code: [email or web address]     Consulted and Agree with Plan of Care  Patient       Patient will benefit from skilled therapeutic intervention in order to improve the following deficits and impairments:  Increased fascial restricitons, Pain, Decreased mobility, Increased muscle spasms, Decreased activity tolerance, Decreased range of motion, Decreased strength  Visit Diagnosis: Acute bilateral low back pain with bilateral sciatica  Muscle weakness (generalized)     Problem List Patient Active Problem List   Diagnosis Date Noted  . Malignant neoplasm of prostate ([place]) 09/28/2017  . Atrial fibrillation and flutter ([place]) 07/13/2017  . Hypertension associated with diabetes ([place]) 04/01/2016  . Low testosterone 10/07/2015  . Infection of urinary tract 06/22/2015  . URI (upper respiratory infection) 06/22/2015  . Family history of premature CAD 03/20/2015  . Obstructive sleep apnea 12/06/2013  . Sinus tachycardia 10/27/2013  . Ejection fraction   . Plantar fasciitis of right foot 10/21/2012  . Type 2 diabetes mellitus, uncontrolled ([place]) 04/14/2012  . Anxiety 02/03/2011  . Overweight 10/23/2008  . Hereditary and idiopathic peripheral neuropathy 12/06/2006  . Insomnia 12/06/2006  . TRANSAMINASES, SERUM, ELEVATED 11/30/2005  . Hyperlipidemia 11/29/2005    Aleighna Wojtas, PTA 11/29/2017, 12:46 PM  [facility] [address] [place], [place], [address] Phone: [phone]   Fax:  [phone]  Name: Brandon Frank MRN: [id number] Date of Birth:  [birth year]

## 2017-11-30 ENCOUNTER — Telehealth: Payer: Self-pay | Admitting: *Deleted

## 2017-11-30 NOTE — Progress Notes (Signed)
PATIENT STATED DR [facility] WANTS PSA AND TESTERONE WITH PRE OP LABS DR BELL IS SURGEON , PATIENT GIVEN PAM GIBSONM NUMBER TO CALL AND GET DR MANNY TO PUT ODRERS IN Epic FOR PSA AND TESTERONE.

## 2017-11-30 NOTE — Telephone Encounter (Signed)
CALLED PATIENT TO REMIND OF LAB APPT. FOR 12-01-17 - ARRIVAL TIME - 8:45 AM @ [facility], SPOKE WITH PATIENT AND HE IS AWARE OF THIS APPT.

## 2017-12-01 ENCOUNTER — Encounter (HOSPITAL_COMMUNITY)
Admission: RE | Admit: 2017-12-01 | Discharge: 2017-12-01 | Disposition: A | Discharge status: Home / Self Care | Payer: BLUE CROSS/BLUE SHIELD | Source: Ambulatory Visit | Attending: Urology | Admitting: Urology

## 2017-12-01 DIAGNOSIS — Z01812 Encounter for preprocedural laboratory examination: Secondary | ICD-10-CM | POA: Insufficient documentation

## 2017-12-01 LAB — CBC
HEMATOCRIT: 51.3 % (ref 39.0–52.0)
HEMOGLOBIN: 17.1 g/dL — AB (ref 13.0–17.0)
MCH: 32.1 pg (ref 26.0–34.0)
MCHC: 33.3 g/dL (ref 30.0–36.0)
MCV: 96.2 fL (ref 80.0–100.0)
Platelets: 172 10*3/uL (ref 150–400)
RBC: 5.33 MIL/uL (ref 4.22–5.81)
RDW: 12.6 % (ref 11.5–15.5)
WBC: 5.7 10*3/uL (ref 4.0–10.5)
nRBC: 0 % (ref 0.0–0.2)

## 2017-12-01 LAB — COMPREHENSIVE METABOLIC PANEL
ALT: 30 U/L (ref 0–44)
ANION GAP: 13 (ref 5–15)
AST: 29 U/L (ref 15–41)
Albumin: 4.6 g/dL (ref 3.5–5.0)
Alkaline Phosphatase: 53 U/L (ref 38–126)
BILIRUBIN TOTAL: 0.8 mg/dL (ref 0.3–1.2)
BUN: 14 mg/dL (ref 6–20)
CHLORIDE: 100 mmol/L (ref 98–111)
CO2: 23 mmol/L (ref 22–32)
Calcium: 9.3 mg/dL (ref 8.9–10.3)
Creatinine, Ser: 0.94 mg/dL (ref 0.61–1.24)
Glucose, Bld: 173 mg/dL — ABNORMAL HIGH (ref 70–99)
Potassium: 4.3 mmol/L (ref 3.5–5.1)
Sodium: 136 mmol/L (ref 135–145)
TOTAL PROTEIN: 6.9 g/dL (ref 6.5–8.1)

## 2017-12-01 LAB — PROTIME-INR
INR: 1.11
Prothrombin Time: 14.2 seconds (ref 11.4–15.2)

## 2017-12-01 LAB — APTT: APTT: 31 s (ref 24–36)

## 2017-12-01 LAB — PSA: PROSTATIC SPECIFIC ANTIGEN: 4.53 ng/mL — AB (ref 0.00–4.00)

## 2017-12-02 ENCOUNTER — Other Ambulatory Visit: Payer: Self-pay

## 2017-12-02 ENCOUNTER — Encounter: Payer: Self-pay | Admitting: Physical Therapy

## 2017-12-02 ENCOUNTER — Encounter (HOSPITAL_BASED_OUTPATIENT_CLINIC_OR_DEPARTMENT_OTHER): Payer: Self-pay

## 2017-12-02 ENCOUNTER — Ambulatory Visit: Payer: BLUE CROSS/BLUE SHIELD | Admitting: Physical Therapy

## 2017-12-02 DIAGNOSIS — M5441 Lumbago with sciatica, right side: Secondary | ICD-10-CM

## 2017-12-02 DIAGNOSIS — M5442 Lumbago with sciatica, left side: Secondary | ICD-10-CM | POA: Diagnosis not present

## 2017-12-02 DIAGNOSIS — M6281 Muscle weakness (generalized): Secondary | ICD-10-CM

## 2017-12-02 LAB — TESTOSTERONE: Testosterone: 646 ng/dL (ref 264–916)

## 2017-12-02 NOTE — Therapy (Signed)
[facility] [address] [place], [place], [address] Phone: [phone]   Fax:  [phone]  Physical Therapy Treatment  Patient Details  Name: Brandon Frank MRN: [id number] Date of Birth: 27-May-1955 Referring Provider (PT): Ronette Deter, [place]   Encounter Date: 12/02/2017  PT End of Session - 12/02/17 1006    Visit Number  12    Date for PT Re-Evaluation  12/07/17    Authorization Type  BCBS    Authorization - Visit Number  12    PT Start Time  0930    PT Stop Time  1015    PT Time Calculation (min)  45 min    Activity Tolerance  Patient tolerated treatment well    Behavior During Therapy  [facility] for tasks assessed/performed       Past Medical History:  Diagnosis Date  . Anxiety 02/03/2011  . Atrial fibrillation and flutter ([place]) 07/13/2017  . Chronic low back pain   . Family history of premature CAD 03/20/2015  . Hereditary and idiopathic peripheral neuropathy 12/06/2006   Qualifier: Diagnosis of  By: [name], [name]    . Hyperlipidemia   . Hypertension associated with diabetes ([place]) 04/01/2016  . Infection of urinary tract 06/22/2015  . Insomnia 12/06/2006   Qualifier: Diagnosis of  By: [name], [name]    . Low testosterone 10/07/2015  . Mobitz type 1 second degree atrioventricular block    a. Event monitor 2017 - 1 beat.  . Obstructive sleep apnea 12/06/2013   NPSG 11/14/13- Severe OSA, AHI 86/ hr with loud snore, desat ot 80%, titrated to CPAP 12, weight 215 lbs   . Overweight 10/23/2008   Qualifier: Diagnosis of  By: [name], MD, [name] [name]    . Paroxysmal atrial flutter ([place])   . Plantar fasciitis of right foot 10/21/2012  . Premature atrial contractions   . Prostate cancer ([place])   . PVC's (premature ventricular contractions)   . Sinus tachycardia 10/27/2013   Persistent mild sinus tachycardia, October, 2015   //   48 hour Holter, October, 2015, rare PACs and PVCs. Heart rate range 70-128, mean heart  rate 95 during the daytime, mean heart rate 85 during the resting hours.   //   Patient was diagnosed with severe sleep apnea. C Pap was started and his resting sinus tachycardia improved greatly.   . TRANSAMINASES, SERUM, ELEVATED 11/30/2005   Qualifier: Diagnosis of  By: [name], [name]    . Type 2 diabetes mellitus, uncontrolled ([place]) 04/14/2012  . URI (upper respiratory infection) 06/22/2015    Past Surgical History:  Procedure Laterality Date  . BIOPSY  10/19/2017   Procedure: BIOPSY;  Surgeon: Juanita Craver, MD;  [facility];  Service: Endoscopy;;  . COLONOSCOPY WITH PROPOFOL N/A 10/19/2017   Procedure: COLONOSCOPY WITH PROPOFOL;  Surgeon: Juanita Craver, MD;  [facility];  Service: Endoscopy;  Laterality: N/A;  . HERNIA REPAIR    . POLYPECTOMY  10/19/2017   Procedure: POLYPECTOMY;  Surgeon: Juanita Craver, MD;  [facility];  Service: Endoscopy;;  . PROSTATE BIOPSY N/A   . rotator cuff surgery  2012   Dr. [name]    There were no vitals filed for this visit.  Subjective Assessment - 12/02/17 0938    Subjective  I am having my procedure  next week.  My pain is more where the thoracic lumbar area.     Patient Stated Goals  relieve pain in lumbar and avoid surgery;  strengthen the core    Currently in Pain?  Yes    Pain Score  4     Pain Location  Back   hips   Pain Orientation  Lower    Pain Descriptors / Indicators  Aching    Pain Type  Chronic pain    Pain Onset  More than a month ago    Pain Frequency  Constant    Aggravating Factors   siting too much    Pain Relieving Factors  ice    Multiple Pain Sites  No         [facility] PT Assessment - 12/02/17 0001      Assessment   Medical Diagnosis  M51.36 Other intervertebral disc degenrative , lumbar region    Referring Provider (PT)  Ronette Deter, PA-C    Onset Date/Surgical Date  01/19/17    Prior Therapy  Yes for the back      Precautions   Precautions  Other (comment)    Precaution Comments   Current Prostate cancer      Restrictions   Weight Bearing Restrictions  No      [place] residence      Prior Function   Level of Independence  Independent    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    Leisure  travel      Cognition   Overall Cognitive Status  Within Functional Limits for tasks assessed      Observation/Other Assessments   Focus on Therapeutic Outcomes (FOTO)   53% limitation      Strength   Overall Strength Comments  abdominal strength is 3/5    Right Hip Extension  4/5    Right Hip ABduction  4/5    Left Hip Extension  4/5    Left Hip ADduction  4-/5    Right Knee Flexion  4/5    Left Knee Flexion  4/5    Left Knee Extension  4/5                   [facility] Adult PT Treatment/Exercise - 12/02/17 0001      Lumbar Exercises: Aerobic   Nustep   L2 x 10 min arms and legs   while assessing patient     Modalities   Modalities  Electrical Stimulation      Electrical Stimulation   Electrical Stimulation Location  attached to dry needles located in lumbar multifidi    Electrical Stimulation Action  electrodes attached to dry needling    Electrical Stimulation Parameters  to patient tolerance    Electrical Stimulation Goals  Pain      Manual Therapy   Manual Therapy  Soft tissue mobilization    Soft tissue mobilization  bilateral lumbar paraspinals       Trigger Point Dry Needling - 12/02/17 1009    Consent Given?  Yes    Muscles Treated Upper Body  --   T12-L5 bil. multifidi   Muscles Treated Lower Body  --   elongation of tissue; trigger point response            PT Short Term Goals - 11/25/17 1032      PT SHORT TERM GOAL #3   Title  demonnstrate and verbalize correct body mechanics for home and work tasks    Time  4    Period  Weeks    Status  Achieved        PT Long Term Goals - 12/02/17 0950      PT LONG TERM GOAL #1   Title  be independent  in advanced HEP    Time  6    Period  Weeks    Status  Achieved      PT LONG TERM GOAL #2   Title  reduce FOTO to < or = to 41% limitation    Baseline  53% limitation    Time  6    Period  Weeks    Status  Not Met      PT LONG TERM GOAL #3   Title  perform gardening with minimal to moderate low back pain using correct body mechanics    Time  6    Period  Weeks    Status  Not Met      PT LONG TERM GOAL #4   Title  report a 50% reduction in the frequency and intensity of numbness and tingling with home and work tasks    Time  6    Period  Weeks    Status  Achieved      PT LONG TERM GOAL #5   Title  sit for 30 minutes without using ice due to intensity of lumbar pain decreased >/= 50%    Baseline  30% decreased    Time  6    Period  Weeks    Status  Not Met            Plan - 12/02/17 1150    Clinical Impression Statement  Patient reports his pain is 30% better. Patient has increase tissue mobility of the lumbar paraspinals. Patient reports he does not have the pain by L4-L5 but more by T12. Pain with sitting for 30 minutes without ice has decreased by 30%. Patient reports reduction of numbness and frequency of pain by 50%. Patient FOTO score went to 53% compared to initial on 51% limitation. Patient will be having radioactive seeds placed in next week for prostate cancer.      Rehab Potential  Excellent    Clinical Impairments Affecting Rehab Potential  Prostate cancer 10/19/2017; Radioactive seeds scheduled for placement on 12/08/2017; Diabetes; Atrial fibrillation and flutter 07/13/2017; MRI shows impringement on left L4 nerve and mild canal stenosis    PT Treatment/Interventions  Cryotherapy;Electrical Stimulation;Moist Heat;Traction;Therapeutic exercise;Therapeutic activities;Neuromuscular re-education;Patient/family education;Manual techniques;Passive range of motion;Dry needling;Taping    PT Next Visit Plan  Discharge to HEP due to having radioactive seeds placed into  prostate for prostate cancer next week    PT Home Exercise Plan  Access Code: [email or web address]     Consulted and Agree with Plan of Care  Patient       Patient will benefit from skilled therapeutic intervention in order to improve the following deficits and impairments:  Increased fascial restricitons, Pain, Decreased mobility, Increased muscle spasms, Decreased activity tolerance, Decreased range of motion, Decreased strength  Visit Diagnosis: Acute bilateral low back pain with bilateral sciatica  Muscle weakness (generalized)     Problem List Patient Active Problem List   Diagnosis Date Noted  . Malignant neoplasm of prostate ([place]) 09/28/2017  . Atrial fibrillation and flutter ([place]) 07/13/2017  . Hypertension associated with diabetes ([place]) 04/01/2016  . Low testosterone 10/07/2015  . Infection of urinary tract 06/22/2015  . URI (upper respiratory infection) 06/22/2015  . Family history of premature CAD 03/20/2015  . Obstructive sleep apnea 12/06/2013  . Sinus tachycardia 10/27/2013  .  Ejection fraction   . Plantar fasciitis of right foot 10/21/2012  . Type 2 diabetes mellitus, uncontrolled ([place]) 04/14/2012  . Anxiety 02/03/2011  . Overweight 10/23/2008  . Hereditary and idiopathic peripheral neuropathy 12/06/2006  . Insomnia 12/06/2006  . TRANSAMINASES, SERUM, ELEVATED 11/30/2005  . Hyperlipidemia 11/29/2005    Earlie Counts, PT 12/02/17 12:00 PM   [facility] [address] [place], [place], [address] Phone: [phone]   Fax:  [phone]  Name: Brandon Frank MRN: [id number] Date of Birth: [birth year]  PHYSICAL THERAPY DISCHARGE SUMMARY  Visits from Start of Care: 13  Current functional level related to goals / functional outcomes: See above.    Remaining deficits: See above. Patient is being discharged due to having radioactive seeds placed in the prostate for prostate cancer.    Education /  Equipment: HEP Plan: Patient agrees to discharge.  Patient goals were partially met. Patient is being discharged due to a change in medical status.  Thank you for the referral. ?????

## 2017-12-02 NOTE — Progress Notes (Signed)
Spoke with: Brandon Frank NPO:  No food after midnight/Clear liquids until 7:00AM DOS Arrival time: 11:00AM Labs: (EKG 09/27/2017,CXR7/08/2017, Labs 12/01/2017 chart/epic) AM medications: Fleet enema, Atorvastatin, Cymbalta, Zetia, Gabapentin, Metoprolol, Flonase if needed Pre op orders: Yes Ride home:  Brandon Frank (wife) [phone] Asked to bring CPAP machine

## 2017-12-02 NOTE — Pre-Procedure Instructions (Signed)
CBC, CMP, PSA results 12/01/2017 sent to Dr. Gloriann Loan via epic.

## 2017-12-07 ENCOUNTER — Other Ambulatory Visit: Payer: Self-pay | Admitting: Urology

## 2017-12-07 ENCOUNTER — Telehealth: Payer: Self-pay | Admitting: *Deleted

## 2017-12-07 DIAGNOSIS — C61 Malignant neoplasm of prostate: Secondary | ICD-10-CM

## 2017-12-07 NOTE — Telephone Encounter (Signed)
CALLED PATIENT TO REMIND OF IMPLANT FOR 12-08-17, SPOKE WITH PATIENT AND HE IS AWARE OF THIS PROCEDURE.

## 2017-12-08 ENCOUNTER — Encounter (HOSPITAL_BASED_OUTPATIENT_CLINIC_OR_DEPARTMENT_OTHER)
Admission: RE | Disposition: A | Discharge status: Home / Self Care | Payer: Self-pay | Source: Ambulatory Visit | Attending: Urology

## 2017-12-08 ENCOUNTER — Ambulatory Visit (HOSPITAL_BASED_OUTPATIENT_CLINIC_OR_DEPARTMENT_OTHER)
Admission: RE | Admit: 2017-12-08 | Discharge: 2017-12-08 | Disposition: A | Discharge status: Home / Self Care | Payer: BLUE CROSS/BLUE SHIELD | Source: Ambulatory Visit | Attending: Urology | Admitting: Urology

## 2017-12-08 ENCOUNTER — Other Ambulatory Visit: Payer: Self-pay | Admitting: Pharmacist

## 2017-12-08 ENCOUNTER — Ambulatory Visit (HOSPITAL_COMMUNITY): Payer: BLUE CROSS/BLUE SHIELD | Admitting: Anesthesiology

## 2017-12-08 ENCOUNTER — Encounter (HOSPITAL_BASED_OUTPATIENT_CLINIC_OR_DEPARTMENT_OTHER): Payer: Self-pay

## 2017-12-08 ENCOUNTER — Other Ambulatory Visit: Payer: Self-pay

## 2017-12-08 DIAGNOSIS — I4891 Unspecified atrial fibrillation: Secondary | ICD-10-CM | POA: Diagnosis not present

## 2017-12-08 DIAGNOSIS — E7800 Pure hypercholesterolemia, unspecified: Secondary | ICD-10-CM | POA: Insufficient documentation

## 2017-12-08 DIAGNOSIS — C61 Malignant neoplasm of prostate: Secondary | ICD-10-CM | POA: Diagnosis not present

## 2017-12-08 DIAGNOSIS — Z8379 Family history of other diseases of the digestive system: Secondary | ICD-10-CM | POA: Diagnosis not present

## 2017-12-08 DIAGNOSIS — E291 Testicular hypofunction: Secondary | ICD-10-CM | POA: Insufficient documentation

## 2017-12-08 DIAGNOSIS — Z8249 Family history of ischemic heart disease and other diseases of the circulatory system: Secondary | ICD-10-CM | POA: Insufficient documentation

## 2017-12-08 DIAGNOSIS — Z5309 Procedure and treatment not carried out because of other contraindication: Secondary | ICD-10-CM | POA: Insufficient documentation

## 2017-12-08 DIAGNOSIS — Z79899 Other long term (current) drug therapy: Secondary | ICD-10-CM | POA: Diagnosis not present

## 2017-12-08 DIAGNOSIS — E119 Type 2 diabetes mellitus without complications: Secondary | ICD-10-CM | POA: Insufficient documentation

## 2017-12-08 DIAGNOSIS — Z87891 Personal history of nicotine dependence: Secondary | ICD-10-CM | POA: Diagnosis not present

## 2017-12-08 DIAGNOSIS — Z7901 Long term (current) use of anticoagulants: Secondary | ICD-10-CM | POA: Diagnosis not present

## 2017-12-08 HISTORY — DX: Diarrhea, unspecified: R19.7

## 2017-12-08 HISTORY — DX: Personal history of other diseases of the digestive system: Z87.19

## 2017-12-08 HISTORY — DX: Fracture of coccyx, initial encounter for closed fracture: S32.2XXA

## 2017-12-08 HISTORY — DX: Other intervertebral disc degeneration, lumbar region without mention of lumbar back pain or lower extremity pain: M51.369

## 2017-12-08 HISTORY — DX: Radiculopathy, lumbar region: M54.16

## 2017-12-08 HISTORY — DX: Unspecified osteoarthritis, unspecified site: M19.90

## 2017-12-08 HISTORY — DX: Other intervertebral disc degeneration, lumbar region: M51.36

## 2017-12-08 HISTORY — DX: Other intervertebral disc displacement, lumbar region: M51.26

## 2017-12-08 HISTORY — DX: Spinal stenosis, lumbar region without neurogenic claudication: M48.061

## 2017-12-08 HISTORY — DX: Diverticulosis of large intestine without perforation or abscess without bleeding: K57.30

## 2017-12-08 HISTORY — DX: Unspecified cataract: H26.9

## 2017-12-08 HISTORY — DX: Fatty (change of) liver, not elsewhere classified: K76.0

## 2017-12-08 HISTORY — DX: Personal history of colon polyps, unspecified: Z86.0100

## 2017-12-08 HISTORY — DX: Personal history of colonic polyps: Z86.010

## 2017-12-08 LAB — GLUCOSE, CAPILLARY: Glucose-Capillary: 118 mg/dL — ABNORMAL HIGH (ref 70–99)

## 2017-12-08 SURGERY — INSERTION, RADIATION SOURCE, PROSTATE
Anesthesia: General

## 2017-12-08 MED ORDER — FLEET ENEMA 7-19 GM/118ML RE ENEM
1.0000 | ENEMA | Freq: Once | RECTAL | Status: DC
  Filled 2017-12-08: qty 1

## 2017-12-08 MED ORDER — CIPROFLOXACIN IN D5W 400 MG/200ML IV SOLN
400.0000 mg | INTRAVENOUS | Status: AC
  Administered 2017-12-08: 400 mg via INTRAVENOUS
  Filled 2017-12-08: qty 200

## 2017-12-08 MED ORDER — MIDAZOLAM HCL 2 MG/2ML IJ SOLN
INTRAMUSCULAR | Status: AC
  Filled 2017-12-08: qty 2

## 2017-12-08 MED ORDER — LIDOCAINE 2% (20 MG/ML) 5 ML SYRINGE
INTRAMUSCULAR | Status: AC
  Filled 2017-12-08: qty 5

## 2017-12-08 MED ORDER — CIPROFLOXACIN IN D5W 400 MG/200ML IV SOLN
INTRAVENOUS | Status: AC
  Filled 2017-12-08: qty 200

## 2017-12-08 MED ORDER — KETOROLAC TROMETHAMINE 30 MG/ML IJ SOLN
INTRAMUSCULAR | Status: AC
  Filled 2017-12-08: qty 1

## 2017-12-08 MED ORDER — FENTANYL CITRATE (PF) 100 MCG/2ML IJ SOLN
INTRAMUSCULAR | Status: AC
  Filled 2017-12-08: qty 2

## 2017-12-08 MED ORDER — ONDANSETRON HCL 4 MG/2ML IJ SOLN
INTRAMUSCULAR | Status: AC
  Filled 2017-12-08: qty 2

## 2017-12-08 MED ORDER — DEXAMETHASONE SODIUM PHOSPHATE 10 MG/ML IJ SOLN
INTRAMUSCULAR | Status: AC
  Filled 2017-12-08: qty 1

## 2017-12-08 MED ORDER — ALIROCUMAB 150 MG/ML ~~LOC~~ SOAJ: 150.0000 mg | SUBCUTANEOUS | 11 refills | Status: DC

## 2017-12-08 MED ORDER — LACTATED RINGERS IV SOLN
INTRAVENOUS | Status: DC
  Administered 2017-12-08: 12:00:00 via INTRAVENOUS
  Filled 2017-12-08: qty 1000

## 2017-12-08 MED ORDER — PROPOFOL 10 MG/ML IV BOLUS
INTRAVENOUS | Status: AC
  Filled 2017-12-08: qty 20

## 2017-12-08 SURGICAL SUPPLY — 40 items
BAG URINE DRAINAGE (UROLOGICAL SUPPLIES) ×1 IMPLANT
BLADE CLIPPER SURG (BLADE) ×1 IMPLANT
CATH FOLEY 2WAY SLVR  5CC 16FR (CATHETERS)
CATH FOLEY 2WAY SLVR 5CC 16FR (CATHETERS) ×1 IMPLANT
CATH ROBINSON RED A/P 16FR (CATHETERS) IMPLANT
CATH ROBINSON RED A/P 20FR (CATHETERS) ×1 IMPLANT
CLOTH BEACON ORANGE TIMEOUT ST (SAFETY) ×1 IMPLANT
CONT SPECI 4OZ STER CLIK (MISCELLANEOUS) ×2 IMPLANT
COVER BACK TABLE 60X90IN (DRAPES) ×1 IMPLANT
COVER MAYO STAND STRL (DRAPES) ×1 IMPLANT
COVER WAND RF STERILE (DRAPES) ×1 IMPLANT
DRSG TEGADERM 4X4.75 (GAUZE/BANDAGES/DRESSINGS) ×1 IMPLANT
DRSG TEGADERM 8X12 (GAUZE/BANDAGES/DRESSINGS) ×1 IMPLANT
GAUZE SPONGE 4X4 12PLY STRL (GAUZE/BANDAGES/DRESSINGS) IMPLANT
GLOVE BIO SURGEON STRL SZ 6 (GLOVE) IMPLANT
GLOVE BIO SURGEON STRL SZ 6.5 (GLOVE) IMPLANT
GLOVE BIO SURGEON STRL SZ7 (GLOVE) IMPLANT
GLOVE BIO SURGEON STRL SZ7.5 (GLOVE) ×1 IMPLANT
GLOVE BIO SURGEON STRL SZ8 (GLOVE) IMPLANT
GLOVE BIOGEL PI IND STRL 6 (GLOVE) IMPLANT
GLOVE BIOGEL PI IND STRL 6.5 (GLOVE) IMPLANT
GLOVE BIOGEL PI IND STRL 7.0 (GLOVE) IMPLANT
GLOVE BIOGEL PI IND STRL 7.5 (GLOVE) IMPLANT
GLOVE BIOGEL PI IND STRL 8 (GLOVE) IMPLANT
GLOVE BIOGEL PI INDICATOR 6 (GLOVE)
GLOVE BIOGEL PI INDICATOR 6.5 (GLOVE)
GLOVE BIOGEL PI INDICATOR 7.0 (GLOVE)
GLOVE BIOGEL PI INDICATOR 7.5 (GLOVE)
GLOVE BIOGEL PI INDICATOR 8 (GLOVE)
GLOVE ECLIPSE 8.0 STRL XLNG CF (GLOVE) IMPLANT
GOWN STRL REUS W/TWL LRG LVL3 (GOWN DISPOSABLE) ×1 IMPLANT
HOLDER FOLEY CATH W/STRAP (MISCELLANEOUS) IMPLANT
IV SOD CHL 0.9% 1000ML (IV SOLUTION) ×1 IMPLANT
KIT TURNOVER CYSTO (KITS) ×1 IMPLANT
MARKER SKIN DUAL TIP RULER LAB (MISCELLANEOUS) ×1 IMPLANT
PACK CYSTO (CUSTOM PROCEDURE TRAY) ×1 IMPLANT
SUT BONE WAX W31G (SUTURE) IMPLANT
SYR 10ML LL (SYRINGE) ×1 IMPLANT
UNDERPAD 30X30 (UNDERPADS AND DIAPERS) ×2 IMPLANT
WATER STERILE IRR 500ML POUR (IV SOLUTION) ×1 IMPLANT

## 2017-12-08 NOTE — Progress Notes (Signed)
Pts surgery cancelled due to lack of insurance approval for space oar. Patient discharged to home after IV discontinued and Pt instructed to resume usual routine until MD's office notifies him with further instructions.

## 2017-12-08 NOTE — H&P (View-Only) (Signed)
CC/HPI: CC: Prostate cancer talk  HPI:   09/15/2017:  Patient underwent a prostate biopsy on 09/02/2017. Since then, he has recovered well. Initially, he was noted to have an asymmetric prostate on digital rectal exam. Subsequent MRI of the prostate revealed a PIRADs 4 lesion and a 40 g prostate. This was at the left base. Last PSA was 4.93 on 06/01/2017. Unfortunately, MRI/ultrasound guided biopsy was positive for Gleason 3+4 adenocarcinoma in 2 out of 12 cores. There was up to 80% involvement and one of the cores, 5% involvement in the other.   Another issue for the patient is hypogonadism. He has been taking AndroGel for several years. Last testosterone was 518. This is very important to him. He has a very high degree of concern about coming off testosterone.   Bilateral inguinal hernia repairs as a child  Patient is on Cialis 5 mg daily, initially prescribed for lower urinary tract symptoms. Erections are good and he is sexually active. This is a very important to him.  No urinary incontinence.   Significant comorbidities include a new diagnosis of atrial fibrillation, history of diabetes, high cholesterol.   10/14/2017:  In the interval, the patient has seen Dr. Tammi Klippel. He is doing very well since I last saw him. He has elected to undergo brachytherapy. He is scheduled for this for next month. He has no complaints today. He is planning to see his endocrinologist to further discuss testosterone replacement therapy in the setting of prostate cancer and planning to treat that.     ALLERGIES: No Allergies    MEDICATIONS: Androgel 20.25 mg/1.25 gram per actuation (1.62 %) gel in metered-dose pump Apply 2 pumps to each upper arm and shoulder daily.  Cialis 5 mg tablet 1 tablet PO Daily  Metoprolol Tartrate  Ambien TABS Oral  Cymbalta 60 MG Oral Capsule Delayed Release Particles 0 Oral  Eliquis  Flonase 50 mcg/actuation spray, suspension 0 Nasal  Gabapentin  Jardiance  Lipitor 80 mg tablet  Oral  Lisinopril 30 mg tablet Oral  LORazepam 0.5 MG Oral Tablet Oral  MetFORMIN HCl - 1000 MG Oral Tablet 0 Oral  Praluent Pen 150 mg/ml pen injector  Trulicity  Zetia 10 mg tablet Oral     GU PSH: Prostate Needle Biopsy - 09/02/2017      PSH Notes: Hernia Repair, Rotator Cuff Repair   NON-GU PSH: Hernia Repair - 2014 Surgical Pathology, Gross And Microscopic Examination For Prostate Needle - 09/02/2017    GU PMH: Primary hypogonadism (Stable) - 09/15/2017, (Stable), - 12/02/2016, - 06/10/2016, - 12/11/2015, Hypogonadism, testicular, - 2017 Prostate Cancer - 09/15/2017 Urinary Frequency - 07/12/2017 Weak Urinary Stream - 06/10/2016 BPH w/LUTS, Benign prostatic hyperplasia with urinary obstruction - 2017 ED due to arterial insufficiency, Erectile dysfunction due to arterial insufficiency - 2017 Elevated PSA, Elevated prostate specific antigen (PSA) - 2017    NON-GU PMH: Encounter for general adult medical examination without abnormal findings, Encounter for preventive health examination - 2017 Anxiety, Anxiety (Symptom) - 2014 Personal history of other endocrine, nutritional and metabolic disease, History of hypercholesterolemia - 2014, History of diabetes mellitus, - 2014 Personal history of other specified conditions, History of insomnia - 2014 Atrial Fibrillation    FAMILY HISTORY: Acute Myocardial Infarction - Father Death In The Family Father - Father Diabetes - Brother, Grandfather Diverticulitis Of Colon - Mother, Brother Family Health Status - Mother's Age - Mother Family Health Status Number - Runs In Family Heart Disease - Father, Grandfather, Uncle   SOCIAL HISTORY: Marital  Status: Married Preferred Language: English; Ethnicity: Not Hispanic Or Latino; Race: White Current Smoking Status: Patient does not smoke anymore.  Does drink.  Drinks 1 caffeinated drink per day.     Notes: Former smoker, Caffeine Use, Marital History - Currently Married, Occupation:, Alcohol  Use   REVIEW OF SYSTEMS:    GU Review Male:   Patient reports have to strain to urinate . Patient denies frequent urination, hard to postpone urination, burning/ pain with urination, get up at night to urinate, leakage of urine, stream starts and stops, trouble starting your stream, erection problems, and penile pain.  Gastrointestinal (Upper):   Patient denies nausea, vomiting, and indigestion/ heartburn.  Gastrointestinal (Lower):   Patient reports diarrhea. Patient denies constipation.  Constitutional:   Patient reports fatigue. Patient denies fever, night sweats, and weight loss.  Skin:   Patient denies skin rash/ lesion and itching.  Eyes:   Patient denies blurred vision and double vision.  Ears/ Nose/ Throat:   Patient denies sore throat and sinus problems.  Hematologic/Lymphatic:   Patient denies swollen glands and easy bruising.  Cardiovascular:   Patient denies leg swelling and chest pains.  Respiratory:   Patient denies cough and shortness of breath.  Endocrine:   Patient denies excessive thirst.  Musculoskeletal:   Patient denies back pain and joint pain.  Neurological:   Patient denies headaches and dizziness.  Psychologic:   Patient denies depression and anxiety.   Notes: pain after voiding     VITAL SIGNS:      10/14/2017 10:49 AM  Weight 205 lb / 92.99 kg  BP 131/84 mmHg  Heart Rate 93 /min   MULTI-SYSTEM PHYSICAL EXAMINATION:    Constitutional: Well-nourished. No physical deformities. Normally developed. Good grooming.  Respiratory: No labored breathing, no use of accessory muscles.   Cardiovascular: Normal temperature, adequate perfusion of extremities  Skin: No paleness, no jaundice  Neurologic / Psychiatric: Oriented to time, oriented to place, oriented to person. No depression, no anxiety, no agitation.  Gastrointestinal: No mass, no tenderness, no rigidity, non obese abdomen. No obvious abdominal scars  Eyes: Normal conjunctivae. Normal eyelids.   Musculoskeletal: Normal gait and station of head and neck.     PAST DATA REVIEWED:  Source Of History:  Patient  Records Review:   Previous Patient Records   06/01/17 05/25/17 11/26/16 06/04/16 06/08/15 04/21/14 10/13/13 04/05/13  PSA  Total PSA 4.93 ng/mL 5.29 ng/mL 3.05 ng/mL 3.59 ng/dl 4.29  4.07  3.97  3.94   Free PSA 0.69 ng/mL    0.48  0.42     % Free PSA 14 % PSA    11  10       05/25/17 11/26/16 06/04/16 12/04/15 06/08/15 10/27/14 04/20/14 10/14/13  Hormones  Testosterone, Total 518.4 ng/dL 203.2 ng/dL 216.5 pg/dL 585.7 pg/dL 944  330  490  680     PROCEDURES:          Urinalysis Dipstick Dipstick Cont'd  Color: Yellow Bilirubin: Neg mg/dL  Appearance: Clear Ketones: Trace mg/dL  Specific Gravity: 1.025 Blood: Neg ery/uL  pH: <=5.0 Protein: Neg mg/dL  Glucose: 3+ mg/dL Urobilinogen: 0.2 mg/dL    Nitrites: Neg    Leukocyte Esterase: Neg leu/uL    ASSESSMENT:      ICD-10 Details  1 GU:   Prostate Cancer - C61   2   Primary hypogonadism - E29.1    PLAN:           Document Letter(s):  Created for Patient: Clinical  Summary         Notes:   All questions were answered in regards to brachytherapy. Also discussed his testosterone replacement therapy but he will plan to discuss this with his endocrinologist. Would certainly be reasonable to start testosterone replacement therapy after adequate treatment for his prostate cancer with no evidence of active disease.

## 2017-12-08 NOTE — H&P (Signed)
CC/HPI: CC: Prostate cancer talk  HPI:   09/15/2017:  Patient underwent a prostate biopsy on 09/02/2017. Since then, he has recovered well. Initially, he was noted to have an asymmetric prostate on digital rectal exam. Subsequent MRI of the prostate revealed a PIRADs 4 lesion and a 40 g prostate. This was at the left base. Last PSA was 4.93 on 06/01/2017. Unfortunately, MRI/ultrasound guided biopsy was positive for Gleason 3+4 adenocarcinoma in 2 out of 12 cores. There was up to 80% involvement and one of the cores, 5% involvement in the other.   Another issue for the patient is hypogonadism. He has been taking AndroGel for several years. Last testosterone was 518. This is very important to him. He has a very high degree of concern about coming off testosterone.   Bilateral inguinal hernia repairs as a child  Patient is on Cialis 5 mg daily, initially prescribed for lower urinary tract symptoms. Erections are good and he is sexually active. This is a very important to him.  No urinary incontinence.   Significant comorbidities include a new diagnosis of atrial fibrillation, history of diabetes, high cholesterol.   10/14/2017:  In the interval, the patient has seen Dr. Tammi Klippel. He is doing very well since I last saw him. He has elected to undergo brachytherapy. He is scheduled for this for next month. He has no complaints today. He is planning to see his endocrinologist to further discuss testosterone replacement therapy in the setting of prostate cancer and planning to treat that.     ALLERGIES: No Allergies    MEDICATIONS: Androgel 20.25 mg/1.25 gram per actuation (1.62 %) gel in metered-dose pump Apply 2 pumps to each upper arm and shoulder daily.  Cialis 5 mg tablet 1 tablet PO Daily  Metoprolol Tartrate  Ambien TABS Oral  Cymbalta 60 MG Oral Capsule Delayed Release Particles 0 Oral  Eliquis  Flonase 50 mcg/actuation spray, suspension 0 Nasal  Gabapentin  Jardiance  Lipitor 80 mg tablet  Oral  Lisinopril 30 mg tablet Oral  LORazepam 0.5 MG Oral Tablet Oral  MetFORMIN HCl - 1000 MG Oral Tablet 0 Oral  Praluent Pen 150 mg/ml pen injector  Trulicity  Zetia 10 mg tablet Oral     GU PSH: Prostate Needle Biopsy - 09/02/2017      PSH Notes: Hernia Repair, Rotator Cuff Repair   NON-GU PSH: Hernia Repair - 2014 Surgical Pathology, Gross And Microscopic Examination For Prostate Needle - 09/02/2017    GU PMH: Primary hypogonadism (Stable) - 09/15/2017, (Stable), - 12/02/2016, - 06/10/2016, - 12/11/2015, Hypogonadism, testicular, - 2017 Prostate Cancer - 09/15/2017 Urinary Frequency - 07/12/2017 Weak Urinary Stream - 06/10/2016 BPH w/LUTS, Benign prostatic hyperplasia with urinary obstruction - 2017 ED due to arterial insufficiency, Erectile dysfunction due to arterial insufficiency - 2017 Elevated PSA, Elevated prostate specific antigen (PSA) - 2017    NON-GU PMH: Encounter for general adult medical examination without abnormal findings, Encounter for preventive health examination - 2017 Anxiety, Anxiety (Symptom) - 2014 Personal history of other endocrine, nutritional and metabolic disease, History of hypercholesterolemia - 2014, History of diabetes mellitus, - 2014 Personal history of other specified conditions, History of insomnia - 2014 Atrial Fibrillation    FAMILY HISTORY: Acute Myocardial Infarction - Father Death In The Family Father - Father Diabetes - Brother, Grandfather Diverticulitis Of Colon - Mother, Brother Family Health Status - Mother's Age - Mother Family Health Status Number - Runs In Family Heart Disease - Father, Grandfather, Uncle   SOCIAL HISTORY: Marital  Status: Married Preferred Language: English; Ethnicity: Not Hispanic Or Latino; Race: White Current Smoking Status: Patient does not smoke anymore.  Does drink.  Drinks 1 caffeinated drink per day.     Notes: Former smoker, Caffeine Use, Marital History - Currently Married, Occupation:, Alcohol  Use   REVIEW OF SYSTEMS:    GU Review Male:   Patient reports have to strain to urinate . Patient denies frequent urination, hard to postpone urination, burning/ pain with urination, get up at night to urinate, leakage of urine, stream starts and stops, trouble starting your stream, erection problems, and penile pain.  Gastrointestinal (Upper):   Patient denies nausea, vomiting, and indigestion/ heartburn.  Gastrointestinal (Lower):   Patient reports diarrhea. Patient denies constipation.  Constitutional:   Patient reports fatigue. Patient denies fever, night sweats, and weight loss.  Skin:   Patient denies skin rash/ lesion and itching.  Eyes:   Patient denies blurred vision and double vision.  Ears/ Nose/ Throat:   Patient denies sore throat and sinus problems.  Hematologic/Lymphatic:   Patient denies swollen glands and easy bruising.  Cardiovascular:   Patient denies leg swelling and chest pains.  Respiratory:   Patient denies cough and shortness of breath.  Endocrine:   Patient denies excessive thirst.  Musculoskeletal:   Patient denies back pain and joint pain.  Neurological:   Patient denies headaches and dizziness.  Psychologic:   Patient denies depression and anxiety.   Notes: pain after voiding     VITAL SIGNS:      10/14/2017 10:49 AM  Weight 205 lb / 92.99 kg  BP 131/84 mmHg  Heart Rate 93 /min   MULTI-SYSTEM PHYSICAL EXAMINATION:    Constitutional: Well-nourished. No physical deformities. Normally developed. Good grooming.  Respiratory: No labored breathing, no use of accessory muscles.   Cardiovascular: Normal temperature, adequate perfusion of extremities  Skin: No paleness, no jaundice  Neurologic / Psychiatric: Oriented to time, oriented to place, oriented to person. No depression, no anxiety, no agitation.  Gastrointestinal: No mass, no tenderness, no rigidity, non obese abdomen. No obvious abdominal scars  Eyes: Normal conjunctivae. Normal eyelids.   Musculoskeletal: Normal gait and station of head and neck.     PAST DATA REVIEWED:  Source Of History:  Patient  Records Review:   Previous Patient Records   06/01/17 05/25/17 11/26/16 06/04/16 06/08/15 04/21/14 10/13/13 04/05/13  PSA  Total PSA 4.93 ng/mL 5.29 ng/mL 3.05 ng/mL 3.59 ng/dl 4.29  4.07  3.97  3.94   Free PSA 0.69 ng/mL    0.48  0.42     % Free PSA 14 % PSA    11  10       05/25/17 11/26/16 06/04/16 12/04/15 06/08/15 10/27/14 04/20/14 10/14/13  Hormones  Testosterone, Total 518.4 ng/dL 203.2 ng/dL 216.5 pg/dL 585.7 pg/dL 944  330  490  680     PROCEDURES:          Urinalysis Dipstick Dipstick Cont'd  Color: Yellow Bilirubin: Neg mg/dL  Appearance: Clear Ketones: Trace mg/dL  Specific Gravity: 1.025 Blood: Neg ery/uL  pH: <=5.0 Protein: Neg mg/dL  Glucose: 3+ mg/dL Urobilinogen: 0.2 mg/dL    Nitrites: Neg    Leukocyte Esterase: Neg leu/uL    ASSESSMENT:      ICD-10 Details  1 GU:   Prostate Cancer - C61   2   Primary hypogonadism - E29.1    PLAN:           Document Letter(s):  Created for Patient: Clinical  Summary         Notes:   All questions were answered in regards to brachytherapy. Also discussed his testosterone replacement therapy but he will plan to discuss this with his endocrinologist. Would certainly be reasonable to start testosterone replacement therapy after adequate treatment for his prostate cancer with no evidence of active disease.

## 2017-12-08 NOTE — Anesthesia Preprocedure Evaluation (Addendum)
Anesthesia Evaluation  Patient identified by MRN, date of birth, ID band Patient awake    Reviewed: Allergy & Precautions, NPO status , Patient's Chart, lab work & pertinent test results, reviewed documented beta blocker date and time   History of Anesthesia Complications Negative for: history of anesthetic complications  Airway Mallampati: II  TM Distance: >3 FB Neck ROM: Full    Dental  (+) Teeth Intact, Dental Advisory Given   Pulmonary sleep apnea (AHI 86) and Continuous Positive Airway Pressure Ventilation , former smoker (quit 1977),    Pulmonary exam normal        Cardiovascular hypertension, Pt. on medications and Pt. on home beta blockers (-) anginaNormal cardiovascular exam+ dysrhythmias Atrial Fibrillation and Supra Ventricular Tachycardia   7/19 ECHO: EF 60-65%, valves ok   Neuro/Psych PSYCHIATRIC DISORDERS Anxiety negative neurological ROS     GI/Hepatic Neg liver ROS, neg GERD  ,  Endo/Other  diabetes (glu 114), Insulin Dependent, Oral Hypoglycemic Agents  Renal/GU negative Renal ROS     Musculoskeletal  (+) Arthritis ,   Abdominal   Peds  Hematology  (+) REFUSES BLOOD PRODUCTS, JEHOVAH'S WITNESSeliquis   Anesthesia Other Findings Jehovah's witness -No blood products  Reproductive/Obstetrics                          Anesthesia Physical Anesthesia Plan  ASA: III  Anesthesia Plan: General   Post-op Pain Management:    Induction: Intravenous  PONV Risk Score and Plan: 2 and Ondansetron, Dexamethasone, Midazolam and Treatment may vary due to age or medical condition  Airway Management Planned: LMA  Additional Equipment: None  Intra-op Plan:   Post-operative Plan: Extubation in OR  Informed Consent: I have reviewed the patients History and Physical, chart, labs and discussed the procedure including the risks, benefits and alternatives for the proposed anesthesia with the  patient or authorized representative who has indicated his/her understanding and acceptance.     Plan Discussed with:   Anesthesia Plan Comments: (Case cancelled on 12/08/17 due to insurance authorization issues.)      Anesthesia Quick Evaluation

## 2017-12-09 ENCOUNTER — Encounter: Payer: Self-pay | Admitting: Physical Therapy

## 2017-12-09 NOTE — Telephone Encounter (Signed)
CY - please advise. Thanks! 

## 2017-12-13 ENCOUNTER — Other Ambulatory Visit: Payer: Self-pay | Admitting: Pharmacist

## 2017-12-13 ENCOUNTER — Telehealth: Payer: Self-pay

## 2017-12-13 MED ORDER — ALIROCUMAB 150 MG/ML ~~LOC~~ SOAJ: 150.0000 mg | SUBCUTANEOUS | 11 refills | Status: DC

## 2017-12-13 NOTE — Telephone Encounter (Signed)
Per Dr. Radford Pax, patient has been give Zaleplon 5 mg, by many providers. She advised this be placed in his record for reference.

## 2017-12-14 ENCOUNTER — Other Ambulatory Visit: Payer: Self-pay | Admitting: Urology

## 2017-12-15 ENCOUNTER — Encounter: Payer: Self-pay | Admitting: Emergency Medicine

## 2017-12-15 DIAGNOSIS — F329 Major depressive disorder, single episode, unspecified: Secondary | ICD-10-CM | POA: Insufficient documentation

## 2017-12-15 DIAGNOSIS — F411 Generalized anxiety disorder: Secondary | ICD-10-CM | POA: Insufficient documentation

## 2017-12-20 NOTE — Addendum Note (Signed)
Addended by: Link Snuffer D III on: 12/20/2017 03:09 PM   Modules accepted: Orders

## 2017-12-20 NOTE — Telephone Encounter (Signed)
Please contact Ashly with [employer] and let him know what is going on.

## 2017-12-21 ENCOUNTER — Telehealth: Payer: Self-pay | Admitting: Internal Medicine

## 2017-12-21 NOTE — Telephone Encounter (Signed)
Called [employer] and spoke with Bethanne Ginger to see if Caryl Pina was at the office. Per Levon Hedger was currently not there but she stated to me that I could call him on his cell at [phone].  Attempted to call Caryl Pina, but was unable to reach him. Left Caryl Pina a message to return our call.

## 2017-12-21 NOTE — Telephone Encounter (Signed)
Spoke with Ashly at [employer] patient has financial arrangements to discuss with [employer] for future supplies. Ashly will have Estill Bamberg with [employer](deals with billing) to call patient to review this matter.  Nothing more needed at this time.

## 2017-12-24 ENCOUNTER — Ambulatory Visit (HOSPITAL_COMMUNITY): Admission: RE | Admit: 2017-12-24 | Payer: BLUE CROSS/BLUE SHIELD | Source: Ambulatory Visit

## 2017-12-24 ENCOUNTER — Encounter (HOSPITAL_COMMUNITY): Payer: Self-pay

## 2017-12-24 NOTE — Patient Instructions (Signed)
Your procedure is scheduled on: Tuesday, Dec. 17, 2019   Surgery Time:  7:30AM-8:45AM   Report to [facility]  Entrance    Report to admitting at 5:30 AM   Call this number if you have problems the morning of surgery [phone]   Do not eat food or drink liquids :After Midnight.   Use fleet enema the morning of surgery   Brush your teeth the morning of surgery.   Do NOT smoke after Midnight   Take these medicines the morning of surgery with A SIP OF WATER: Atorvastatin, Duloxetine, Metoprolol, Ezetimibe, Gabapentin, Lorazepam if needed   May use eye drops if needed   Do not take Cilis with 24 hours of surgery  DO NOT TAKE ANY DIABETIC MEDICATIONS DAY OF YOUR SURGERY  Bring CPAP mask and tubing day of surgery                               You may not have any metal on your body including  jewelry, and body piercings             Do not wear lotions, powders, perfumes/cologne, or deodorant                          Men may shave face and neck.   Do not bring valuables to the hospital. [place].   Contacts, dentures or bridgework may not be worn into surgery.    Patients discharged the day of surgery will not be allowed to drive home.   Special Instructions: Bring a copy of your healthcare power of attorney and living will documents         the day of surgery if you haven't scanned them in before.              Please read over the following fact sheets you were given:  [facility] - Preparing for Surgery Before surgery, you can play an important role.  Because skin is not sterile, your skin needs to be as free of germs as possible.  You can reduce the number of germs on your skin by washing with CHG (chlorahexidine gluconate) soap before surgery.  CHG is an antiseptic cleaner which kills germs and bonds with the skin to continue killing germs even after washing. Please DO NOT use if you have an allergy to  CHG or antibacterial soaps.  If your skin becomes reddened/irritated stop using the CHG and inform your nurse when you arrive at Short Stay. Do not shave (including legs and underarms) for at least 48 hours prior to the first CHG shower.  You may shave your face/neck.  Please follow these instructions carefully:  1.  Shower with CHG Soap the night before surgery and the  morning of surgery.  2.  If you choose to wash your hair, wash your hair first as usual with your normal  shampoo.  3.  After you shampoo, rinse your hair and body thoroughly to remove the shampoo.                             4.  Use CHG as you would any other liquid soap.  You can apply chg directly to the skin  and wash.  Gently with a scrungie or clean washcloth.  5.  Apply the CHG Soap to your body ONLY FROM THE NECK DOWN.   Do   not use on face/ open                           Wound or open sores. Avoid contact with eyes, ears mouth and   genitals (private parts).                       Wash face,  Genitals (private parts) with your normal soap.             6.  Wash thoroughly, paying special attention to the area where your    surgery  will be performed.  7.  Thoroughly rinse your body with warm water from the neck down.  8.  DO NOT shower/wash with your normal soap after using and rinsing off the CHG Soap.                9.  Pat yourself dry with a clean towel.            10.  Wear clean pajamas.            11.  Place clean sheets on your bed the night of your first shower and do not  sleep with pets. Day of Surgery : Do not apply any lotions/deodorants the morning of surgery.  Please wear clean clothes to the hospital/surgery center.  FAILURE TO FOLLOW THESE INSTRUCTIONS MAY RESULT IN THE CANCELLATION OF YOUR SURGERY  PATIENT SIGNATURE_________________________________  NURSE SIGNATURE__________________________________  ________________________________________________________________________

## 2017-12-24 NOTE — Pre-Procedure Instructions (Signed)
The following are in epic: Cardiac clearance Dr. Radford Pax telephone enounter 10/14/2017 EKG 09/27/2017 CXR 07/27/2017 ECHO 07/26/2017 Stress test 04/15/2016

## 2017-12-27 ENCOUNTER — Encounter (HOSPITAL_COMMUNITY)
Admission: RE | Admit: 2017-12-27 | Discharge: 2017-12-27 | Disposition: A | Discharge status: Home / Self Care | Payer: BLUE CROSS/BLUE SHIELD | Source: Ambulatory Visit | Attending: Urology | Admitting: Urology

## 2017-12-27 ENCOUNTER — Encounter (HOSPITAL_COMMUNITY): Payer: Self-pay

## 2017-12-27 ENCOUNTER — Other Ambulatory Visit: Payer: Self-pay

## 2017-12-27 DIAGNOSIS — Z01812 Encounter for preprocedural laboratory examination: Secondary | ICD-10-CM | POA: Insufficient documentation

## 2017-12-27 DIAGNOSIS — C61 Malignant neoplasm of prostate: Secondary | ICD-10-CM | POA: Diagnosis not present

## 2017-12-27 HISTORY — DX: Unilateral inguinal hernia, without obstruction or gangrene, not specified as recurrent: K40.90

## 2017-12-27 HISTORY — DX: Spinal stenosis, site unspecified: M48.00

## 2017-12-27 HISTORY — DX: Anesthesia of skin: R20.0

## 2017-12-27 HISTORY — DX: Unspecified rotator cuff tear or rupture of unspecified shoulder, not specified as traumatic: M75.100

## 2017-12-27 HISTORY — DX: Gastro-esophageal reflux disease without esophagitis: K21.9

## 2017-12-27 HISTORY — DX: Benign lipomatous neoplasm of skin and subcutaneous tissue of head, face and neck: D17.0

## 2017-12-27 LAB — CBC
HCT: 50.5 % (ref 39.0–52.0)
Hemoglobin: 17.1 g/dL — ABNORMAL HIGH (ref 13.0–17.0)
MCH: 31.4 pg (ref 26.0–34.0)
MCHC: 33.9 g/dL (ref 30.0–36.0)
MCV: 92.7 fL (ref 80.0–100.0)
PLATELETS: 183 10*3/uL (ref 150–400)
RBC: 5.45 MIL/uL (ref 4.22–5.81)
RDW: 12.5 % (ref 11.5–15.5)
WBC: 7 10*3/uL (ref 4.0–10.5)
nRBC: 0 % (ref 0.0–0.2)

## 2017-12-27 LAB — COMPREHENSIVE METABOLIC PANEL
ALT: 39 U/L (ref 0–44)
AST: 27 U/L (ref 15–41)
Albumin: 4.5 g/dL (ref 3.5–5.0)
Alkaline Phosphatase: 55 U/L (ref 38–126)
Anion gap: 12 (ref 5–15)
BUN: 16 mg/dL (ref 6–20)
CHLORIDE: 102 mmol/L (ref 98–111)
CO2: 22 mmol/L (ref 22–32)
Calcium: 9.8 mg/dL (ref 8.9–10.3)
Creatinine, Ser: 0.78 mg/dL (ref 0.61–1.24)
GFR calc non Af Amer: >60 mL/min (ref >60)
Glucose, Bld: 262 mg/dL — ABNORMAL HIGH (ref 70–99)
Potassium: 4.6 mmol/L (ref 3.5–5.1)
SODIUM: 136 mmol/L (ref 135–145)
Total Bilirubin: 0.6 mg/dL (ref 0.3–1.2)
Total Protein: 7.1 g/dL (ref 6.5–8.1)

## 2017-12-27 LAB — APTT: APTT: 27 s (ref 24–36)

## 2017-12-27 LAB — PROTIME-INR
INR: 0.98
Prothrombin Time: 12.9 seconds (ref 11.4–15.2)

## 2017-12-27 LAB — GLUCOSE, CAPILLARY: Glucose-Capillary: 258 mg/dL — ABNORMAL HIGH (ref 70–99)

## 2017-12-27 LAB — HEMOGLOBIN A1C
Hgb A1c MFr Bld: 8.2 % — ABNORMAL HIGH (ref 4.8–5.6)
Mean Plasma Glucose: 188.64 mg/dL

## 2017-12-27 MED ORDER — FLEET ENEMA 7-19 GM/118ML RE ENEM
1.0000 | ENEMA | Freq: Once | RECTAL | Status: DC
  Filled 2017-12-27: qty 1

## 2017-12-27 NOTE — Pre-Procedure Instructions (Signed)
CBC and CMP results 12/27/2017 sent to Dr. Gloriann Loan via epic.

## 2017-12-28 ENCOUNTER — Other Ambulatory Visit: Payer: Self-pay | Admitting: Physician Assistant

## 2017-12-28 DIAGNOSIS — Z01812 Encounter for preprocedural laboratory examination: Secondary | ICD-10-CM | POA: Diagnosis not present

## 2017-12-28 NOTE — Telephone Encounter (Signed)
Has office visit tomorrow with provider, won't send refill today.

## 2017-12-28 NOTE — Telephone Encounter (Signed)
Need to review paper chart  

## 2017-12-28 NOTE — Pre-Procedure Instructions (Signed)
Hgb A1C results 12/27/2017 sent to Dr. Gloriann Loan via epic.

## 2017-12-29 ENCOUNTER — Ambulatory Visit (INDEPENDENT_AMBULATORY_CARE_PROVIDER_SITE_OTHER): Payer: BLUE CROSS/BLUE SHIELD | Admitting: Physician Assistant

## 2017-12-29 ENCOUNTER — Encounter: Payer: Self-pay | Admitting: Physician Assistant

## 2017-12-29 DIAGNOSIS — G4733 Obstructive sleep apnea (adult) (pediatric): Secondary | ICD-10-CM

## 2017-12-29 DIAGNOSIS — F331 Major depressive disorder, recurrent, moderate: Secondary | ICD-10-CM | POA: Diagnosis not present

## 2017-12-29 DIAGNOSIS — F411 Generalized anxiety disorder: Secondary | ICD-10-CM | POA: Diagnosis not present

## 2017-12-29 DIAGNOSIS — G4700 Insomnia, unspecified: Secondary | ICD-10-CM | POA: Diagnosis not present

## 2017-12-29 DIAGNOSIS — C61 Malignant neoplasm of prostate: Secondary | ICD-10-CM

## 2017-12-29 MED ORDER — DULOXETINE HCL 60 MG PO CPEP: 120.0000 mg | ORAL_CAPSULE | ORAL | 1 refills | Status: DC

## 2017-12-29 MED ORDER — LORAZEPAM 1 MG PO TABS: 1.0000 mg | ORAL_TABLET | Freq: Four times a day (QID) | ORAL | 1 refills | Status: AC | PRN

## 2017-12-29 MED ORDER — ZOLPIDEM TARTRATE 10 MG PO TABS: 10.0000 mg | ORAL_TABLET | Freq: Every evening | ORAL | 1 refills | Status: DC | PRN

## 2017-12-29 MED ORDER — GABAPENTIN 600 MG PO TABS: 600.0000 mg | ORAL_TABLET | Freq: Three times a day (TID) | ORAL | 1 refills | Status: DC

## 2017-12-29 NOTE — Progress Notes (Signed)
[facility]  Patient ID: Brandon Frank,  MRN: [id number]  PCP: [name], MD  Date of Evaluation: 12/30/2017 Time spent:25 minutes  Chief Complaint:  Chief Complaint    Anxiety; Depression      HISTORY/CURRENT STATUS: HPI Here for 6 month med check.   Has new health problems, see below.   "I don't feel depressed. I can't do as much as I could but it's b/c of my back."  Patient denies loss of interest in usual activities and is able to enjoy things.  Denies decreased energy or motivation.  Appetite has not changed.  No extreme sadness, tearfulness, or feelings of hopelessness.  Denies any changes in concentration, making decisions or remembering things.  Denies suicidal or homicidal thoughts.   He's having to go off Testosterone b/c of the prostate cancer.  He's been on it a long time.  He knows he's going to have no testosterone, with hot flashes, depression, weight gain.  He's frustrated b/c it's like his urologist isn't listening, and he saw an endocrinologist who says that he's not able to do anything about it.    He and wife are having a lot of problems.  Wife has cut him off sexually and isn't empathetic at all.  Goes out with her GFs and seems to think that's enough.    Anxiety is fairly well-controlled. The worry of the cancer has been tough, and they have put off the surgery.  He was on the operating table when the surgery was cancelled b/c the urologist didn't clear something with the insurance.  So that has been extremely frustrating.  Sleeps okay with the Ambien.  Individual Medical History/ Review of Systems: Changes? :Yes  Has been dx with prostate cancer.  Has surgery scheduled next week for radioactive seeds.   Also dx w/ Afib and Flutter, Diabetes is out of control. Also dx with 5 bulging discs in L-spine.  Has been through PT and injections which have not helped.   Past medications for mental health diagnoses include: Paxil, Zoloft,  Cymbalta, Lunesta, Sonata   Allergies: Patient has no known allergies.  Current Medications:  Current Outpatient Medications:  .  acetaminophen (TYLENOL) 500 MG tablet, Take 1,000 mg by mouth 2 (two) times daily as needed for moderate pain or headache., Disp: , Rfl:  .  Alirocumab (PRALUENT) 150 MG/ML SOAJ, Inject 150 mg into the skin every 14 (fourteen) days., Disp: 2 pen, Rfl: 11 .  apixaban (ELIQUIS) 5 MG TABS tablet, Take 5 mg by mouth 2 (two) times daily., Disp: , Rfl:  .  atorvastatin (LIPITOR) 80 MG tablet, Take 80 mg by mouth every morning. , Disp: , Rfl:  .  BAYER MICROLET LANCETS lancets, USE   TO CHECK GLUCOSE TWICE DAILY, Disp: 100 each, Rfl: 5 .  calcium carbonate (TUMS - DOSED IN MG ELEMENTAL CALCIUM) 500 MG chewable tablet, Chew 2 tablets by mouth daily as needed for indigestion or heartburn. , Disp: , Rfl:  .  CONTOUR NEXT TEST test strip, USE 1 STRIP TO CHECK GLUCOSE ONCE DAILY, Disp: 50 each, Rfl: 25 .  cyclobenzaprine (FLEXERIL) 10 MG tablet, Take 1 tablet by mouth at bed time as needed for muscle spasms., Disp: 30 tablet, Rfl: 0 .  Dulaglutide (TRULICITY) 8.31 DV/7.6HY SOPN, Inject 0.75 mg into the skin once a week., Disp: 2 mL, Rfl: 11 .  Dulaglutide (TRULICITY) 1.5 WV/3.7TG SOPN, Inject 1.5 mg into the skin every Friday., Disp: , Rfl:  .  DULoxetine (CYMBALTA) 60 MG capsule, Take 2-3 capsules (120-180 mg total) by mouth See admin instructions. Alternate taking 180 and 120 mg once daily Per Dr Candis Schatz 3 tabs one day then 2 the next day and so on., Disp: 225 capsule, Rfl: 1 .  ezetimibe (ZETIA) 10 MG tablet, TAKE 1 TABLET BY MOUTH ONCE DAILY (Patient taking differently: Take 10 mg by mouth every morning. ), Disp: 90 tablet, Rfl: 1 .  fluticasone (FLONASE) 50 MCG/ACT nasal spray, Place 1 spray into both nostrils daily as needed for allergies or rhinitis., Disp: , Rfl:  .  gabapentin (NEURONTIN) 600 MG tablet, Take 1 tablet (600 mg total) by mouth 3 (three) times daily., Disp:  270 tablet, Rfl: 1 .  glucose blood test strip, One touch verio.  Test once daily. Dx E11.9, Disp: 100 each, Rfl: 12 .  JARDIANCE 10 MG TABS tablet, TAKE 1 TABLET BY MOUTH ONCE DAILY, Disp: 90 tablet, Rfl: 3 .  Lancets (ONETOUCH ULTRASOFT) lancets, One touch verio lancets.  Test once daily.  Dx e11.9, Disp: 100 each, Rfl: 12 .  LORazepam (ATIVAN) 1 MG tablet, Take 1 tablet (1 mg total) by mouth 4 (four) times daily as needed for anxiety., Disp: 360 tablet, Rfl: 1 .  metFORMIN (GLUCOPHAGE) 500 MG tablet, TAKE 2 TABLETS BY MOUTH TWICE DAILY WITH A MEAL (Patient taking differently: Take 1,000 mg by mouth 2 (two) times daily. ), Disp: 360 tablet, Rfl: 0 .  metoprolol succinate (TOPROL-XL) 100 MG 24 hr tablet, Take 1 tablet (100 mg total) by mouth daily. Take with or immediately following a meal., Disp: 90 tablet, Rfl: 3 .  NON FORMULARY, Apply 1 application topically daily. CBD paste, Disp: , Rfl:  .  oxyCODONE (OXY IR/ROXICODONE) 5 MG immediate release tablet, Take 5-10 mg by mouth 3 (three) times daily as needed for severe pain. , Disp: , Rfl: 0 .  Polyethyl Glycol-Propyl Glycol (SYSTANE OP), Place 1 drop into both eyes daily as needed (dryness)., Disp: , Rfl:  .  tadalafil (CIALIS) 5 MG tablet, Take 5 mg by mouth daily. , Disp: , Rfl:  .  Tetrahydrozoline HCl (VISINE OP), Place 1 drop into both eyes daily as needed (redness/irritation)., Disp: , Rfl:  .  traMADol (ULTRAM) 50 MG tablet, Take 50 mg by mouth 3 (three) times daily as needed for moderate pain. , Disp: , Rfl:  .  zolpidem (AMBIEN) 10 MG tablet, Take 1 tablet (10 mg total) by mouth at bedtime as needed. for sleep, Disp: 90 tablet, Rfl: 1 .  lisinopril (PRINIVIL,ZESTRIL) 2.5 MG tablet, Take 1 tablet (2.5 mg total) by mouth daily., Disp: 90 tablet, Rfl: 3 Medication Side Effects: none  Family Medical/ Social History: Changes? No  MENTAL HEALTH EXAM:  There were no vitals taken for this visit.There is no height or weight on file to  calculate BMI.  General Appearance: Casual and Well Groomed  Eye Contact:  Good  Speech:  Clear and Coherent  Volume:  Normal  Mood:  Euthymic  Affect:  Appropriate  Thought Process:  Goal Directed  Orientation:  Full (Time, Place, and Person)  Thought Content: Logical   Suicidal Thoughts:  No  Homicidal Thoughts:  No  Memory:  WNL  Judgement:  Good  Insight:  Good  Psychomotor Activity:  Normal  Concentration:  Concentration: Good  Recall:  Good  Fund of Knowledge: Good  Language: Good  Assets:  Desire for Improvement  ADL's:  Intact  Cognition: WNL  Prognosis:  Good  DIAGNOSES:    ICD-10-CM   1. Major depressive disorder, recurrent episode, moderate (HCC) F33.1   2. Generalized anxiety disorder F41.1   3. Insomnia, unspecified type G47.00   4. Obstructive sleep apnea G47.33   5. Malignant neoplasm of prostate ([place]) C61     Receiving Psychotherapy: No    RECOMMENDATIONS: I spent 25 minutes with the patient and 50% of that time was spent in counseling.  We discussed his new health problems and the effects that have been on his mental health. Increase gabapentin 600 mg from twice daily to 3 times daily. Continue all other medications. We discussed therapy.  He has asked about sex therapy.  I will discuss with several of our counselors here and get recommendations and give him a call back with those. Return in 6 weeks or sooner as needed  Donnal Moat, PA-C

## 2018-01-03 ENCOUNTER — Telehealth: Payer: Self-pay | Admitting: *Deleted

## 2018-01-03 NOTE — Telephone Encounter (Signed)
CALLED PATIENT TO REMIND OF IMPLANT FOR 01-04-18, LVM FOR A RETURN CALL

## 2018-01-03 NOTE — Anesthesia Preprocedure Evaluation (Addendum)
Anesthesia Evaluation  Patient identified by MRN, date of birth, ID band Patient awake    Reviewed: Allergy & Precautions, NPO status , Patient's Chart, lab work & pertinent test results, reviewed documented beta blocker date and time   History of Anesthesia Complications Negative for: history of anesthetic complications  Airway Mallampati: II  TM Distance: >3 FB Neck ROM: Full    Dental no notable dental hx. (+) Dental Advisory Given   Pulmonary sleep apnea (AHI 86) and Continuous Positive Airway Pressure Ventilation , former smoker,    Pulmonary exam normal        Cardiovascular hypertension, Pt. on medications and Pt. on home beta blockers (-) anginaNormal cardiovascular exam+ dysrhythmias Atrial Fibrillation and Supra Ventricular Tachycardia   7/19 ECHO: EF 60-65%, valves ok   Neuro/Psych PSYCHIATRIC DISORDERS Anxiety negative neurological ROS     GI/Hepatic Neg liver ROS, neg GERD  ,  Endo/Other  diabetes, Insulin Dependent, Oral Hypoglycemic Agents  Renal/GU negative Renal ROS     Musculoskeletal  (+) Arthritis ,   Abdominal   Peds  Hematology  (+) REFUSES BLOOD PRODUCTS, JEHOVAH'S WITNESSeliquis   Anesthesia Other Findings Jehovah's witness -No blood products  Reproductive/Obstetrics                            Anesthesia Physical  Anesthesia Plan  ASA: III  Anesthesia Plan: General   Post-op Pain Management:    Induction: Intravenous  PONV Risk Score and Plan: 3 and Ondansetron, Dexamethasone, Treatment may vary due to age or medical condition and Scopolamine patch - Pre-op  Airway Management Planned: LMA  Additional Equipment: None  Intra-op Plan:   Post-operative Plan: Extubation in OR  Informed Consent: I have reviewed the patients History and Physical, chart, labs and discussed the procedure including the risks, benefits and alternatives for the proposed anesthesia  with the patient or authorized representative who has indicated his/her understanding and acceptance.   Dental advisory given  Plan Discussed with: Anesthesiologist and CRNA  Anesthesia Plan Comments:        Anesthesia Quick Evaluation

## 2018-01-04 ENCOUNTER — Ambulatory Visit (HOSPITAL_COMMUNITY): Payer: BLUE CROSS/BLUE SHIELD | Admitting: Anesthesiology

## 2018-01-04 ENCOUNTER — Ambulatory Visit (HOSPITAL_COMMUNITY)
Admission: RE | Admit: 2018-01-04 | Discharge: 2018-01-04 | Disposition: A | Discharge status: Home / Self Care | Payer: BLUE CROSS/BLUE SHIELD | Attending: Urology | Admitting: Urology

## 2018-01-04 ENCOUNTER — Encounter (HOSPITAL_COMMUNITY)
Admission: RE | Disposition: A | Discharge status: Home / Self Care | Payer: Self-pay | Source: Home / Self Care | Attending: Urology

## 2018-01-04 ENCOUNTER — Encounter (HOSPITAL_COMMUNITY): Payer: Self-pay

## 2018-01-04 ENCOUNTER — Ambulatory Visit (HOSPITAL_COMMUNITY): Payer: BLUE CROSS/BLUE SHIELD

## 2018-01-04 DIAGNOSIS — Z79899 Other long term (current) drug therapy: Secondary | ICD-10-CM | POA: Diagnosis not present

## 2018-01-04 DIAGNOSIS — E119 Type 2 diabetes mellitus without complications: Secondary | ICD-10-CM | POA: Diagnosis not present

## 2018-01-04 DIAGNOSIS — C61 Malignant neoplasm of prostate: Secondary | ICD-10-CM | POA: Insufficient documentation

## 2018-01-04 DIAGNOSIS — Z7901 Long term (current) use of anticoagulants: Secondary | ICD-10-CM | POA: Diagnosis not present

## 2018-01-04 DIAGNOSIS — I4891 Unspecified atrial fibrillation: Secondary | ICD-10-CM | POA: Diagnosis not present

## 2018-01-04 DIAGNOSIS — I10 Essential (primary) hypertension: Secondary | ICD-10-CM | POA: Insufficient documentation

## 2018-01-04 DIAGNOSIS — Z794 Long term (current) use of insulin: Secondary | ICD-10-CM | POA: Insufficient documentation

## 2018-01-04 DIAGNOSIS — E7800 Pure hypercholesterolemia, unspecified: Secondary | ICD-10-CM | POA: Diagnosis not present

## 2018-01-04 DIAGNOSIS — Z87891 Personal history of nicotine dependence: Secondary | ICD-10-CM | POA: Insufficient documentation

## 2018-01-04 HISTORY — PX: RADIOACTIVE SEED IMPLANT: SHX5150

## 2018-01-04 LAB — GLUCOSE, CAPILLARY
GLUCOSE-CAPILLARY: 162 mg/dL — AB (ref 70–99)
Glucose-Capillary: 156 mg/dL — ABNORMAL HIGH (ref 70–99)

## 2018-01-04 SURGERY — INSERTION, RADIATION SOURCE, PROSTATE
Anesthesia: General

## 2018-01-04 MED ORDER — SODIUM CHLORIDE 0.9 % IR SOLN
Status: DC | PRN
  Administered 2018-01-04: 1000 mL

## 2018-01-04 MED ORDER — ONDANSETRON HCL 4 MG/2ML IJ SOLN
INTRAMUSCULAR | Status: DC | PRN
  Administered 2018-01-04: 4 mg via INTRAVENOUS

## 2018-01-04 MED ORDER — PROMETHAZINE HCL 25 MG/ML IJ SOLN: 6.2500 mg | INTRAMUSCULAR | Status: DC | PRN

## 2018-01-04 MED ORDER — LACTATED RINGERS IV SOLN
INTRAVENOUS | Status: DC
  Administered 2018-01-04: 1000 mL via INTRAVENOUS
  Administered 2018-01-04 (×2): via INTRAVENOUS

## 2018-01-04 MED ORDER — PROPOFOL 10 MG/ML IV BOLUS
INTRAVENOUS | Status: AC
  Filled 2018-01-04: qty 20

## 2018-01-04 MED ORDER — PROPOFOL 10 MG/ML IV BOLUS
INTRAVENOUS | Status: DC | PRN
  Administered 2018-01-04: 200 mg via INTRAVENOUS

## 2018-01-04 MED ORDER — HYDROMORPHONE HCL 1 MG/ML IJ SOLN: 0.2500 mg | INTRAMUSCULAR | Status: DC | PRN

## 2018-01-04 MED ORDER — CIPROFLOXACIN IN D5W 400 MG/200ML IV SOLN
400.0000 mg | INTRAVENOUS | Status: AC
  Administered 2018-01-04: 400 mg via INTRAVENOUS
  Filled 2018-01-04: qty 200

## 2018-01-04 MED ORDER — DEXAMETHASONE SODIUM PHOSPHATE 10 MG/ML IJ SOLN
INTRAMUSCULAR | Status: DC | PRN
  Administered 2018-01-04: 5 mg via INTRAVENOUS

## 2018-01-04 MED ORDER — OXYCODONE HCL 5 MG PO TABS: 5.0000 mg | ORAL_TABLET | Freq: Three times a day (TID) | ORAL | 0 refills | Status: AC | PRN

## 2018-01-04 MED ORDER — FENTANYL CITRATE (PF) 100 MCG/2ML IJ SOLN
INTRAMUSCULAR | Status: DC | PRN
  Administered 2018-01-04 (×4): 50 ug via INTRAVENOUS

## 2018-01-04 MED ORDER — MIDAZOLAM HCL 5 MG/5ML IJ SOLN
INTRAMUSCULAR | Status: DC | PRN
  Administered 2018-01-04: 2 mg via INTRAVENOUS

## 2018-01-04 MED ORDER — LIDOCAINE 2% (20 MG/ML) 5 ML SYRINGE
INTRAMUSCULAR | Status: AC
  Filled 2018-01-04: qty 5

## 2018-01-04 MED ORDER — FENTANYL CITRATE (PF) 100 MCG/2ML IJ SOLN
INTRAMUSCULAR | Status: AC
  Filled 2018-01-04: qty 2

## 2018-01-04 MED ORDER — SCOPOLAMINE 1 MG/3DAYS TD PT72
1.0000 | MEDICATED_PATCH | TRANSDERMAL | Status: DC
  Administered 2018-01-04: 1.5 mg via TRANSDERMAL
  Filled 2018-01-04: qty 1

## 2018-01-04 MED ORDER — MIDAZOLAM HCL 2 MG/2ML IJ SOLN
INTRAMUSCULAR | Status: AC
  Filled 2018-01-04: qty 2

## 2018-01-04 MED ORDER — ONDANSETRON HCL 4 MG/2ML IJ SOLN
INTRAMUSCULAR | Status: AC
  Filled 2018-01-04: qty 2

## 2018-01-04 MED ORDER — PHENYLEPHRINE 40 MCG/ML (10ML) SYRINGE FOR IV PUSH (FOR BLOOD PRESSURE SUPPORT)
PREFILLED_SYRINGE | INTRAVENOUS | Status: AC
  Filled 2018-01-04: qty 10

## 2018-01-04 MED ORDER — PHENYLEPHRINE HCL 10 MG/ML IJ SOLN
INTRAMUSCULAR | Status: DC | PRN
  Administered 2018-01-04 (×5): 80 ug via INTRAVENOUS

## 2018-01-04 MED ORDER — DEXAMETHASONE SODIUM PHOSPHATE 10 MG/ML IJ SOLN
INTRAMUSCULAR | Status: AC
  Filled 2018-01-04: qty 1

## 2018-01-04 MED ORDER — LIDOCAINE HCL (CARDIAC) PF 100 MG/5ML IV SOSY
PREFILLED_SYRINGE | INTRAVENOUS | Status: DC | PRN
  Administered 2018-01-04: 80 mg via INTRAVENOUS

## 2018-01-04 SURGICAL SUPPLY — 22 items
BAG URINE DRAINAGE (UROLOGICAL SUPPLIES) ×2 IMPLANT
CATH FOLEY 2WAY SLVR  5CC 16FR (CATHETERS) ×1
CATH FOLEY 2WAY SLVR 5CC 16FR (CATHETERS) ×1 IMPLANT
CATH ROBINSON RED A/P 20FR (CATHETERS) ×2 IMPLANT
CONT SPECI 4OZ STER CLIK (MISCELLANEOUS) ×4 IMPLANT
COVER BACK TABLE 60X90IN (DRAPES) ×2 IMPLANT
COVER MAYO STAND STRL (DRAPES) ×2 IMPLANT
COVER SURGICAL LIGHT HANDLE (MISCELLANEOUS) ×2 IMPLANT
COVER WAND RF STERILE (DRAPES) IMPLANT
DRSG TEGADERM 4X4.75 (GAUZE/BANDAGES/DRESSINGS) ×4 IMPLANT
DRSG TEGADERM 8X12 (GAUZE/BANDAGES/DRESSINGS) ×2 IMPLANT
GLOVE BIO SURGEON STRL SZ7.5 (GLOVE) ×4 IMPLANT
GOWN STRL REUS W/TWL LRG LVL3 (GOWN DISPOSABLE) ×4 IMPLANT
HOLDER FOLEY CATH W/STRAP (MISCELLANEOUS) IMPLANT
IMPL SPACEOAR SYSTEM 10ML (Spacer) IMPLANT
IMPLANT SPACEOAR SYSTEM 10ML (Spacer) IMPLANT
MARKER SKIN DUAL TIP RULER LAB (MISCELLANEOUS) ×2 IMPLANT
PACK CYSTO (CUSTOM PROCEDURE TRAY) ×2 IMPLANT
SYR 10ML LL (SYRINGE) ×2 IMPLANT
TOWEL OR 17X26 10 PK STRL BLUE (TOWEL DISPOSABLE) ×2 IMPLANT
UNDERPAD 30X30 (UNDERPADS AND DIAPERS) ×2 IMPLANT
radionuclide brachytherapy source ×1 IMPLANT

## 2018-01-04 NOTE — Anesthesia Procedure Notes (Signed)
Procedure Name: LMA Insertion Date/Time: 01/04/2018 8:05 AM Performed by: Jonna Munro, CRNA Pre-anesthesia Checklist: Patient identified, Emergency Drugs available, Suction available, Patient being monitored and Timeout performed Patient Re-evaluated:Patient Re-evaluated prior to induction Oxygen Delivery Method: Circle system utilized Preoxygenation: Pre-oxygenation with 100% oxygen Induction Type: IV induction LMA: LMA inserted LMA Size: 5.0 Number of attempts: 1 Placement Confirmation: positive ETCO2 and breath sounds checked- equal and bilateral Tube secured with: Tape Dental Injury: Teeth and Oropharynx as per pre-operative assessment

## 2018-01-04 NOTE — Anesthesia Postprocedure Evaluation (Signed)
Anesthesia Post Note  Patient: Brandon Frank  Procedure(s) Performed: RADIOACTIVE SEED IMPLANT BRACHYTHERAPY WITH FLEXIBLE CYSTOSCOPOY (N/A )     Patient location during evaluation: PACU Anesthesia Type: General Level of consciousness: sedated Pain management: pain level controlled Vital Signs Assessment: post-procedure vital signs reviewed and stable Respiratory status: spontaneous breathing and respiratory function stable Cardiovascular status: stable Postop Assessment: no apparent nausea or vomiting Anesthetic complications: no    Last Vitals:  Vitals:   01/04/18 1022 01/04/18 1050  BP: 122/72 109/69  Pulse: 95 91  Resp: 16 16  Temp: 37 C 36.5 C  SpO2: 94% 98%    Last Pain:  Vitals:   01/04/18 1050  TempSrc:   PainSc: 0-No pain                 Aurore Redinger DANIEL

## 2018-01-04 NOTE — Op Note (Addendum)
PATIENT:  Brandon Frank  PRE-OPERATIVE DIAGNOSIS:  Adenocarcinoma of the prostate  POST-OPERATIVE DIAGNOSIS:  Same  PROCEDURE:  1. I-125 radioactive seed implantation 2. Cystoscopy    SURGEON:  Surgeon(s): Wendie Simmer, MD  Radiation oncologist: Dr. Tyler Pita  ANESTHESIA:  General  EBL:  Minimal  DRAINS: 75 French Foley catheter  INDICATION: Brandon Frank  Description of procedure: After informed consent the patient was brought to the major OR, placed on the table and administered general anesthesia. He was then moved to the modified lithotomy position with his perineum perpendicular to the floor. His perineum and genitalia were then sterilely prepped. An official timeout was then performed. A 16 French Foley catheter was then placed in the bladder and filled with dilute contrast, a rectal tube was placed in the rectum and the transrectal ultrasound probe was placed in the rectum and affixed to the stand. He was then sterilely draped.  Real time ultrasonography was used along with the seed planning software [employer]state. This was used to develop the seed plan including the number of needles as well as number of seeds required for complete and adequate coverage. Real-time ultrasonography was then used along with the previously developed plan and the Nucletron device to implant a total of 72 seeds using 26 needles. This proceeded without difficulty or complication.   A Foley catheter was then removed as well as the transrectal ultrasound probe and rectal probe. Flexible cystoscopy was then performed using the 17 French flexible scope which revealed a normal urethra throughout its length down to the sphincter which appeared intact. The prostatic urethra revealed mild bilobar hypertrophy but no evidence of obstruction, seeds, spacers or lesions. The bladder was then entered and fully and systematically inspected. The ureteral orifices were noted to be of normal configuration  and position. The mucosa revealed no evidence of tumors. There were also no stones identified within the bladder. I noted no seeds or spacers on the floor of the bladder and retroflexion of the scope revealed no seeds protruding from the base of the prostate.  The cystoscope was then removed and the patient was awakened and taken to recovery room in stable and satisfactory condition. He tolerated procedure well and there were no intraoperative complications.

## 2018-01-04 NOTE — Interval H&P Note (Signed)
History and Physical Interval Note:  01/04/2018 7:34 AM  Brandon Frank  has presented today for surgery, with the diagnosis of PROSTATE CANCER  The various methods of treatment have been discussed with the patient and family. After consideration of risks, benefits and other options for treatment, the patient has consented to  Procedure(s): RADIOACTIVE SEED IMPLANT BRACHYTHERAPY (N/A) as a surgical intervention .  The patient's history has been reviewed, patient examined, no change in status, stable for surgery.  I have reviewed the patient's chart and labs.  Questions were answered to the patient's satisfaction.     Marton Redwood, III

## 2018-01-04 NOTE — Transfer of Care (Signed)
Immediate Anesthesia Transfer of Care Note  Patient: Brandon Frank  Procedure(s) Performed: RADIOACTIVE SEED IMPLANT BRACHYTHERAPY WITH FLEXIBLE CYSTOSCOPOY (N/A )  Patient Location: PACU  Anesthesia Type:General  Level of Consciousness: awake, alert  and oriented  Airway & Oxygen Therapy: Patient Spontanous Breathing and Patient connected to face mask oxygen  Post-op Assessment: Report given to RN and Post -op Vital signs reviewed and stable  Post vital signs: Reviewed and stable  Last Vitals:  Vitals Value Taken Time  BP 130/78 01/04/2018  9:39 AM  Temp    Pulse 96 01/04/2018  9:39 AM  Resp 17 01/04/2018  9:39 AM  SpO2 100 % 01/04/2018  9:39 AM  Vitals shown include unvalidated device data.  Last Pain:  Vitals:   01/04/18 0643  TempSrc: Oral      Patients Stated Pain Goal: 4 (18/29/93 7169)  Complications: No apparent anesthesia complications

## 2018-01-04 NOTE — Progress Notes (Signed)
  Radiation Oncology         [phone] ________________________________  Name: Brandon Frank MRN: [id number]  Date: 01/04/2018  DOB: 11-23-1955       Prostate Seed Implant  CZ:[id number], [name], MD  No ref. provider found  DIAGNOSIS: 62 y.o. gentleman with T1c adenocarcinoma of the prostate with a Gleason score of 3+4 and a PSA of 4.93    ICD-10-CM   1. Prostate cancer ([place]) C61 DG Chest 2 View    DG Chest 2 View    PROCEDURE: Insertion of radioactive I-125 seeds into the prostate gland.  RADIATION DOSE: 145 Gy, definitive therapy.  TECHNIQUE: [name] was brought to the operating room with the urologist. He was placed in the dorsolithotomy position. He was catheterized and a rectal tube was inserted. The perineum was shaved, prepped and draped. The ultrasound probe was then introduced into the rectum to see the prostate gland.  TREATMENT DEVICE: A needle grid was attached to the ultrasound probe stand and anchor needles were placed.  3D PLANNING: The prostate was imaged in 3D using a sagittal sweep of the prostate probe. These images were transferred to the planning computer. There, the prostate, urethra and rectum were defined on each axial reconstructed image. Then, the software created an optimized 3D plan and a few seed positions were adjusted. The quality of the plan was reviewed using [facility] information for the target and the following two organs at risk:  Urethra and Rectum.  Then the accepted plan was printed and handed off to the radiation therapist.  Under my supervision, the custom loading of the seeds and spacers was carried out and loaded into sealed vicryl sleeves.  These pre-loaded needles were then placed into the needle holder.[name]  PROSTATE VOLUME STUDY:  Using transrectal ultrasound the volume of the prostate was verified to be 40.8 cc.  SPECIAL TREATMENT PROCEDURE/SUPERVISION AND HANDLING: The pre-loaded needles were then delivered under sagittal guidance. A  total of 26 needles were used to deposit 72 seeds in the prostate gland. The individual seed activity was 0.463 mCi.  SpaceOAR:  No  COMPLEX SIMULATION: At the end of the procedure, an anterior radiograph of the pelvis was obtained to document seed positioning and count. Cystoscopy was performed to check the urethra and bladder.  MICRODOSIMETRY: At the end of the procedure, the patient was emitting 0.06 mR/hr at 1 meter. Accordingly, he was considered safe for hospital discharge.  PLAN: The patient will return to the radiation oncology clinic for post implant CT dosimetry in three weeks.   ________________________________  [name], M.D.

## 2018-01-04 NOTE — Discharge Instructions (Addendum)
Antibiotics °You may be given a prescription for an antibiotic to take when you arrive home. If so, be sure to take every tablet in the bottle, even if you are feeling better before the prescription is finished. If you begin itching, notice a rash or start to swell on your trunk, arms, legs and/or throat, immediately stop taking the antibiotic and call your Urologist. °Diet °Resume your usual diet when you return home. To keep your bowels moving easily and softly, drink prune, apple and cranberry juice at room temperature. You may also take a stool softener, such as Colace, which is available without prescription at local pharmacies. °Daily activities °? No driving or heavy lifting for at least two days after the implant. °? No bike riding, horseback riding or riding lawn mowers for the first month after the implant. °? Any strenuous physical activity should be approved by your doctor before you resume it. °Sexual relations °You may resume sexual relations two weeks after the procedure. A condom should be used for the first two weeks. Your semen may be dark brown or black; this is normal and is related bleeding that may have occurred during the implant. °Postoperative swelling °Expect swelling and bruising of the scrotum and perineum (the area between the scrotum and anus). Both the swelling and the bruising should resolve in l or 2 weeks. Ice packs and over- the-counter medications such as Tylenol, Advil or Aleve may lessen your discomfort. °Postoperative urination °Most men experience burning on urination and/or urinary frequency. If this becomes bothersome, contact your Urologist.  Medication can be prescribed to relieve these problems.  It is normal to have some blood in your urine for a few days after the implant. °Special instructions related to the seeds °It is unlikely that you will pass an Iodine-125 seed in your urine. The seeds are silver in color and are about as large as a grain of rice. If you pass a  seed, do not handle it with your fingers. Use a spoon to place it in an envelope or jar in place this in base occluded area such as the garage or basement for return to the radiation clinic at your convenience. ° °Contact your doctor for °? Temperature greater than 101 F °? Increasing pain °? Inability to urinate °Follow-up ° You should have follow up with your urologist and radiation oncologist about 3 weeks after the procedure. °General information regarding Iodine seeds °? Iodine-125 is a low energy radioactive material. It is not deeply penetrating and loses energy at short distances. Your prostate will absorb the radiation. Objects that are touched or used by the patient do not become radioactive. °? Body wastes (urine and stool) or body fluids (saliva, tears, semen or blood) are not radioactive. °? The Nuclear Regulatory Commission (NRC) has determined that no radiation precautions are needed for patients undergoing Iodine-125 seed implantation. The NRC states that such patients do not present a risk to the people around them, including young children and pregnant women. However, in keeping with the general principle that radiation exposure should be kept as low reasonably possible, we suggest the following: °? Children and pets should not sit on the patient's lap for the first two (2) weeks after the implant. °? Pregnant (or possibly pregnant) women should avoid prolonged, close contact with the patient for the first two (2) weeks after the implant. °? A distance of three (3) feet is acceptable. °? At a distance of three (3) feet, there is no limit to the   length of time anyone can be with the patient.  General Anesthesia, Adult, Care After These instructions provide you with information about caring for yourself after your procedure. Your health care provider may also give you more specific instructions. Your treatment has been planned according to current medical practices, but problems sometimes occur.  Call your health care provider if you have any problems or questions after your procedure. What can I expect after the procedure? After the procedure, it is common to have:  Vomiting.  A sore throat.  Mental slowness.  It is common to feel:  Nauseous.  Cold or shivery.  Sleepy.  Tired.  Sore or achy, even in parts of your body where you did not have surgery.  Follow these instructions at home: For at least 24 hours after the procedure:  Do not: ? Participate in activities where you could fall or become injured. ? Drive. ? Use heavy machinery. ? Drink alcohol. ? Take sleeping pills or medicines that cause drowsiness. ? Make important decisions or sign legal documents. ? Take care of children on your own.  Rest. Eating and drinking  If you vomit, drink water, juice, or soup when you can drink without vomiting.  Drink enough fluid to keep your urine clear or pale yellow.  Make sure you have little or no nausea before eating solid foods.  Follow the diet recommended by your health care provider. General instructions  Have a responsible adult stay with you until you are awake and alert.  Return to your normal activities as told by your health care provider. Ask your health care provider what activities are safe for you.  Take over-the-counter and prescription medicines only as told by your health care provider.  If you smoke, do not smoke without supervision.  Keep all follow-up visits as told by your health care provider. This is important. Contact a health care provider if:  You continue to have nausea or vomiting at home, and medicines are not helpful.  You cannot drink fluids or start eating again.  You cannot urinate after 8-12 hours.  You develop a skin rash.  You have fever.  You have increasing redness at the site of your procedure. Get help right away if:  You have difficulty breathing.  You have chest pain.  You have unexpected  bleeding.  You feel that you are having a life-threatening or urgent problem. This information is not intended to replace advice given to you by your health care provider. Make sure you discuss any questions you have with your health care provider. Document Released: 04/13/2000 Document Revised: 06/10/2015 Document Reviewed: 12/20/2014 Elsevier Interactive Patient Education  [employer].

## 2018-01-07 ENCOUNTER — Telehealth: Payer: Self-pay | Admitting: Radiation Oncology

## 2018-01-07 NOTE — Telephone Encounter (Signed)
Received voicemail message from patient's wife requesting a return call. Phoned her back. Clarified time and distance guidelines/instructions s/p seed implant. She verbalized understanding and expressed appreciation for the return call.

## 2018-01-13 ENCOUNTER — Other Ambulatory Visit: Payer: Self-pay | Admitting: Family Medicine

## 2018-01-13 ENCOUNTER — Encounter: Payer: Self-pay | Admitting: Cardiology

## 2018-01-13 ENCOUNTER — Telehealth: Payer: Self-pay | Admitting: Cardiology

## 2018-01-13 MED ORDER — METOPROLOL SUCCINATE ER 50 MG PO TB24: ORAL_TABLET | ORAL | 3 refills | Status: DC

## 2018-01-13 NOTE — Telephone Encounter (Signed)
Spoke with the patient, he has a rapid heart rate, 125-140s. He is currently off Eliquis due to Prostate surgery. He was planned to return on Eliquis on 12/20, but due to complications was delayed and had a catheter placed today. He is still off anticoagulation. His blood pressure is 177/98. He is asymptomatic other than his rapid heart rate.

## 2018-01-13 NOTE — Telephone Encounter (Signed)
Duplicate encounter

## 2018-01-13 NOTE — Telephone Encounter (Signed)
Spoke to the patient, he is uncertain if he is in afib and he had another tachycardia event on 12/25. The patient is unable to go to an appointment tomorrow at afib due to severe pain from the surgery. He accepted taking metoprolol 100 mg qam and 50 mg qpm. He will call afib clinic tomorrow to see if he can set up an appointment next week. He has the catheter removed on 12/31 and believes that he will resume anticoagulation after. He is reaching our to urology to clarify. Advised the patient if symptoms worsen to give our office a call or go to the hospital if severe.

## 2018-01-13 NOTE — Telephone Encounter (Signed)
New Message   Pt called and has new important information. Please call back

## 2018-01-13 NOTE — Telephone Encounter (Signed)
Spoke with Brandon Frank from [facility], patient off Eliquis since 12/15, due to restart 12/20 but unable to do so due to bleeding from prostate biopsy.  Urinary catheter placed today so anticoag still not restarted.  Pt out of rhythm from what he knows since about 6 this morning but that is not certain as of now.  Pt unable to dccv if unsure of how long he has been out.   If certain within 48 he may proceed to ED for dccv..  Per Dr. Rayann Heman.  If not certain, he should increase rate control metop succ  To 100mg  in the a.m and 50 mg in the p.m.  Follow up with afib clinic  Friday morn.  Brandon Frank will contact pt and if plan changes due to time out of rhythm, we will push appt out 1 week and proceed with emergent dccv.  Will wait for follow up

## 2018-01-13 NOTE — Telephone Encounter (Signed)
New Message    Patient c/o Palpitations:  High priority if patient c/o lightheadedness, shortness of breath, or chest pain  1) How long have you had palpitations/irregular HR/ Afib? Are you having the symptoms now? All morning since 6am  2) Are you currently experiencing lightheadedness, SOB or CP? Just a little SOB  3) Do you have a history of afib (atrial fibrillation) or irregular heart rhythm? Yes   4) Have you checked your BP or HR? (document readings if available): BP-177/98  Hr-125-145  5) Are you experiencing any other symptoms? No

## 2018-01-13 NOTE — Telephone Encounter (Signed)
Spoke with the patient, he mistakenly took metformin instead of metoprolol for 2 days. The patient just took his medication a few minutes ago. Advised him to monitor his rate and will call tomorrow to see if the medication resolves the problem. Advised the patient to just do his original dose of 100 mg. Sending to Dr. Radford Pax.

## 2018-01-14 ENCOUNTER — Ambulatory Visit (HOSPITAL_COMMUNITY): Payer: BLUE CROSS/BLUE SHIELD | Admitting: Nurse Practitioner

## 2018-01-16 ENCOUNTER — Emergency Department (HOSPITAL_COMMUNITY): Payer: BLUE CROSS/BLUE SHIELD

## 2018-01-16 ENCOUNTER — Encounter (HOSPITAL_COMMUNITY): Payer: Self-pay | Admitting: Emergency Medicine

## 2018-01-16 ENCOUNTER — Emergency Department (HOSPITAL_COMMUNITY)
Admission: EM | Admit: 2018-01-16 | Discharge: 2018-01-16 | Disposition: A | Discharge status: Home / Self Care | Payer: BLUE CROSS/BLUE SHIELD | Attending: Emergency Medicine | Admitting: Emergency Medicine

## 2018-01-16 DIAGNOSIS — R002 Palpitations: Secondary | ICD-10-CM

## 2018-01-16 DIAGNOSIS — Z79899 Other long term (current) drug therapy: Secondary | ICD-10-CM | POA: Insufficient documentation

## 2018-01-16 DIAGNOSIS — R42 Dizziness and giddiness: Secondary | ICD-10-CM | POA: Diagnosis not present

## 2018-01-16 DIAGNOSIS — E119 Type 2 diabetes mellitus without complications: Secondary | ICD-10-CM | POA: Diagnosis not present

## 2018-01-16 DIAGNOSIS — Z8546 Personal history of malignant neoplasm of prostate: Secondary | ICD-10-CM | POA: Insufficient documentation

## 2018-01-16 DIAGNOSIS — I10 Essential (primary) hypertension: Secondary | ICD-10-CM | POA: Insufficient documentation

## 2018-01-16 DIAGNOSIS — Z7984 Long term (current) use of oral hypoglycemic drugs: Secondary | ICD-10-CM | POA: Diagnosis not present

## 2018-01-16 DIAGNOSIS — Z87891 Personal history of nicotine dependence: Secondary | ICD-10-CM | POA: Insufficient documentation

## 2018-01-16 DIAGNOSIS — Z7901 Long term (current) use of anticoagulants: Secondary | ICD-10-CM | POA: Diagnosis not present

## 2018-01-16 DIAGNOSIS — R319 Hematuria, unspecified: Secondary | ICD-10-CM | POA: Diagnosis not present

## 2018-01-16 LAB — POCT I-STAT, CHEM 8
BUN: 13 mg/dL (ref 6–20)
CALCIUM ION: 1.14 mmol/L — AB (ref 1.15–1.40)
Chloride: 101 mmol/L (ref 98–111)
Creatinine, Ser: 1 mg/dL (ref 0.61–1.24)
Glucose, Bld: 185 mg/dL — ABNORMAL HIGH (ref 70–99)
HCT: 47 % (ref 39.0–52.0)
Hemoglobin: 16 g/dL (ref 13.0–17.0)
Potassium: 4.3 mmol/L (ref 3.5–5.1)
Sodium: 134 mmol/L — ABNORMAL LOW (ref 135–145)
TCO2: 22 mmol/L (ref 22–32)

## 2018-01-16 LAB — CBC WITH DIFFERENTIAL/PLATELET
Abs Immature Granulocytes: 0.03 10*3/uL (ref 0.00–0.07)
BASOS ABS: 0.1 10*3/uL (ref 0.0–0.1)
Basophils Relative: 1 %
EOS PCT: 3 %
Eosinophils Absolute: 0.2 10*3/uL (ref 0.0–0.5)
HCT: 48.5 % (ref 39.0–52.0)
Hemoglobin: 16.3 g/dL (ref 13.0–17.0)
Immature Granulocytes: 1 %
Lymphocytes Relative: 22 %
Lymphs Abs: 1.3 10*3/uL (ref 0.7–4.0)
MCH: 31.2 pg (ref 26.0–34.0)
MCHC: 33.6 g/dL (ref 30.0–36.0)
MCV: 92.9 fL (ref 80.0–100.0)
MONO ABS: 0.7 10*3/uL (ref 0.1–1.0)
Monocytes Relative: 12 %
NRBC: 0 % (ref 0.0–0.2)
Neutro Abs: 3.6 10*3/uL (ref 1.7–7.7)
Neutrophils Relative %: 61 %
Platelets: 168 10*3/uL (ref 150–400)
RBC: 5.22 MIL/uL (ref 4.22–5.81)
RDW: 12.4 % (ref 11.5–15.5)
WBC: 5.9 10*3/uL (ref 4.0–10.5)

## 2018-01-16 LAB — COMPREHENSIVE METABOLIC PANEL
ALK PHOS: 64 U/L (ref 38–126)
ALT: 34 U/L (ref 0–44)
AST: 30 U/L (ref 15–41)
Albumin: 4.4 g/dL (ref 3.5–5.0)
Anion gap: 9 (ref 5–15)
BUN: 15 mg/dL (ref 6–20)
CALCIUM: 9.3 mg/dL (ref 8.9–10.3)
CO2: 22 mmol/L (ref 22–32)
Chloride: 103 mmol/L (ref 98–111)
Creatinine, Ser: 1.03 mg/dL (ref 0.61–1.24)
GFR calc Af Amer: >60 mL/min (ref >60)
GFR calc non Af Amer: >60 mL/min (ref >60)
GLUCOSE: 186 mg/dL — AB (ref 70–99)
Potassium: 4.3 mmol/L (ref 3.5–5.1)
Sodium: 134 mmol/L — ABNORMAL LOW (ref 135–145)
TOTAL PROTEIN: 7 g/dL (ref 6.5–8.1)
Total Bilirubin: 0.9 mg/dL (ref 0.3–1.2)

## 2018-01-16 LAB — POCT I-STAT TROPONIN I: TROPONIN I, POC: 0.01 ng/mL (ref 0.00–0.08)

## 2018-01-16 LAB — URINALYSIS, ROUTINE W REFLEX MICROSCOPIC
Bacteria, UA: NONE SEEN
Bilirubin Urine: NEGATIVE
Glucose, UA: >=500 mg/dL — AB
Ketones, ur: 5 mg/dL — AB
Leukocytes, UA: NEGATIVE
Nitrite: NEGATIVE
PROTEIN: NEGATIVE mg/dL
Specific Gravity, Urine: 1.026 (ref 1.005–1.030)
pH: 5 (ref 5.0–8.0)

## 2018-01-16 LAB — TSH: TSH: 2.136 u[IU]/mL (ref 0.350–4.500)

## 2018-01-16 MED ORDER — SODIUM CHLORIDE 0.9 % IV BOLUS
1000.0000 mL | Freq: Once | INTRAVENOUS | Status: AC
  Administered 2018-01-16: 1000 mL via INTRAVENOUS

## 2018-01-16 MED ORDER — METOPROLOL TARTRATE 25 MG PO TABS
50.0000 mg | ORAL_TABLET | Freq: Once | ORAL | Status: AC
  Administered 2018-01-16: 50 mg via ORAL
  Filled 2018-01-16: qty 2

## 2018-01-16 NOTE — ED Triage Notes (Signed)
Pt reports Hx a fib since 10am today. Pt was on Eliquis but was taken off it when had prostate seeds implanted 2 weeks ago. Reports when he would go to stand up today felt like was going to pass out.

## 2018-01-16 NOTE — ED Provider Notes (Signed)
[facility] DEPT Provider Note   CSN: [id number] Arrival date & time: 01/16/18  1846     History   Chief Complaint Chief Complaint  Patient presents with  . Atrial Fibrillation    HPI Brandon Frank is a 62 y.o. male hx of afib off Eliquis, prostate cancer s/p radioactive seed implant, here presenting with dizziness, palpitations.  Patient had radioactive seed implant 2 weeks ago.  He was off of Eliquis and restarted but then had hematuria in his Foley so that was stopped again.  Patient is currently on Bactrim for possible catheter associated UTI.  Patient states that today his heart rate has been very elevated around 130-140.  He was concerned that he went into rapid A. Fib.  He also had some dizziness when he stands up.  Patient denies any chest pain shortness of breath currently.  Patient actually told me that he has not been taking his metoprolol the last several days as he confused with his metformin.  Denies any fevers or chills.  The history is provided by the patient.    Past Medical History:  Diagnosis Date  . Anxiety 02/03/2011  . Atrial fibrillation and flutter ([place]) 07/13/2017  . Bilateral cataracts   . Bulging lumbar disc    5  . Chronic low back pain   . Closed fracture of coccyx ([place])   . Degeneration of lumbar intervertebral disc   . Degenerative lumbar spinal stenosis   . Diarrhea    due to side effects from medications  . Family history of premature CAD 03/20/2015  . GERD (gastroesophageal reflux disease)   . Hereditary and idiopathic peripheral neuropathy 12/06/2006   Qualifier: Diagnosis of  By: [name], [name]    . History of bilateral inguinal hernias    at birth  . History of colon polyps   . Hyperlipidemia   . Hypertension associated with diabetes ([place]) 04/01/2016  . Infection of urinary tract 06/22/2015  . Inguinal hernia    bilateral at birth  . Insomnia 12/06/2006   Qualifier: Diagnosis of  By: [name], [name]      . Lipoma of scalp 07/2003   lipoma of 11 x 35 mm  . Low testosterone 10/07/2015  . Lumbar radiculopathy   . Mobitz type 1 second degree atrioventricular block    a. Event monitor 2017 - 1 beat.  . Nonalcoholic fatty liver disease 11/24/2017   pt unaware  . Numbness in feet    and cold  . OA (osteoarthritis)   . Obstructive sleep apnea 12/06/2013   NPSG 11/14/13- Severe OSA, AHI 86/ hr with loud snore, desat ot 80%, titrated to CPAP 12, weight 215 lbs   . Overweight 10/23/2008   Qualifier: Diagnosis of  By: [name], MD, [name] [name]    . Paroxysmal atrial flutter ([place])   . Plantar fasciitis of right foot 10/21/2012   resolved  . Premature atrial contractions   . Prostate cancer ([place])   . PVC's (premature ventricular contractions)   . Rotator cuff tear 11/2010   Right  . Sigmoid diverticulosis 10/19/2017   Noted on colonoscopy  . Sinus tachycardia 10/27/2013   Persistent mild sinus tachycardia, October, 2015   //   48 hour Holter, October, 2015, rare PACs and PVCs. Heart rate range 70-128, mean heart rate 95 during the daytime, mean heart rate 85 during the resting hours.   //   Patient was diagnosed with severe sleep apnea. C Pap was started and  his resting sinus tachycardia improved greatly.   [name] Spinal stenosis   . TRANSAMINASES, SERUM, ELEVATED 11/30/2005   Qualifier: Diagnosis of  By: [name], [name]    . Type 2 diabetes mellitus, uncontrolled ([place]) 04/14/2012  . URI (upper respiratory infection) 06/22/2015    Patient Active Problem List   Diagnosis Date Noted  . GAD (generalized anxiety disorder) 12/15/2017  . MDD (major depressive disorder) 12/15/2017  . Malignant neoplasm of prostate ([place]) 09/28/2017  . Atrial fibrillation and flutter ([place]) 07/13/2017  . Hypertension associated with diabetes ([place]) 04/01/2016  . Low testosterone 10/07/2015  . Infection of urinary tract 06/22/2015  . URI (upper respiratory infection) 06/22/2015  . Family history of premature CAD  03/20/2015  . Obstructive sleep apnea 12/06/2013  . Sinus tachycardia 10/27/2013  . Ejection fraction   . Plantar fasciitis of right foot 10/21/2012  . Type 2 diabetes mellitus, uncontrolled ([place]) 04/14/2012  . Anxiety 02/03/2011  . Overweight 10/23/2008  . Hereditary and idiopathic peripheral neuropathy 12/06/2006  . Insomnia 12/06/2006  . TRANSAMINASES, SERUM, ELEVATED 11/30/2005  . Hyperlipidemia 11/29/2005    Past Surgical History:  Procedure Laterality Date  . ABLATION     tailbone twice  . BIOPSY  10/19/2017   Procedure: BIOPSY;  Surgeon: Juanita Craver, MD;  Location: [facility] END[facility]COPY;  Service: Endoscopy;;  . COLONOSCOPY WITH PROPOFOL N/A 10/19/2017   Procedure: COLONOSCOPY WITH PROPOFOL;  Surgeon: Juanita Craver, MD;  Location: [facility] ENDOSCOPY;  Service: Endoscopy;  Laterality: N/A;  . epidural steroid injection     Lower back  . HERNIA REPAIR  1963   bilateral inguinal  . NASAL SINUS SURGERY    . POLYPECTOMY  10/19/2017   Procedure: POLYPECTOMY;  Surgeon: Juanita Craver, MD;  Location: [facility] ENDOSCOPY;  Service: Endoscopy;;  . PROSTATE BIOPSY N/A    x2  . rotator cuff surgery Right 2012   Dr. [name]        Home Medications    Prior to Admission medications   Medication Sig Start Date End Date Taking? Authorizing Provider  acetaminophen (TYLENOL) 500 MG tablet Take 1,000 mg by mouth 2 (two) times daily as needed for moderate pain or headache.    [provider]  Alirocumab (PRALUENT) 150 MG/ML SOAJ Inject 150 mg into the skin every 14 (fourteen) days. 12/13/17   Sueanne Margarita, MD  apixaban (ELIQUIS) 5 MG TABS tablet Take 5 mg by mouth 2 (two) times daily.    [provider]  atorvastatin (LIPITOR) 80 MG tablet Take 80 mg by mouth every morning.     [provider]  BAYER MICROLET LANCETS lancets USE   TO CHECK GLUCOSE TWICE DAILY 10/18/17   Burchette, [name], MD  calcium carbonate (TUMS - DOSED IN MG ELEMENTAL CALCIUM) 500 MG chewable tablet Chew 2  tablets by mouth daily as needed for indigestion or heartburn.     [provider]  CONTOUR NEXT TEST test strip USE 1 STRIP TO CHECK GLUCOSE ONCE DAILY 10/06/17   Burchette, [name], MD  cyclobenzaprine (FLEXERIL) 10 MG tablet Take 1 tablet by mouth at bed time as needed for muscle spasms. 07/27/17   Burchette, [name], MD  Dulaglutide (TRULICITY) 3.47 QQ/5.9DG SOPN Inject 0.75 mg into the skin once a week. 07/13/17   Burchette, [name], MD  Dulaglutide (TRULICITY) 1.5 LO/7.5IE SOPN Inject 1.5 mg into the skin every Friday.    [provider]  DULoxetine (CYMBALTA) 60 MG capsule Take 2-3 capsules (120-180 mg total) by  mouth See admin instructions. Alternate taking 180 and 120 mg once daily Per Dr Candis Schatz 3 tabs one day then 2 the next day and so on. 12/29/17   Donnal Moat T, PA-C  ezetimibe (ZETIA) 10 MG tablet TAKE 1 TABLET BY MOUTH ONCE DAILY 01/13/18   Burchette, [name], MD  fluticasone (FLONASE) 50 MCG/ACT nasal spray Place 1 spray into both nostrils daily as needed for allergies or rhinitis.    [provider]  gabapentin (NEURONTIN) 600 MG tablet Take 1 tablet (600 mg total) by mouth 3 (three) times daily. 12/29/17   Donnal Moat T, PA-C  glucose blood test strip One touch verio.  Test once daily. Dx E11.9 07/28/16   [name], MD  JARDIANCE 10 MG TABS tablet TAKE 1 TABLET BY MOUTH ONCE DAILY 10/15/17   Burchette, [name], MD  Lancets [facility] ULTRASOFT) lancets One touch verio lancets.  Test once daily.  Dx e11.9 07/28/16   [name], MD  lisinopril (PRINIVIL,ZESTRIL) 2.5 MG tablet Take 1 tablet (2.5 mg total) by mouth daily. 09/27/17 12/26/17  Dunn, Nedra Hai, PA-C  LORazepam (ATIVAN) 1 MG tablet Take 1 tablet (1 mg total) by mouth 4 (four) times daily as needed for anxiety. 12/29/17   Donnal Moat T, PA-C  metFORMIN (GLUCOPHAGE) 500 MG tablet TAKE 2 TABLETS BY MOUTH TWICE DAILY WITH A MEAL Patient taking differently: Take 1,000 mg by mouth 2 (two)  times daily.  11/23/17   Burchette, [name], MD  metoprolol succinate (TOPROL-XL) 50 MG 24 hr tablet Take 100 mg, 2 tablets, by mouth in the morning, daily and take 50 mg, 1 tablet, by mouth in the evening, daily.Take with a meal. 01/13/18   Allred, Jeneen Rinks, MD  NON FORMULARY Apply 1 application topically daily. CBD paste    [provider]  oxyCODONE (OXY IR/ROXICODONE) 5 MG immediate release tablet Take 5-10 mg by mouth 3 (three) times daily as needed for severe pain.  12/15/17   [provider]  oxyCODONE (ROXICODONE) 5 MG immediate release tablet Take 1 tablet (5 mg total) by mouth every 8 (eight) hours as needed. 01/04/18 01/04/19  Lucas Mallow, MD  Polyethyl Glycol-Propyl Glycol (SYSTANE OP) Place 1 drop into both eyes daily as needed (dryness).    [provider]  tadalafil (CIALIS) 5 MG tablet Take 5 mg by mouth daily.     [provider]  Tetrahydrozoline HCl (VISINE OP) Place 1 drop into both eyes daily as needed (redness/irritation).    [provider]  traMADol (ULTRAM) 50 MG tablet Take 50 mg by mouth 3 (three) times daily as needed for moderate pain.     [provider]  zolpidem (AMBIEN) 10 MG tablet Take 1 tablet (10 mg total) by mouth at bedtime as needed. for sleep 12/29/17   Donnal Moat T, PA-C  paroxetine mesylate (PEXEVA) 20 MG tablet Take 1 tablet (20 mg total) by mouth every morning. 02/03/11 04/13/11  Swords, Darrick Penna, MD    Family History Family History  Problem Relation Age of Onset  . Heart disease Father 89       MI  . Hyperlipidemia Father   . Hypertension Father   . Diabetes Brother   . Hypertension Brother   . Hyperlipidemia Brother   . Diabetes Paternal Grandfather     Social History Social History   Tobacco Use  . Smoking status: Former Smoker    Last attempt to quit: 03/20/1978    Years  since quitting: 39.8  . Smokeless tobacco: Never Used  Substance Use Topics  . Alcohol use: Yes     Alcohol/week: 2.0 standard drinks    Types: 2 Cans of beer per week    Comment: daily  . Drug use: No     Allergies   Patient has no known allergies.   Review of Systems Review of Systems  Cardiovascular: Positive for palpitations.  Neurological: Positive for dizziness.  All other systems reviewed and are negative.    Physical Exam Updated Vital Signs BP 117/79 (BP Location: Right Arm)   Pulse 86   Temp 98.8 F (37.1 C) (Oral)   Resp 17   SpO2 95%   Physical Exam Vitals signs and nursing note reviewed.  Constitutional:      Appearance: Normal appearance.  HENT:     Head: Normocephalic.     Nose: Nose normal.     Mouth/Throat:     Mouth: Mucous membranes are moist.  Eyes:     Extraocular Movements: Extraocular movements intact.     Pupils: Pupils are equal, round, and reactive to light.  Neck:     Musculoskeletal: Normal range of motion.  Cardiovascular:     Rate and Rhythm: Normal rate and regular rhythm.  Pulmonary:     Effort: Pulmonary effort is normal.     Breath sounds: Normal breath sounds.  Abdominal:     General: Abdomen is flat.     Palpations: Abdomen is soft.     Comments: Foley in place with yellow urine, no obvious hematuria   Musculoskeletal: Normal range of motion.  Skin:    General: Skin is warm.     Capillary Refill: Capillary refill takes less than 2 seconds.  Neurological:     General: No focal deficit present.     Mental Status: He is alert and oriented to person, place, and time.  Psychiatric:        Mood and Affect: Mood normal.        Behavior: Behavior normal.      ED Treatments / Results  Labs (all labs ordered are listed, but only abnormal results are displayed) Labs Reviewed  COMPREHENSIVE METABOLIC PANEL - Abnormal; Notable for the following components:      Result Value   Sodium 134 (*)    Glucose, Bld 186 (*)    All other components within normal limits  URINALYSIS, ROUTINE W REFLEX MICROSCOPIC - Abnormal; Notable  for the following components:   Glucose, UA >=500 (*)    Hgb urine dipstick MODERATE (*)    Ketones, ur 5 (*)    All other components within normal limits  POCT I-STAT, CHEM 8 - Abnormal; Notable for the following components:   Sodium 134 (*)    Glucose, Bld 185 (*)    Calcium, Ion 1.14 (*)    All other components within normal limits  URINE CULTURE  CBC WITH DIFFERENTIAL/PLATELET  TSH  I-STAT CHEM 8, ED  I-STAT TROPONIN, ED  POCT I-STAT TROPONIN I    EKG EKG Interpretation  Date/Time:  Sunday January 16 2018 18:52:40 EST Ventricular Rate:  118 PR Interval:    QRS Duration: 83 QT Interval:  302 QTC Calculation: 424 R Axis:   17 Text Interpretation:  Sinus tachycardia Probable left atrial enlargement No significant change since last tracing Confirmed by Wandra Arthurs [phone]) on 01/16/2018 8:43:33 PM   Radiology Dg Chest 2 View  Result Date: 01/16/2018 CLINICAL DATA:  Near syncopal when standing  up today. EXAM: CHEST - 2 VIEW COMPARISON:  07/26/2017 FINDINGS: The heart size and mediastinal contours are within normal limits. Tiny surgical clip projects over the medial left hemithorax anteriorly. Both lungs are clear. The visualized skeletal structures are unremarkable. IMPRESSION: No active cardiopulmonary disease. Electronically Signed   By: Ashley Royalty M.D.   On: 01/16/2018 19:32    Procedures Procedures (including critical care time)  Medications Ordered in ED Medications  sodium chloride 0.9 % bolus 1,000 mL (1,000 mLs Intravenous New Bag/Given 01/16/18 2106)  metoprolol tartrate (LOPRESSOR) tablet 50 mg (50 mg Oral Given 01/16/18 2107)     Initial Impression / Assessment and Plan / ED Course  I have reviewed the triage vital signs and the nursing notes.  Pertinent labs & imaging results that were available during my care of the patient were reviewed by me and considered in my medical decision making (see chart for details).    Brandon Frank is a 62 y.o. male  here with dizziness, palpitations. Patient concerned that he may be in afib. Initial EKG showed sinus tachycardia. He hasn't been taking his metoprolol for several days and I think likely had reflex tachycardia. Will check electrolytes, get orthostatics.   10:26 PM  Labs and TSH unremarkable. UA showed some hematuria still. CXR clear. Not orthostatic. Given metoprolol and HR remained in 80s. Still hold eliquis given his persistent hematuria. He has follow up with urology in 2 days and I told him to discuss that with urologist    Final Clinical Impressions(s) / ED Diagnoses   Final diagnoses:  None    ED Discharge Orders    None       Drenda Freeze, MD 01/16/18 2227

## 2018-01-16 NOTE — Discharge Instructions (Signed)
Take your metoprolol as prescribed.   Hold off on eliquis for now. See your urologist as scheduled on Tuesday and discuss that with him   Return to ER if you have worse palpitations, chest pain, trouble breathing

## 2018-01-18 LAB — URINE CULTURE: Culture: NO GROWTH

## 2018-01-26 ENCOUNTER — Ambulatory Visit: Payer: Self-pay | Admitting: Urology

## 2018-01-26 ENCOUNTER — Encounter (HOSPITAL_COMMUNITY): Payer: Self-pay | Admitting: Urology

## 2018-01-26 ENCOUNTER — Ambulatory Visit: Payer: BLUE CROSS/BLUE SHIELD | Admitting: Radiation Oncology

## 2018-01-31 ENCOUNTER — Ambulatory Visit: Payer: BLUE CROSS/BLUE SHIELD | Admitting: Internal Medicine

## 2018-02-02 ENCOUNTER — Telehealth: Payer: Self-pay | Admitting: *Deleted

## 2018-02-02 NOTE — Telephone Encounter (Signed)
CALLED PATIENT TO INFORM OF POST SEED APPTS. AND HIS MRI FOR 02-09-18 , SPOKE WITH PATIENT AND HE IS AWARE OF THESE APPTS.

## 2018-02-07 ENCOUNTER — Ambulatory Visit: Payer: BLUE CROSS/BLUE SHIELD | Admitting: Physician Assistant

## 2018-02-08 ENCOUNTER — Telehealth: Payer: Self-pay | Admitting: *Deleted

## 2018-02-08 ENCOUNTER — Encounter (HOSPITAL_COMMUNITY): Payer: Self-pay

## 2018-02-08 NOTE — Telephone Encounter (Signed)
Called patient to remind of post seed appts. and MRI, spoke with patient and he is aware of these appts.

## 2018-02-09 ENCOUNTER — Ambulatory Visit
Admission: RE | Admit: 2018-02-09 | Discharge: 2018-02-09 | Disposition: A | Discharge status: Home / Self Care | Payer: BLUE CROSS/BLUE SHIELD | Source: Ambulatory Visit | Attending: Radiation Oncology | Admitting: Radiation Oncology

## 2018-02-09 ENCOUNTER — Other Ambulatory Visit: Payer: Self-pay

## 2018-02-09 ENCOUNTER — Ambulatory Visit (HOSPITAL_COMMUNITY): Payer: BLUE CROSS/BLUE SHIELD

## 2018-02-09 ENCOUNTER — Telehealth: Payer: Self-pay | Admitting: *Deleted

## 2018-02-09 ENCOUNTER — Encounter: Payer: Self-pay | Admitting: Urology

## 2018-02-09 ENCOUNTER — Ambulatory Visit
Admission: RE | Admit: 2018-02-09 | Discharge: 2018-02-09 | Disposition: A | Discharge status: Home / Self Care | Payer: BLUE CROSS/BLUE SHIELD | Source: Ambulatory Visit | Attending: Urology | Admitting: Urology

## 2018-02-09 VITALS — BP 125/83 | HR 97 | Temp 98.6°F | Resp 20 | Ht 70.0 in | Wt 205.0 lb

## 2018-02-09 DIAGNOSIS — Z923 Personal history of irradiation: Secondary | ICD-10-CM | POA: Insufficient documentation

## 2018-02-09 DIAGNOSIS — C61 Malignant neoplasm of prostate: Secondary | ICD-10-CM | POA: Diagnosis present

## 2018-02-09 DIAGNOSIS — Z7984 Long term (current) use of oral hypoglycemic drugs: Secondary | ICD-10-CM | POA: Diagnosis not present

## 2018-02-09 DIAGNOSIS — Z7901 Long term (current) use of anticoagulants: Secondary | ICD-10-CM | POA: Insufficient documentation

## 2018-02-09 DIAGNOSIS — Z79899 Other long term (current) drug therapy: Secondary | ICD-10-CM | POA: Diagnosis not present

## 2018-02-09 NOTE — Progress Notes (Signed)
Radiation Oncology         [phone] ________________________________  Name: Brandon Frank MRN: [id number]  Date: 02/09/2018  DOB: [birth year]  Post-Seed Follow-Up Visit Note  CC: [name], MD  [name], MD  Diagnosis:   63 y.o. gentleman with T1c adenocarcinoma of the prostate with a Gleason score of 3+4 and a PSA of 4.93    ICD-10-CM   1. Malignant neoplasm of prostate ([place]) C61     Interval Since Last Radiation:  5 weeks 01/04/18:  Insertion of radioactive I-125 seeds into the prostate gland; 145 Gy, definitive therapy.  Narrative:  The patient returns today for routine follow-up.  Unfortunately, his postoperative course was complicated by gross hematuria, likely secondary to Eliquis which he takes for his history of A. fib.  He ended up having to have a catheter placed in the urology office on 01/13/2018 to relieve AUR,  The catheter was able to be removed on January 25, 2018 and he has not had any further issues with gross hematuria or difficulty emptying his bladder since that time.  He continues to take Flomax daily.  He reports only mild residual increased urinary frequency and urinary hesitation symptoms, much improved over the past 2 weeks. He filled out a questionnaire regarding urinary function today providing and overall IPSS score of 15 characterizing his symptoms as moderate and close to his baseline.  His pre-implant score was 14. He denies any abdominal pain, N/V, diarrhea or constipation at this point.  He has a good appetite and is maintaining his weight.  Overall, he is quite pleased with his progress to date.  He was recently seen in consult with Dr. Alona Bene and started on a low-dose TRT.  He does report feeling better since starting this with improved energy, improved mood and increased libido.  He has a follow-up visit scheduled with Dr. Amalia Hailey in April 2020 and plans to continue his urologic care with Dr. Amalia Hailey going forward.  ALLERGIES:  has  No Known Allergies.  Meds: Current Outpatient Medications  Medication Sig Dispense Refill  . acetaminophen (TYLENOL) 500 MG tablet Take 1,000 mg by mouth 2 (two) times daily as needed for moderate pain or headache.    . Alirocumab (PRALUENT) 150 MG/ML SOAJ Inject 150 mg into the skin every 14 (fourteen) days. 2 pen 11  . apixaban (ELIQUIS) 5 MG TABS tablet Take 5 mg by mouth 2 (two) times daily.    [name] atorvastatin (LIPITOR) 80 MG tablet Take 80 mg by mouth every morning.     [name] BAYER MICROLET LANCETS lancets USE   TO CHECK GLUCOSE TWICE DAILY 100 each 5  . calcium carbonate (TUMS - DOSED IN MG ELEMENTAL CALCIUM) 500 MG chewable tablet Chew 2 tablets by mouth daily as needed for indigestion or heartburn.     . CONTOUR NEXT TEST test strip USE 1 STRIP TO CHECK GLUCOSE ONCE DAILY 50 each 25  . cyclobenzaprine (FLEXERIL) 10 MG tablet Take 1 tablet by mouth at bed time as needed for muscle spasms. 30 tablet 0  . Dulaglutide (TRULICITY) 5.18 AC/1.6SA SOPN Inject 0.75 mg into the skin once a week. 2 mL 11  . Dulaglutide (TRULICITY) 1.5 YT/0.1SW SOPN Inject 1.5 mg into the skin every Friday.    . DULoxetine (CYMBALTA) 60 MG capsule Take 2-3 capsules (120-180 mg total) by mouth See admin instructions. Alternate taking 180 and 120 mg once daily Per Dr Candis Schatz 3 tabs one day then 2 the next  day and so on. 225 capsule 1  . ezetimibe (ZETIA) 10 MG tablet TAKE 1 TABLET BY MOUTH ONCE DAILY 90 tablet 0  . fluticasone (FLONASE) 50 MCG/ACT nasal spray Place 1 spray into both nostrils daily as needed for allergies or rhinitis.    [name] gabapentin (NEURONTIN) 600 MG tablet Take 1 tablet (600 mg total) by mouth 3 (three) times daily. 270 tablet 1  . glucose blood test strip One touch verio.  Test once daily. Dx E11.9 100 each 12  . JARDIANCE 10 MG TABS tablet TAKE 1 TABLET BY MOUTH ONCE DAILY 90 tablet 3  . Lancets (ONETOUCH ULTRASOFT) lancets One touch verio lancets.  Test once daily.  Dx e11.9 100 each 12  .  lisinopril (PRINIVIL,ZESTRIL) 2.5 MG tablet Take 1 tablet (2.5 mg total) by mouth daily. 90 tablet 3  . LORazepam (ATIVAN) 1 MG tablet Take 1 tablet (1 mg total) by mouth 4 (four) times daily as needed for anxiety. 360 tablet 1  . metFORMIN (GLUCOPHAGE) 500 MG tablet TAKE 2 TABLETS BY MOUTH TWICE DAILY WITH A MEAL (Patient taking differently: Take 1,000 mg by mouth 2 (two) times daily. ) 360 tablet 0  . metoprolol succinate (TOPROL-XL) 50 MG 24 hr tablet Take 100 mg, 2 tablets, by mouth in the morning, daily and take 50 mg, 1 tablet, by mouth in the evening, daily.Take with a meal. 365 tablet 3  . NON FORMULARY Apply 1 application topically daily. CBD paste    . oxyCODONE (OXY IR/ROXICODONE) 5 MG immediate release tablet Take 5-10 mg by mouth 3 (three) times daily as needed for severe pain.   0  . oxyCODONE (ROXICODONE) 5 MG immediate release tablet Take 1 tablet (5 mg total) by mouth every 8 (eight) hours as needed. 8 tablet 0  . Polyethyl Glycol-Propyl Glycol (SYSTANE OP) Place 1 drop into both eyes daily as needed (dryness).    . tadalafil (CIALIS) 5 MG tablet Take 5 mg by mouth daily.     . Tetrahydrozoline HCl (VISINE OP) Place 1 drop into both eyes daily as needed (redness/irritation).    . traMADol (ULTRAM) 50 MG tablet Take 50 mg by mouth 3 (three) times daily as needed for moderate pain.     [name] zolpidem (AMBIEN) 10 MG tablet Take 1 tablet (10 mg total) by mouth at bedtime as needed. for sleep 90 tablet 1   No current facility-administered medications for this visit.     Physical Findings: In general this is a well appearing Caucasian male in no acute distress.  He's alert and oriented x4 and appropriate throughout the examination. Cardiopulmonary assessment is negative for acute distress and he exhibits normal effort.   Lab Findings: Lab Results  Component Value Date   WBC 5.9 01/16/2018   HGB 16.0 01/16/2018   HCT 47.0 01/16/2018   MCV 92.9 01/16/2018   PLT 168 01/16/2018     Radiographic Findings:  Patient underwent CT imaging in our clinic for post implant dosimetry. The CT will be reviewed by Dr. Tammi Klippel to confirm an adequate distribution of radioactive seeds throughout the prostate gland and ensure that there are no seeds in or near the rectum. We suspect the final radiation plan and dosimetry will show appropriate coverage of the prostate gland.   Impression/Plan: 63 y.o. gentleman with T1c adenocarcinoma of the prostate with a Gleason score of 3+4 and a PSA of 4.93. The patient is recovering from the effects of radiation. His urinary symptoms should gradually improve over  the next 4-6 months. We talked about this today. He is encouraged by his improvement already and is otherwise pleased with his outcome. We also talked about long-term follow-up for prostate cancer following seed implant. He understands that ongoing PSA determinations and digital rectal exams will help perform surveillance to rule out disease recurrence. He has a follow up appointment scheduled with Dr. Amalia Hailey in 04/2018. He understands what to expect with his PSA measures.  He has resumed a low-dose TRT under the care and direction of Dr. Amalia Hailey.  He understands the potential risks associated with using TRT in the setting of active prostate cancer, including but not limited to a potential decrease in effectiveness of treatment and increased risk of prostate cancer recurrence and he is willing to accept those risks for the significant improvement in his quality of life which he appreciates while on testosterone replacement.  Patient was also educated today about some of the long-term effects from radiation including a small risk for rectal bleeding and possibly erectile dysfunction. We talked about some of the general management approaches to these potential complications. However, I did encourage the patient to contact our office or return at any point if he has questions or concerns related to his  previous radiation and prostate cancer.  I greatly enjoyed participating in the care of this very nice gentleman and look forward to following his progress via correspondence.     Nicholos Johns, PA-C

## 2018-02-09 NOTE — Telephone Encounter (Signed)
CALLED PATIENT TO INFORM THAT HE DOESN'T NEED MRI DUE TO NOT HAVING SPACE OAR, SPOKE WITH PATIENT AND HE VERIFIED UNDERSTANDING THIS

## 2018-02-11 ENCOUNTER — Other Ambulatory Visit: Payer: Self-pay | Admitting: *Deleted

## 2018-02-12 NOTE — Progress Notes (Signed)
  Radiation Oncology         [phone] ________________________________  Name: Brandon Frank MRN: [id number]  Date: 02/09/2018  DOB: 1955/08/01  COMPLEX SIMULATION NOTE  NARRATIVE:  The patient was brought to the [facility] today following prostate seed implantation approximately one month ago.  Identity was confirmed.  All relevant records and images related to the planned course of therapy were reviewed.  Then, the patient was set-up supine.  CT images were obtained.  The CT images were loaded into the planning software.  Then the prostate and rectum were contoured.  Treatment planning then occurred.  The implanted iodine 125 seeds were identified by the physics staff for projection of radiation distribution  I have requested : 3D Simulation  I have requested a DVH of the following structures: Prostate and rectum.    ________________________________  [name], M.D.

## 2018-02-17 ENCOUNTER — Encounter: Payer: Self-pay | Admitting: Radiation Oncology

## 2018-02-17 ENCOUNTER — Other Ambulatory Visit: Payer: Self-pay | Admitting: *Deleted

## 2018-02-17 DIAGNOSIS — C61 Malignant neoplasm of prostate: Secondary | ICD-10-CM | POA: Diagnosis not present

## 2018-02-17 MED ORDER — ALIROCUMAB 150 MG/ML ~~LOC~~ SOAJ: 150.0000 mg | SUBCUTANEOUS | 11 refills | Status: DC

## 2018-02-18 ENCOUNTER — Telehealth: Payer: Self-pay

## 2018-02-18 NOTE — Telephone Encounter (Signed)
Pt called stating that they needed a sample I told the pt that I would have a sample at the front desk waiting for him after noon

## 2018-02-22 ENCOUNTER — Other Ambulatory Visit: Payer: Self-pay | Admitting: Family Medicine

## 2018-02-22 ENCOUNTER — Other Ambulatory Visit: Payer: Self-pay | Admitting: Physician Assistant

## 2018-02-22 NOTE — Telephone Encounter (Signed)
Needs to schedule follow up;

## 2018-02-24 NOTE — Progress Notes (Signed)
  Radiation Oncology         [phone] ________________________________  Name: Brandon Frank MRN: [id number]  Date: 02/17/2018  DOB: 1955/10/27  3D Planning Note   Prostate Brachytherapy Post-Implant Dosimetry  Diagnosis: 63 y.o. gentleman with T1c adenocarcinoma of the prostate with a Gleason score of 3+4 and a PSA of 4.93   Narrative: On a previous date, Brandon Frank returned following prostate seed implantation for post implant planning. He underwent CT scan complex simulation to delineate the three-dimensional structures of the pelvis and demonstrate the radiation distribution.  Since that time, the seed localization, and complex isodose planning with dose volume histograms have now been completed.  Results:   Prostate Coverage - The dose of radiation delivered to the 90% or more of the prostate gland (D90) was 106.5% of the prescription dose. This exceeds our goal of greater than 90%. Rectal Sparing - The volume of rectal tissue receiving the prescription dose or higher was 0.05 cc. This falls under our thresholds tolerance of 1.0 cc.  Impression: The prostate seed implant appears to show adequate target coverage and appropriate rectal sparing.  Plan:  The patient will continue to follow with urology for ongoing PSA determinations. I would anticipate a high likelihood for local tumor control with minimal risk for rectal morbidity.  ________________________________  [name], M.D.

## 2018-02-28 ENCOUNTER — Telehealth: Payer: Self-pay | Admitting: Pharmacist

## 2018-02-28 ENCOUNTER — Telehealth: Payer: Self-pay | Admitting: *Deleted

## 2018-02-28 NOTE — Telephone Encounter (Signed)
Pt takes Eliquis for afib with CHADS2VASc score of 3 (DM, HTN, CAD). Renal function is normal. Patient should not need 10 days off of Eliquis for procedure. Recommend holding 3 days prior to spinal procedure and resuming within 2-3 days after if possible.

## 2018-02-28 NOTE — Telephone Encounter (Signed)
   [facility] Pre-operative Risk Assessment    Request for surgical clearance:  1. What type of surgery is being performed? PLIF L4-L5   2. When is this surgery scheduled? 03/09/18   3. What type of clearance is required (medical clearance vs. Pharmacy clearance to hold med vs. Both)? BOTH  4. Are there any medications that need to be held prior to surgery and how long?ELIQUIS HOLD 5 DAYS PRIOR AND RESUME 5 DAYS POST OP  5. Practice name and name of physician performing surgery? [facility]    6. What is your office phone number [phone]    7.   What is your office fax number [phone]  8.   Anesthesia type (None, local, MAC, general) ? GENERAL; AN EKG WAS SENT OVER AS WELL FROM SURGEON'S OFFICE. I WILL HAVE THIS SCANNED INTO THE CHART.   Julaine Hua 02/28/2018, 12:37 PM  _________________________________________________________________   (provider comments below)

## 2018-02-28 NOTE — Telephone Encounter (Signed)
Left voicemail for patient to return phone call. Called and spoke with pharmacy [employer]. Claim for Praluent is linking to wrong PA and therefore is being denied. They are working on getting it fixed and should be able to process claim by 5pm tonight

## 2018-03-01 NOTE — Telephone Encounter (Signed)
Patient made aware. Appreciative of our help

## 2018-03-02 ENCOUNTER — Other Ambulatory Visit: Payer: Self-pay | Admitting: Cardiology

## 2018-03-02 NOTE — Telephone Encounter (Signed)
   Primary Cardiologist: Fransico Him, MD  Chart reviewed as part of pre-operative protocol coverage. Patient was contacted 03/02/2018 in reference to pre-operative risk assessment for pending surgery as outlined below.  Brandon Frank was last seen on 09/27/2017 by Melina Copa PA-C.  Since that day, Brandon Frank has done well without chest pain or shortness of breath.  Therefore, based on ACC/AHA guidelines, the patient would be at acceptable risk for the planned procedure without further cardiovascular testing.   I will route this recommendation to the requesting party via Epic fax function and remove from pre-op pool.  Please call with questions. Per our clinical pharmacist, recommend holding 3 days prior to spinal procedure and resuming within 2-3 days after if possible.  [place], [place] 03/02/2018, 10:49 AM

## 2018-03-02 NOTE — Telephone Encounter (Signed)
I have called [facility] and spoke to [employer]. Surgeon cannot proceed with surgery unless the Eliquis is held 5 days before and 5 days after. Our recommendation is to hold eliquis for 3 days before and 2-3 days after to minimize the chance of stroke. Recent EKG reviewed which showed sinus rhythm without recurrent atrial fibrillation. We will defer the decision to hold 5 days before and after to the discretion of surgeon with understanding to both the surgeon and patient that doing so will increase the chance of stroke during perioperative period. Will also forward to Dr. Radford Pax for review as well.

## 2018-03-02 NOTE — Telephone Encounter (Signed)
Note routed to [employer] via [place].

## 2018-03-02 NOTE — Telephone Encounter (Signed)
  Needs clarification on the stopping of his medication.

## 2018-03-03 NOTE — Telephone Encounter (Signed)
He has not had an CVA and has been maintaining NSR so ok to hold Eliquis for 5 days prior and after for surgery

## 2018-03-03 NOTE — Telephone Encounter (Signed)
Called and in formed patient of holding Eliquis for 5 days before and 5 days after. Patient verbalized understanding and all (if any) were answered.

## 2018-03-03 NOTE — Telephone Encounter (Signed)
° ° °  Danielle from [employer] for update/ status on clearance . Please call [phone] ext [phone]

## 2018-03-04 ENCOUNTER — Encounter: Payer: Self-pay | Admitting: Family Medicine

## 2018-03-09 ENCOUNTER — Ambulatory Visit: Payer: BLUE CROSS/BLUE SHIELD | Attending: Urology

## 2018-03-09 DIAGNOSIS — C61 Malignant neoplasm of prostate: Secondary | ICD-10-CM | POA: Insufficient documentation

## 2018-03-17 MED ORDER — METOPROLOL SUCCINATE ER 100 MG PO TB24: 100.0000 mg | ORAL_TABLET | Freq: Every day | ORAL | 3 refills | Status: DC

## 2018-03-17 NOTE — Telephone Encounter (Signed)
Please call pt. He is supposed to be on 100mg  daily. We previously increased this due to breakthrough atrial fib.   On 12/26 he called in with recurrent AF and original plan was to increase metoprolol to 100mg  QAM/50mg  QAPM per phone note, but the patient then relayed he'd accidentally forgotten metoprolol so was advised just to continue 100mg  daily.   At last OV in Epic 1/22 pulse was 97 so I would recommend 100mg  daily as long as he hasn't had any low BP or HR readings recently. Can you relay to patient and update in Epic based on your conversation with him confirming no issues with slow HR or low BP?  Jayvan Mcshan PA-C

## 2018-03-17 NOTE — Telephone Encounter (Signed)
Pt states that his bp has been fine.  He will start back at Metoprolol 100 mg daily.

## 2018-04-16 ENCOUNTER — Other Ambulatory Visit: Payer: Self-pay | Admitting: Family Medicine

## 2018-04-18 NOTE — Telephone Encounter (Signed)
Yes. He had to switch practices because of his insurance.

## 2018-04-18 NOTE — Telephone Encounter (Signed)
Does this need to be filled by Dr. Terrill Mohr now? Just want to make sure before I refuse according to the MyChart message.

## 2018-04-19 ENCOUNTER — Telehealth: Payer: Self-pay | Admitting: Cardiology

## 2018-04-19 NOTE — Telephone Encounter (Signed)
Caesar Bookman Resident at [facility],  called to let you know patient is currently in the hospital with a spinal infection they are going to start him on rifampin, in addition to two other anabiotics. Patient is on Eliquis the issue is that rifampin will change how eliquis will work.  The plan is to stop eliquis tonight and start him on lovenox.  If you have any recommendations or issues you can call [phone], that is the team [facility].

## 2018-04-19 NOTE — Telephone Encounter (Signed)
Cannot use Xarelto either.  Lovenox is probably best option

## 2018-04-19 NOTE — Telephone Encounter (Signed)
Please find out from [facility] if Xarelto ok to use with Rifampin

## 2018-04-26 ENCOUNTER — Telehealth: Payer: Self-pay | Admitting: Radiation Oncology

## 2018-04-26 NOTE — Telephone Encounter (Signed)
Received voicemail message from patient inquire about MRI results and process to obtain medical records. Returned patient's call. No answer. Left voicemail message with my direct number. Awaiting return call.

## 2018-05-05 ENCOUNTER — Telehealth: Payer: Self-pay

## 2018-05-05 NOTE — Telephone Encounter (Signed)
Returned pt's call. Pt very upset that he can not view MRI results from 02/09/18. Pt adamant that he has had complications since seed placement. Pt upset that he "has been sent on a paper chase and I shouldn't have had to do my own paper chase".  Pt upset that he has changed insurances in the midst of his treatment. Pt reports he has spoken to billing. Pt does not want his new physician "to have to do a paper chase that is not their problem". Pt repeated several times that he "just wants to speak with someone who knows my case instead of someone who doesn't". Conveyed to pt that this RN would alert Ashlyn that he requests call from her to discuss above concerns. Loma Sousa, RN BSN

## 2018-05-06 ENCOUNTER — Telehealth: Payer: Self-pay | Admitting: Urology

## 2018-05-06 NOTE — Telephone Encounter (Signed)
I have a lengthy conversation with Mr. Timmons this morning regarding multiple concerns he had following his recent seed implant. He was concerned that no one seemed to be able find any evidence of the post-seed CT scan performed on 02/09/18 despite having spoken with several staff in our department as well as his Urologist, Dr. Alona Bene.  I explained the utility of this scan and how this is reported which is indeed very different from traditional diagnostic imaging.  I reassured him that I am able to see clear documentation within Epic from the day of the scan as well as the Post-Implant Dosimetry report produced as a result.  As requested, I have forwarded a copy of those reports to Dr. Amalia Hailey for inclusion in his [facility] office records. Mr. Lindon was quite grateful. We also discussed his multiple episodes of AUR which have occurred since the time of his procedure, more recently most likely secondary to an elective back surgery for placement of screws in his lower lumbar spine in 02/2018. He has subsequently developed osteomyelitis at the surgical site requiring hospitalization for 1 week of IV antibiotics prior to recent discharge home to continue IV abx for 1 month.  Shortly after his d/c home, he again developed AUR and will now have an indwelling FC in place for the next month which he is not pleased with but understands the necessity and has provided relief. He also had additional questions regarding billing/insurance so I directed him to [employer] to help sort that out.  At the conclusion of our conversation, the patient reported that all of his questions/concerns had been addressed to his satisfaction and he was grateful for the information provided.  I advised him to call anytime with further questions or concerns related to his precious brachytherapy and wished him all the best going forward.  Nicholos Johns, MMS, PA-C [facility] [place]: [phone]  Fax: [phone]

## 2018-05-22 ENCOUNTER — Other Ambulatory Visit: Payer: Self-pay | Admitting: Physician Assistant

## 2018-05-23 NOTE — Telephone Encounter (Signed)
Last visit 12/2017, didn't make 6 week follow up. Nothing scheduled.

## 2018-06-29 ENCOUNTER — Other Ambulatory Visit: Payer: Self-pay | Admitting: Physician Assistant

## 2018-06-29 NOTE — Telephone Encounter (Signed)
Prescription refill request for Eliquis received.  Last office visit: [place] (09-27-2017) Scr: 1.03 (01-16-2018) Age: 63 yrs old Weight: 96.9 kg  Prescription refill sent.

## 2018-08-31 ENCOUNTER — Other Ambulatory Visit: Payer: Self-pay | Admitting: Physician Assistant

## 2018-08-31 NOTE — Telephone Encounter (Signed)
Sarah please call him and make appt.  Once he makes the appt, I'll send in the RF.  If he doesn't keep the appt then no further RF.

## 2018-08-31 NOTE — Telephone Encounter (Signed)
Patient hasn't been seen since 12/2017

## 2018-08-31 NOTE — Telephone Encounter (Signed)
He is out of network and has sent prescription somewhere else . So do not fill it . He is going to a different provider

## 2018-09-19 ENCOUNTER — Other Ambulatory Visit: Payer: Self-pay | Admitting: Physician Assistant

## 2018-09-26 ENCOUNTER — Other Ambulatory Visit: Payer: Self-pay | Admitting: Physician Assistant

## 2018-09-27 ENCOUNTER — Other Ambulatory Visit: Payer: Self-pay | Admitting: Physician Assistant

## 2018-10-24 ENCOUNTER — Other Ambulatory Visit: Payer: Self-pay | Admitting: Physician Assistant

## 2018-11-28 ENCOUNTER — Other Ambulatory Visit: Payer: Self-pay | Admitting: Physician Assistant

## 2019-03-10 ENCOUNTER — Other Ambulatory Visit: Payer: Self-pay | Admitting: Internal Medicine

## 2019-03-10 MED ORDER — METOPROLOL SUCCINATE ER 100 MG PO TB24: 100.0000 mg | ORAL_TABLET | Freq: Every day | ORAL | 0 refills | Status: DC

## 2019-12-18 ENCOUNTER — Telehealth: Payer: Self-pay

## 2019-12-18 MED ORDER — REPATHA SURECLICK 140 MG/ML ~~LOC~~ SOAJ: 140.0000 mg | SUBCUTANEOUS | 11 refills | Status: DC

## 2019-12-18 NOTE — Telephone Encounter (Signed)
CALLED [facility] PT FOR DRUG CHANGE TO REPATHA AND WORKING ON A NEW PA

## 2019-12-18 NOTE — Telephone Encounter (Signed)
Called and lmomed the pt that we sent rx, and call us to get signed up for copay card

## 2019-12-18 NOTE — Addendum Note (Signed)
Addended by: Allean Found on: 12/18/2019 03:26 PM   Modules accepted: Orders

## 2020-06-12 ENCOUNTER — Ambulatory Visit: Payer: BLUE CROSS/BLUE SHIELD | Admitting: Cardiology

## 2020-06-19 ENCOUNTER — Other Ambulatory Visit: Payer: Self-pay

## 2020-06-19 MED ORDER — METOPROLOL SUCCINATE ER 100 MG PO TB24: 100.0000 mg | ORAL_TABLET | Freq: Every day | ORAL | 0 refills | Status: DC

## 2020-06-25 ENCOUNTER — Ambulatory Visit (INDEPENDENT_AMBULATORY_CARE_PROVIDER_SITE_OTHER): Payer: Medicare Other | Admitting: Cardiology

## 2020-06-25 ENCOUNTER — Encounter: Payer: Self-pay | Admitting: *Deleted

## 2020-06-25 ENCOUNTER — Other Ambulatory Visit: Payer: Self-pay

## 2020-06-25 VITALS — BP 118/76 | HR 76 | Ht 70.0 in | Wt 212.0 lb

## 2020-06-25 DIAGNOSIS — E782 Mixed hyperlipidemia: Secondary | ICD-10-CM

## 2020-06-25 DIAGNOSIS — I480 Paroxysmal atrial fibrillation: Secondary | ICD-10-CM | POA: Diagnosis not present

## 2020-06-25 DIAGNOSIS — E1159 Type 2 diabetes mellitus with other circulatory complications: Secondary | ICD-10-CM

## 2020-06-25 DIAGNOSIS — I152 Hypertension secondary to endocrine disorders: Secondary | ICD-10-CM

## 2020-06-25 DIAGNOSIS — E7800 Pure hypercholesterolemia, unspecified: Secondary | ICD-10-CM

## 2020-06-25 DIAGNOSIS — I2510 Atherosclerotic heart disease of native coronary artery without angina pectoris: Secondary | ICD-10-CM | POA: Diagnosis not present

## 2020-06-25 LAB — LIPID PANEL
Chol/HDL Ratio: 4.9 ratio (ref 0.0–5.0)
Cholesterol, Total: 176 mg/dL (ref 100–199)
HDL: 36 mg/dL — ABNORMAL LOW (ref >39)
LDL Chol Calc (NIH): 114 mg/dL — ABNORMAL HIGH (ref 0–99)
Triglycerides: 144 mg/dL (ref 0–149)
VLDL Cholesterol Cal: 26 mg/dL (ref 5–40)

## 2020-06-25 MED ORDER — ATORVASTATIN CALCIUM 80 MG PO TABS: 1.0000 | ORAL_TABLET | Freq: Every day | ORAL | 3 refills | Status: DC

## 2020-06-25 MED ORDER — METOPROLOL SUCCINATE ER 100 MG PO TB24: 100.0000 mg | ORAL_TABLET | Freq: Every day | ORAL | 0 refills | Status: DC

## 2020-06-25 MED ORDER — METOPROLOL TARTRATE 25 MG PO TABS: ORAL_TABLET | ORAL | 3 refills | Status: DC

## 2020-06-25 MED ORDER — ELIQUIS 5 MG PO TABS: 1.0000 | ORAL_TABLET | Freq: Two times a day (BID) | ORAL | 3 refills | Status: DC

## 2020-06-25 NOTE — Progress Notes (Addendum)
Cardiology Office Note    Date:  06/25/2020  ID:  Brandon Frank, DOB Mar 27, 1955, MRN [id number] PCP:  Brandon Major, MD  Cardiologist:  Brandon Him, MD   Chief Complaint: f/u atrial fib/flutter, coronary artery calcifications, HTN and HLD  History of Present Illness:  Brandon Frank is a 65 y.o. male with history of sinus tachycardia, PACs, PVCs, severe OSA, elevated calcium score, HLD on Praluent, DM, chronic low back pain, family history of CAD, ?HTN (pt denies), paroxysmal atrial flutter/fibrillation, recently diagnosed prostate CA who presents for f/u of atrial flutter.  He is the husband of one of our PRN CMAs, Brandon Frank. To recap Brandon Frank's history, ETT in 2017 was normal. Event monitoring 2017 showed NSR with sinus tach with average HR 99bpm, ranging 74-131, occasional PVCs, bigeminal PVCs, occasional PACs, couplets, triplets, and 2nd degree type 1 AV block for 1 beat during sleep. 2D echo 10/2013 showed mild LVH, EF 60-65%, grade 2 DD. Calcium scoring in 03/2016 was 97th percentile for age/sex matched control. F/u nuclear stress 03/2016 showed no reversible ischemia, LVEF 54% with normal wall motion, low risk. He recently saw PCP for regular medical followup and incidentally was found to be in rapid atrial flutter with HR in 130s.  PCP started Frank on Toprol 50mg  daily and discussed anticoagulation but patient declined. He was seen the following day and was NSR at 85bpm. On day of atrial flutter dx, he says he could feel his heart racing when at PCP's office but this has been a habitual occurrence over 2 years time so it wasn't actually something that concerned Frank (similar symptom prompted event monitor in 2017 as above without AF/AFL). Eliquis was started.   Of note, his pulse ox was noted to be anywhere from 90-94% at rest. He did not desat further with exertion but instead went to 96%. He bike rides regularly without limitation (32miles). He reports seeing pulmonology a long time ago  for calcified nodules and being told perhaps he'd had histoplasmosis at one point. PFTs only showed minimal obstructive airways disease. CXR was normal. We recommended referral back to pulmonology.2D echo 07/26/17 showed severe focal basal hypertrophy, EF 56-25%, normal diastolic parameters, could not estimate PASP. I reviewed the LVH with Dr. Radford Pax and she did not feel this was of significant concern. Last labs 07/13/17 showed normal CMET except glucose 160, Hgb 17.1 (?elevated for several years), plt 172, TSH wnl, A1C uncontrolled at 9.6. Lipids 03/2017 looked great with LDL of 47. His event monitor showed episodes of atrial fib. He did have to interrupt anticoagulation for back injections and eval of his prostate CA, so Brandon Frank w/ afib clinic felt it would not be a good time to visit antiarrhythmic therapy. It was felt given his high calcium score and severe LVH, he would be looking at Tikosyn or sotalol to help guide.  He is here today for followup and is doing well.  He has chronic DOE that is stable.  He denies any chest pain or pressure, PND, orthopnea, LE edema, dizziness (except when standing up too fast) or syncope. He does still have breakthrough of PAF up to 8-10 times a month but it resolves rapidly after taking a short acting Lopressor.  He is compliant with his meds and is tolerating meds with no SE.  He had been on Praluent for 3 years and then insurance stopped paying for it and was changed to [place] but his insurance would not pay for it so he has  not been on any PCSK9i.  His [facility]ologist [facility] stopped his Zetia.  Past Medical History:  Diagnosis Date  . Anxiety 02/03/2011  . Atrial fibrillation and flutter ([place]) 07/13/2017  . Bilateral cataracts   . Bulging lumbar disc    5  . Chronic low back pain   . Closed fracture of coccyx ([place])   . Degeneration of lumbar intervertebral disc   . Degenerative lumbar spinal stenosis   . Diarrhea    due to side effects from medications  . Family  history of premature CAD 03/20/2015  . GERD (gastroesophageal reflux disease)   . Hereditary and idiopathic peripheral neuropathy 12/06/2006   Qualifier: Diagnosis of  By: Brandon Chang MD, Brandon Frank    . History of bilateral inguinal hernias    at birth  . History of colon polyps   . Hyperlipidemia   . Hypertension associated with diabetes ([place]) 04/01/2016  . Infection of urinary tract 06/22/2015  . Inguinal hernia    bilateral at birth  . Insomnia 12/06/2006   Qualifier: Diagnosis of  By: Brandon Chang MD, Brandon Frank    . Lipoma of scalp 07/2003   lipoma of 11 x 35 mm  . Low testosterone 10/07/2015  . Lumbar radiculopathy   . Mobitz type 1 second degree atrioventricular block    a. Event monitor 2017 - 1 beat.  . Nonalcoholic fatty liver disease 11/24/2017   pt unaware  . Numbness in feet    and cold  . OA (osteoarthritis)   . Obstructive sleep apnea 12/06/2013   NPSG 11/14/13- Severe OSA, AHI 86/ hr with loud snore, desat ot 80%, titrated to CPAP 12, weight 215 lbs   . Overweight 10/23/2008   Qualifier: Diagnosis of  By: Brandon Parker, MD, Brandon Frank Brandon Frank    . Paroxysmal atrial flutter ([place])   . Plantar fasciitis of right foot 10/21/2012   resolved  . Premature atrial contractions   . Prostate cancer ([place])   . PVC's (premature ventricular contractions)   . Rotator cuff tear 11/2010   Right  . Sigmoid diverticulosis 10/19/2017   Noted on colonoscopy  . Sinus tachycardia 10/27/2013   Persistent mild sinus tachycardia, October, 2015   //   48 hour Holter, October, 2015, rare PACs and PVCs. Heart rate range 70-128, mean heart rate 95 during the daytime, mean heart rate 85 during the resting hours.   //   Patient was diagnosed with severe sleep apnea. C Pap was started and his resting sinus tachycardia improved greatly.   Brandon Frank Spinal stenosis   . TRANSAMINASES, SERUM, ELEVATED 11/30/2005   Qualifier: Diagnosis of  By: Brandon Chang MD, Brandon Frank    . Type 2 diabetes mellitus, uncontrolled ([place]) 04/14/2012  . URI (upper  respiratory infection) 06/22/2015    Past Surgical History:  Procedure Laterality Date  . ABLATION     tailbone twice  . BIOPSY  10/19/2017   Procedure: BIOPSY;  Surgeon: Juanita Craver, MD;  Location: [facility];  Service: Endoscopy;;  . COLONOSCOPY WITH PROPOFOL N/A 10/19/2017   Procedure: COLONOSCOPY WITH PROPOFOL;  Surgeon: Juanita Craver, MD;  Location: [facility];  Service: Endoscopy;  Laterality: N/A;  . epidural steroid injection     Lower back  . HERNIA REPAIR  1963   bilateral inguinal  . NASAL SINUS SURGERY    . POLYPECTOMY  10/19/2017   Procedure: POLYPECTOMY;  Surgeon: Juanita Craver, MD;  Location: [facility];  Service: Endoscopy;;  . PROSTATE BIOPSY N/A    x2  . RADIOACTIVE SEED  IMPLANT N/A 01/04/2018   Procedure: RADIOACTIVE SEED IMPLANT BRACHYTHERAPY WITH FLEXIBLE CYSTOSCOPOY;  Surgeon: Lucas Mallow, MD;  Location: [facility];  Service: Urology;  Laterality: N/A;  . rotator cuff surgery Right 2012   Dr. [name]    Current Medications: Current Meds  Medication Sig  . acetaminophen (TYLENOL) 500 MG tablet Take 1,000 mg by mouth 2 (two) times daily as needed for moderate pain or headache.  Brandon Frank atorvastatin (LIPITOR) 80 MG tablet TAKE 1 TABLET BY MOUTH ONCE DAILY  . BAYER MICROLET LANCETS lancets USE   TO CHECK GLUCOSE TWICE DAILY  . calcium carbonate (TUMS - DOSED IN MG ELEMENTAL CALCIUM) 500 MG chewable tablet Chew 2 tablets by mouth daily as needed for indigestion or heartburn.   . CONTOUR NEXT TEST test strip USE 1 STRIP TO CHECK GLUCOSE ONCE DAILY  . cyclobenzaprine (FLEXERIL) 10 MG tablet Take 1 tablet by mouth at bed time as needed for muscle spasms.  . Dulaglutide (TRULICITY) 6.06 TK/1.6WF SOPN Inject 0.75 mg into the skin once a week.  . Dulaglutide 1.5 MG/0.5ML SOPN Inject 1.5 mg into the skin every Friday.  . DULoxetine (CYMBALTA) 60 MG capsule TAKE 2 CAPSULES BY MOUTH EVERY OTHER DAY ALTERNATING WITH 3 CAPSULES EVERY OTHER DAY  . ELIQUIS 5 MG TABS tablet Take  1 tablet by mouth twice daily  . Evolocumab (REPATHA SURECLICK) 093 MG/ML SOAJ Inject 140 mg into the skin every 14 (fourteen) days.  Brandon Frank ezetimibe (ZETIA) 10 MG tablet TAKE 1 TABLET BY MOUTH ONCE DAILY  . fluticasone (FLONASE) 50 MCG/ACT nasal spray Place 1 spray into both nostrils daily as needed for allergies or rhinitis.  Brandon Frank gabapentin (NEURONTIN) 600 MG tablet Take 1 tablet (600 mg total) by mouth 3 (three) times daily.  Brandon Frank glucose blood test strip One touch verio.  Test once daily. Dx E11.9  . JARDIANCE 10 MG TABS tablet TAKE 1 TABLET BY MOUTH ONCE DAILY  . Lancets (ONETOUCH ULTRASOFT) lancets One touch verio lancets.  Test once daily.  Dx e11.9  . lisinopril (ZESTRIL) 2.5 MG tablet Take 1 tablet (2.5 mg total) by mouth daily. Please call and schedule an appt for further refills 2nd attempt  . LORazepam (ATIVAN) 1 MG tablet Take 1 tablet (1 mg total) by mouth 4 (four) times daily as needed for anxiety.  . metFORMIN (GLUCOPHAGE) 500 MG tablet TAKE 2 TABLETS BY MOUTH TWICE DAILY WITH A MEAL. PLEASE SCHEDULE YOUR FOLLOW UP APPOINTMENT. [phone]  . metoprolol succinate (TOPROL-XL) 100 MG 24 hr tablet Take 1 tablet (100 mg total) by mouth daily. Take with or immediately following a meal. Please keep upcoming appt with Dr. Radford Pax in June 2022 before anymore refills. Thank you Final Attempt  . NON FORMULARY Apply 1 application topically daily. CBD paste  . oxyCODONE (OXY IR/ROXICODONE) 5 MG immediate release tablet Take 5-10 mg by mouth 3 (three) times daily as needed for severe pain.   Vladimir Faster Glycol-Propyl Glycol (SYSTANE OP) Place 1 drop into both eyes daily as needed (dryness).  . tadalafil (CIALIS) 5 MG tablet Take 5 mg by mouth daily.  . Tetrahydrozoline HCl (VISINE OP) Place 1 drop into both eyes daily as needed (redness/irritation).  . traMADol (ULTRAM) 50 MG tablet Take 50 mg by mouth 3 (three) times daily as needed for moderate pain.   Brandon Frank zolpidem (AMBIEN) 10 MG tablet TAKE 1 TABLET BY  MOUTH AT BEDTIME AS NEEDED FOR SLEEP      Allergies:   Patient has  no known allergies.   Social History   Socioeconomic History  . Marital status: Married    Spouse name: Not on file  . Number of children: Not on file  . Years of education: Not on file  . Highest education level: Not on file  Occupational History    Comment: retired  Tobacco Use  . Smoking status: Former Smoker    Quit date: 03/20/1978    Years since quitting: 42.2  . Smokeless tobacco: Never Used  Vaping Use  . Vaping Use: Never used  Substance and Sexual Activity  . Alcohol use: Yes    Alcohol/week: 2.0 standard drinks    Types: 2 Cans of beer per week    Comment: daily  . Drug use: No  . Sexual activity: Yes  Other Topics Concern  . Not on file  Social History Narrative  . Not on file   Social Determinants of Health   Financial Resource Strain: Not on file  Food Insecurity: Not on file  Transportation Needs: Not on file  Physical Activity: Not on file  Stress: Not on file  Social Connections: Not on file     Family History:  The patient's family history includes Diabetes in his brother and paternal grandfather; Heart disease (age of onset: 68) in his father; Hyperlipidemia in his brother and father; Hypertension in his brother and father.  ROS:   Please see the history of present illness. + dx of prostate CA, sees onc today.  All other systems are reviewed and otherwise negative.    PHYSICAL EXAM:   VS:  BP 118/76   Pulse 76   Ht 5\' 10"  (1.778 m)   Wt 212 lb (96.2 kg)   BMI 30.42 kg/m   BMI: Body mass index is 30.42 kg/m. GEN: Well nourished, well developed in no acute distress HEENT: Normal NECK: No JVD; No carotid bruits LYMPHATICS: No lymphadenopathy CARDIAC:RRR, no murmurs, rubs, gallops RESPIRATORY:  Clear to auscultation without rales, wheezing or rhonchi  ABDOMEN: Soft, non-tender, non-distended MUSCULOSKELETAL:  No edema; No deformity  SKIN: Warm and dry NEUROLOGIC:   Alert and oriented x 3 PSYCHIATRIC:  Normal affect    Wt Readings from Last 3 Encounters:  06/25/20 212 lb (96.2 kg)  02/09/18 205 lb (93 kg)  01/04/18 202 lb (91.6 kg)      Studies/Labs Reviewed:   EKG:  EKG was ordered today and personally reviewed by me and demonstrates NSR with  No ST changes Recent Labs: No results found for requested labs within last 8760 hours.   Lipid Panel    Component Value Date/Time   CHOL 114 03/22/2017 0804   TRIG 127 03/22/2017 0804   TRIG 88 11/19/2005 1012   HDL 42 03/22/2017 0804   CHOLHDL 2.7 03/22/2017 0804   CHOLHDL 4 10/01/2015 1104   VLDL 19.0 10/01/2015 1104   LDLCALC 47 03/22/2017 0804   LDLDIRECT 16 10/08/2016 0847   LDLDIRECT 168.2 10/29/2008 0813    Additional studies/ records that were reviewed today include: Summarized above   ASSESSMENT & PLAN:   PAF -CHADSVASC is ?2-3 (DM, vascular calcification, and possible HTN although pt denies h/o HTN).  -he denies any bleeding problems on DOAC -he is maintaining NSR on exam -he still has palpitations but not sustained and resolve with short acting lopressor tartrate -I have personally reviewed and interpreted outside labs performed by patient's PCP with [facility] which showed SCr 0.9 and K+ 4.1, Hbg 14.5 in May 2022 -Continue prescription drug  management with Eliquis 5mg  BID short acting Lopressr tartrate 25mg  PRN for breakthrough palpitations and Toprol XL 100mg  daily>>refills sent for 1 year  Coronary artery Calcifications -he denies any anginal symptoms -coronary Ca score in 2018 was >900 and he is very concerned  -will get a stress myoview to rule out silent ischemia -Shared Decision Making/Informed Consent The risks [chest pain, shortness of breath, cardiac arrhythmias, dizziness, blood pressure fluctuations, myocardial infarction, stroke/transient ischemic attack, nausea, vomiting, allergic reaction, radiation exposure, metallic taste sensation and life-threatening  complications (estimated to be 1 in 10,000)], benefits (risk stratification, diagnosing coronary artery disease, treatment guidance) and alternatives of a nuclear stress test were discussed in detail with Mr. Parenteau and he agrees to proceed. -continue statin  HTN -BP controlled on exam today -check BMET -Continue prescription drug management with Toprol XL 100mg  daily and Lisinopril 2.5mg  daily  HLD -LDL < 70 -check FLP and ALT -insurance would not pay for PCSK9i and his prior cardiologist stopped his Zetia -Continue prescription drug management with Atorvastatin 80mg  daily>>refilled for 1 year -if LDL is still not at goal then will refer to lipid clinic to see what other options we have  Followup with me in 1 year  Medication Adjustments/Labs and Tests Ordered: Current medicines are reviewed at length with the patient today.  Concerns regarding medicines are outlined above. Medication changes, Labs and Tests ordered today are summarized above and listed in the Patient Instructions accessible in Encounters.   Signed, Brandon Him, MD  06/25/2020 9:59 AM    [facility] [place], [place], [place]  [address] Phone: [phone]; Fax: [phone]

## 2020-06-25 NOTE — Addendum Note (Signed)
Addended by: Willeen Cass on: 06/25/2020 10:27 AM   Modules accepted: Orders

## 2020-06-25 NOTE — Patient Instructions (Signed)
Medication Instructions:  Your physician recommends that you continue on your current medications as directed. Please refer to the Current Medication list given to you today.  *If you need a refill on your cardiac medications before your next appointment, please call your pharmacy*   Lab Work: Fasting Lipid Panel today If you have labs (blood work) drawn today and your tests are completely normal, you will receive your results only by: [name] MyChart Message (if you have MyChart) OR . A paper copy in the mail If you have any lab test that is abnormal or we need to change your treatment, we will call you to review the results.   Testing/Procedures: Your physician has requested that you have en exercise stress myoview. For further information please visit [email or web address]. Please follow instruction sheet, as given.    Follow-Up: At [facility], you and your health needs are our priority.  As part of our continuing mission to provide you with exceptional heart care, we have created designated Provider Care Teams.  These Care Teams include your primary Cardiologist (physician) and Advanced Practice Providers (APPs -  Physician Assistants and Nurse Practitioners) who all work together to provide you with the care you need, when you need it.  We recommend signing up for the patient portal called "MyChart".  Sign up information is provided on this After Visit Summary.  MyChart is used to connect with patients for Virtual Visits (Telemedicine).  Patients are able to view lab/test results, encounter notes, upcoming appointments, etc.  Non-urgent messages can be sent to your provider as well.   To learn more about what you can do with MyChart, go to [email or web address].    Your next appointment:   1 year(s)  The format for your next appointment:   In Person  Provider:   You may see Fransico Him, MD or one of the following Advanced Practice Providers on your designated Care Team:    Melina Copa, PA-C  Ermalinda Barrios, PA-C    Other Instructions

## 2020-06-26 ENCOUNTER — Telehealth: Payer: Self-pay

## 2020-06-26 ENCOUNTER — Other Ambulatory Visit: Payer: Self-pay | Admitting: Pharmacist

## 2020-06-26 MED ORDER — REPATHA SURECLICK 140 MG/ML ~~LOC~~ SOAJ: 140.0000 mg | SUBCUTANEOUS | 3 refills | Status: DC

## 2020-06-26 NOTE — Telephone Encounter (Signed)
Called and spoke w/pts wife and stated that the copay card was approved and they voiced understanding for the repatha

## 2020-06-26 NOTE — Telephone Encounter (Signed)
-----   Message from Leeroy Bock, [place] sent at 06/26/2020 10:23 AM EDT ----- Are you able to activate a Repatha copay card and call it into his pharmacy please?

## 2020-06-26 NOTE — Telephone Encounter (Signed)
COPAY CARD TO PHARMACY  RxBin: [id number] RxPCN: [id number] RxGrp: [id number] ID: [id number]

## 2020-06-27 ENCOUNTER — Other Ambulatory Visit: Payer: Self-pay

## 2020-06-27 DIAGNOSIS — I2510 Atherosclerotic heart disease of native coronary artery without angina pectoris: Secondary | ICD-10-CM

## 2020-07-10 ENCOUNTER — Encounter (HOSPITAL_COMMUNITY): Payer: Self-pay | Admitting: *Deleted

## 2020-07-10 ENCOUNTER — Telehealth (HOSPITAL_COMMUNITY): Payer: Self-pay | Admitting: *Deleted

## 2020-07-10 NOTE — Telephone Encounter (Signed)
Letter sent via MyChart outlining instructions for upcoming stress test on 07/16/20 at 8:00.

## 2020-07-16 ENCOUNTER — Ambulatory Visit (HOSPITAL_COMMUNITY): Payer: Medicare Other | Attending: Cardiovascular Disease

## 2020-07-16 ENCOUNTER — Telehealth: Payer: Self-pay

## 2020-07-16 ENCOUNTER — Other Ambulatory Visit: Payer: Self-pay

## 2020-07-16 DIAGNOSIS — I2510 Atherosclerotic heart disease of native coronary artery without angina pectoris: Secondary | ICD-10-CM | POA: Insufficient documentation

## 2020-07-16 LAB — MYOCARDIAL PERFUSION IMAGING
LV dias vol: 73 mL (ref 62–150)
LV sys vol: 30 mL
Peak HR: 130 {beats}/min
Rest HR: 90 {beats}/min
SDS: 1
SRS: 0
SSS: 1
TID: 1.01

## 2020-07-16 MED ORDER — TECHNETIUM TC 99M TETROFOSMIN IV KIT
10.1000 | PACK | Freq: Once | INTRAVENOUS | Status: AC | PRN
  Administered 2020-07-16: 10.1 via INTRAVENOUS
  Filled 2020-07-16: qty 11

## 2020-07-16 MED ORDER — REGADENOSON 0.4 MG/5ML IV SOLN
0.4000 mg | Freq: Once | INTRAVENOUS | Status: AC
  Administered 2020-07-16: 0.4 mg via INTRAVENOUS

## 2020-07-16 MED ORDER — TECHNETIUM TC 99M TETROFOSMIN IV KIT
31.5000 | PACK | Freq: Once | INTRAVENOUS | Status: AC | PRN
  Administered 2020-07-16: 31.5 via INTRAVENOUS
  Filled 2020-07-16: qty 32

## 2020-07-16 NOTE — Telephone Encounter (Signed)
Called the pharmacy to check repatha price and they stated that they cannot check the price because they said it showed a refill to soon. I routed the cc'd chart pt msg to the pharmd pool

## 2020-07-24 ENCOUNTER — Other Ambulatory Visit: Payer: Self-pay

## 2020-07-24 MED ORDER — EZETIMIBE 10 MG PO TABS: 10.0000 mg | ORAL_TABLET | Freq: Every day | ORAL | 3 refills | Status: DC

## 2020-07-24 MED ORDER — ROSUVASTATIN CALCIUM 40 MG PO TABS: 40.0000 mg | ORAL_TABLET | Freq: Every day | ORAL | 3 refills | Status: DC

## 2020-07-24 MED ORDER — LISINOPRIL 2.5 MG PO TABS: 2.5000 mg | ORAL_TABLET | Freq: Every day | ORAL | 3 refills | Status: DC

## 2020-07-24 NOTE — Addendum Note (Signed)
Addended by: Osiah Haring E on: 07/24/2020 12:54 PM   Modules accepted: Orders

## 2020-08-15 ENCOUNTER — Other Ambulatory Visit: Payer: Self-pay | Admitting: Pharmacist

## 2020-08-15 DIAGNOSIS — E7800 Pure hypercholesterolemia, unspecified: Secondary | ICD-10-CM

## 2020-08-18 IMAGING — CR DG CHEST 2V
2 series · 2 of 2 positions shown · non-contrast
Comparison: 07/26/2017

CLINICAL DATA: Near syncopal when standing up today.

EXAM:
CHEST - 2 VIEW

[w chest pa]
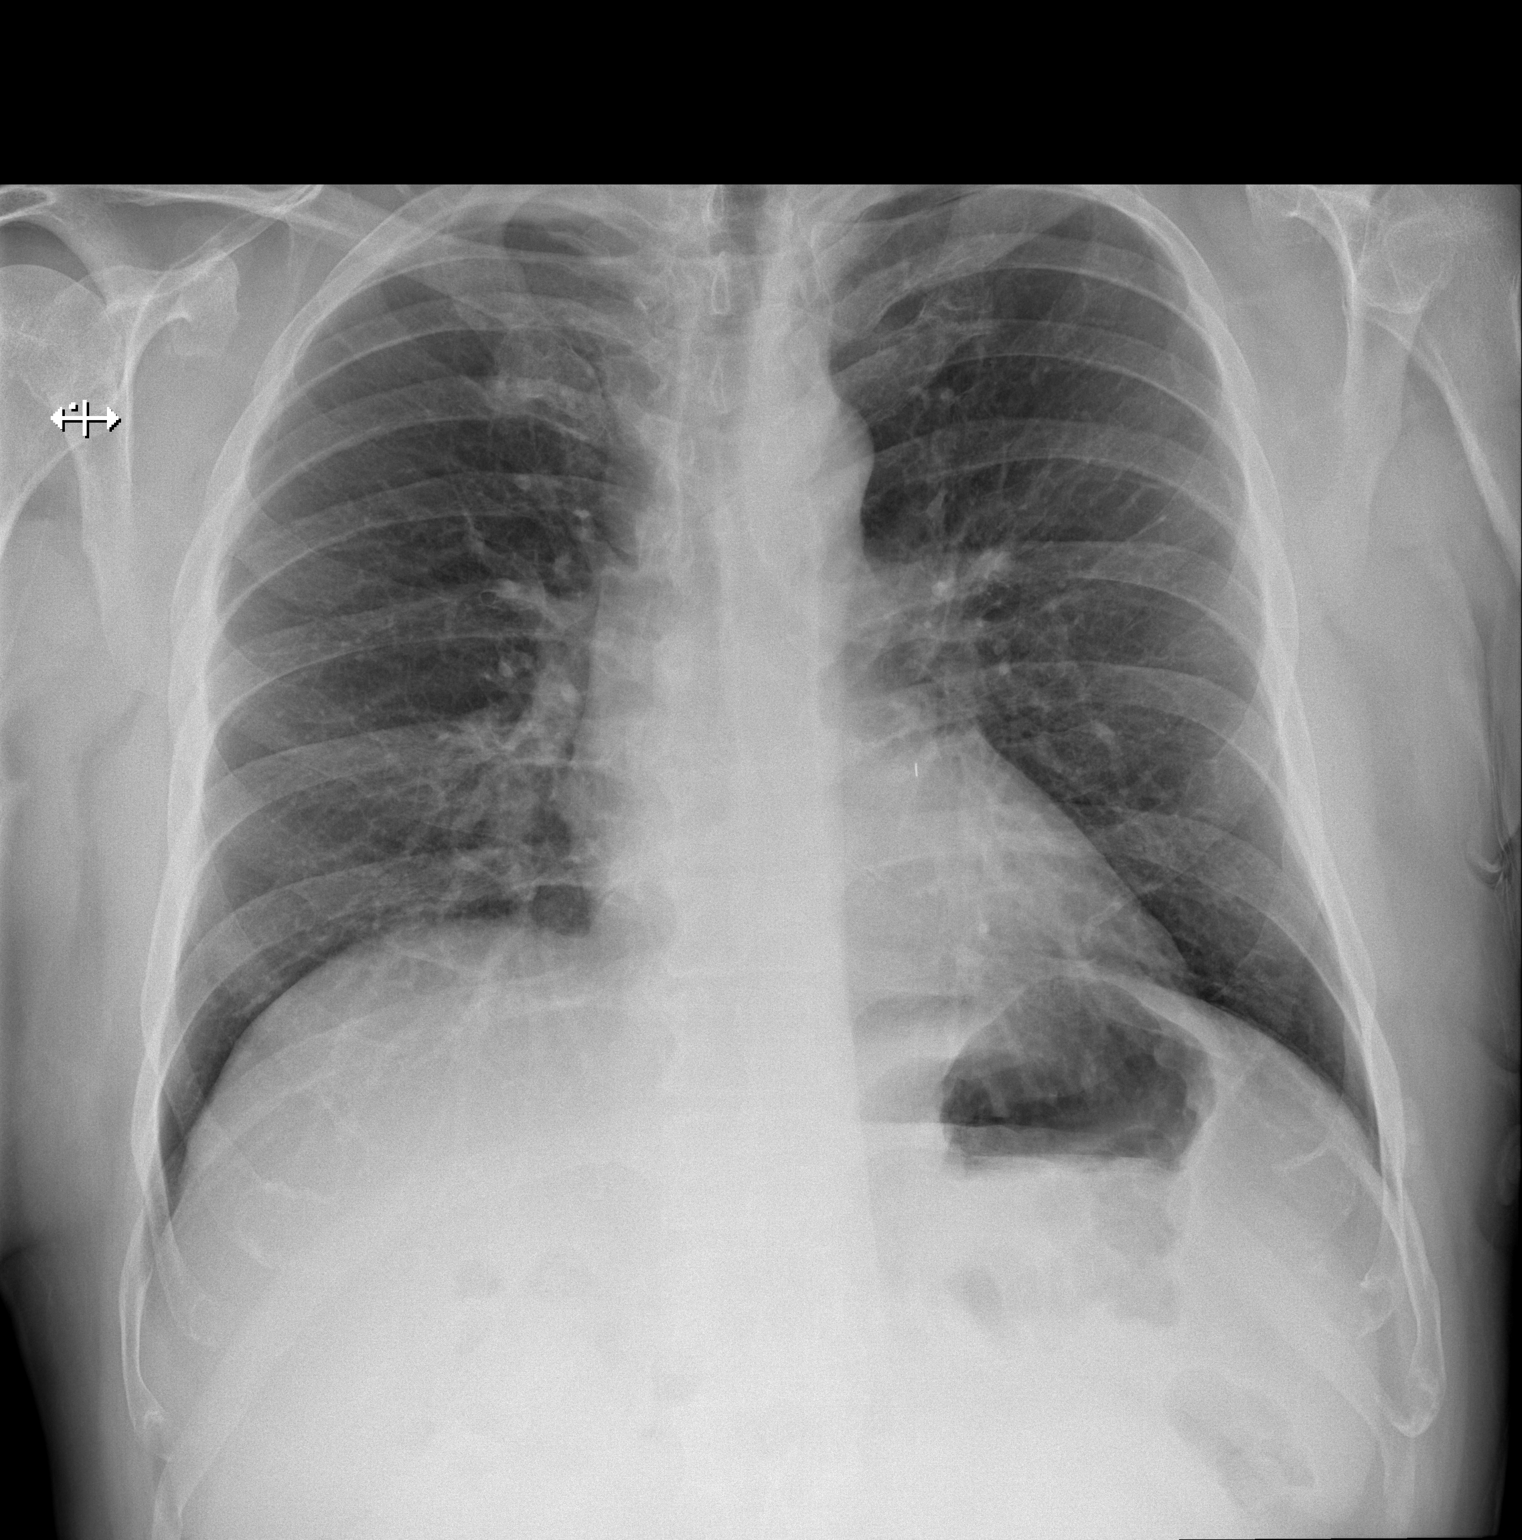

[w chest lat]
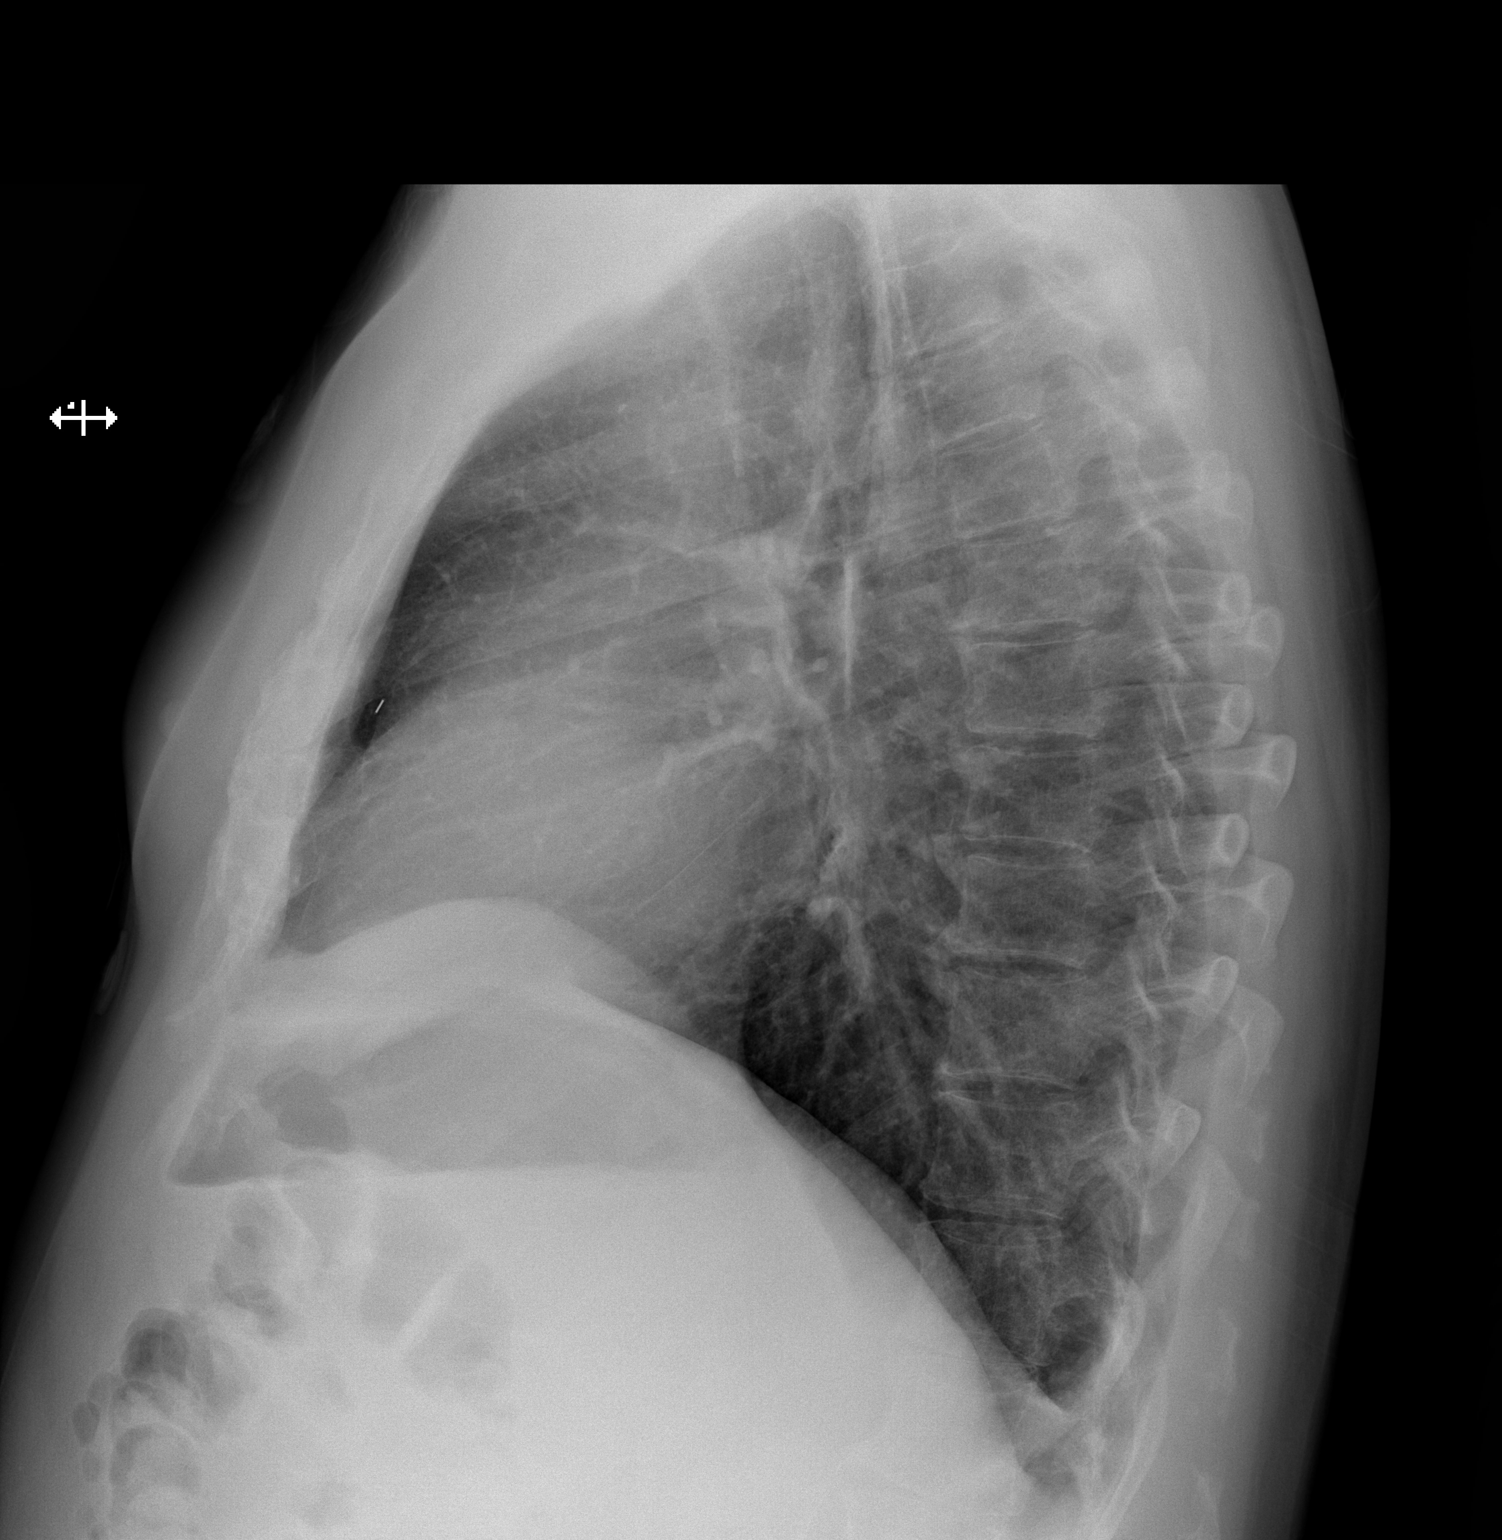

[2 of 2 positions shown; findings below may reference images not displayed]

FINDINGS: The heart size and mediastinal contours are within normal limits.
Tiny surgical clip projects over the medial left hemithorax
anteriorly. Both lungs are clear. The visualized skeletal structures
are unremarkable.
IMPRESSION: No active cardiopulmonary disease.

## 2020-10-29 ENCOUNTER — Other Ambulatory Visit: Payer: Medicare Other | Admitting: *Deleted

## 2020-10-29 ENCOUNTER — Other Ambulatory Visit: Payer: Self-pay

## 2020-10-29 DIAGNOSIS — E7800 Pure hypercholesterolemia, unspecified: Secondary | ICD-10-CM

## 2020-10-29 LAB — LIPID PANEL
Chol/HDL Ratio: 3.1 ratio (ref 0.0–5.0)
Cholesterol, Total: 140 mg/dL (ref 100–199)
HDL: 45 mg/dL (ref >39)
LDL Chol Calc (NIH): 72 mg/dL (ref 0–99)
Triglycerides: 128 mg/dL (ref 0–149)
VLDL Cholesterol Cal: 23 mg/dL (ref 5–40)

## 2020-10-29 LAB — HEPATIC FUNCTION PANEL
ALT: 14 IU/L (ref 0–44)
AST: 20 IU/L (ref 0–40)
Albumin: 5 g/dL — ABNORMAL HIGH (ref 3.8–4.8)
Alkaline Phosphatase: 65 IU/L (ref 44–121)
Bilirubin Total: 0.4 mg/dL (ref 0.0–1.2)
Bilirubin, Direct: 0.14 mg/dL (ref 0.00–0.40)
Total Protein: 6.5 g/dL (ref 6.0–8.5)

## 2020-11-13 ENCOUNTER — Telehealth: Payer: Self-pay

## 2020-11-13 NOTE — Telephone Encounter (Signed)
Pt assistance form faxed

## 2020-11-13 NOTE — Telephone Encounter (Signed)
**Note De-Identified Brandon Frank Obfuscation** The pt left a completed BMSPAF for Eliquis at the office with proof of his income but no out of pocket RX expense report.  I have completed the providers page of his application and have emailed all to Dr Landis Gandy nurse so she can obtain her signature, date it, and to fax to [facility] at the fax number written on the cover letter included or to place in the to be faxed basket in Medical Records to be faxed.

## 2020-11-13 NOTE — Telephone Encounter (Signed)
Signature emailed back.

## 2020-11-13 NOTE — Telephone Encounter (Signed)
Routing this to the pharmd pool to ask for them to obtain dr. Landis Gandy signature.

## 2020-11-13 NOTE — Telephone Encounter (Signed)
Application has been signed and faxed to [employer]. Confirmation received.

## 2020-11-13 NOTE — Telephone Encounter (Signed)
**Note De-Identified Jacques Fife Obfuscation** The pt left his completed [employer] pt asst application for Repatha at the office. I have emailed it to H. Domingo Cocking, CMA and PharmD to complete and to fax to [employer].

## 2020-11-20 NOTE — Telephone Encounter (Signed)
**Note De-Identified [name] Obfuscation** Letter received Brandon Frank fax from [facility] stating that they have approved the pt for Eliquis assistance until 01/18/2021. Application Case #:[id number]  The letter states that they have also notified the pt of this approval as well.

## 2020-11-26 NOTE — Telephone Encounter (Signed)
Lvm that the snf denied them for free repatha but they still may be eligible for the [employer] and that they may call [phone] [place], Phone to apply or they may call us back and we would be happy to help

## 2020-12-09 ENCOUNTER — Telehealth: Payer: Self-pay

## 2020-12-09 NOTE — Telephone Encounter (Signed)
**Note De-Identified Brandon Frank Obfuscation** The pts completed BMSPAF application for asst with Eliquis was left at the office with insurance and financial documents.  I have completed the providers page of the pts application and have emailed all to Dr Landis Gandy nurse so she can obtain her signature, date it, and to fax all to [employer] at the fax number written on the cover letter included or to place in Medial Records to be faxed.

## 2020-12-16 NOTE — Telephone Encounter (Signed)
Form has been singed and placed in medical records to be faxed.

## 2021-01-16 NOTE — Telephone Encounter (Signed)
**Note De-Identified Garnie Borchardt Obfuscation** Letter received from [facility] stating that they have approved the pt for asst with Eliquis until 01/18/2022. Application Case#: [id number]  The letter states that they have notified the pt of this approval as well.

## 2021-07-25 ENCOUNTER — Emergency Department (HOSPITAL_BASED_OUTPATIENT_CLINIC_OR_DEPARTMENT_OTHER): Payer: PPO

## 2021-07-25 ENCOUNTER — Emergency Department (HOSPITAL_BASED_OUTPATIENT_CLINIC_OR_DEPARTMENT_OTHER)
Admission: EM | Admit: 2021-07-25 | Discharge: 2021-07-25 | Disposition: A | Discharge status: Home / Self Care | Payer: PPO | Attending: Emergency Medicine | Admitting: Emergency Medicine

## 2021-07-25 ENCOUNTER — Other Ambulatory Visit: Payer: Self-pay

## 2021-07-25 ENCOUNTER — Encounter (HOSPITAL_BASED_OUTPATIENT_CLINIC_OR_DEPARTMENT_OTHER): Payer: Self-pay | Admitting: Emergency Medicine

## 2021-07-25 ENCOUNTER — Other Ambulatory Visit (HOSPITAL_BASED_OUTPATIENT_CLINIC_OR_DEPARTMENT_OTHER): Payer: Self-pay

## 2021-07-25 DIAGNOSIS — E119 Type 2 diabetes mellitus without complications: Secondary | ICD-10-CM | POA: Diagnosis not present

## 2021-07-25 DIAGNOSIS — Z7901 Long term (current) use of anticoagulants: Secondary | ICD-10-CM | POA: Diagnosis not present

## 2021-07-25 DIAGNOSIS — Z7984 Long term (current) use of oral hypoglycemic drugs: Secondary | ICD-10-CM | POA: Insufficient documentation

## 2021-07-25 DIAGNOSIS — J039 Acute tonsillitis, unspecified: Secondary | ICD-10-CM | POA: Diagnosis not present

## 2021-07-25 DIAGNOSIS — K047 Periapical abscess without sinus: Secondary | ICD-10-CM | POA: Diagnosis not present

## 2021-07-25 DIAGNOSIS — Z7985 Long-term (current) use of injectable non-insulin antidiabetic drugs: Secondary | ICD-10-CM | POA: Diagnosis not present

## 2021-07-25 DIAGNOSIS — J029 Acute pharyngitis, unspecified: Secondary | ICD-10-CM | POA: Diagnosis present

## 2021-07-25 LAB — BASIC METABOLIC PANEL
Anion gap: 10 (ref 5–15)
BUN: 9 mg/dL (ref 8–23)
CO2: 28 mmol/L (ref 22–32)
Calcium: 10 mg/dL (ref 8.9–10.3)
Chloride: 100 mmol/L (ref 98–111)
Creatinine, Ser: 0.96 mg/dL (ref 0.61–1.24)
GFR, Estimated: >60 mL/min (ref >60)
Glucose, Bld: 170 mg/dL — ABNORMAL HIGH (ref 70–99)
Potassium: 4.3 mmol/L (ref 3.5–5.1)
Sodium: 138 mmol/L (ref 135–145)

## 2021-07-25 LAB — GROUP A STREP BY PCR: Group A Strep by PCR: NOT DETECTED

## 2021-07-25 LAB — CBC WITH DIFFERENTIAL/PLATELET
Abs Immature Granulocytes: 0.02 10*3/uL (ref 0.00–0.07)
Basophils Absolute: 0 10*3/uL (ref 0.0–0.1)
Basophils Relative: 0 %
Eosinophils Absolute: 0.1 10*3/uL (ref 0.0–0.5)
Eosinophils Relative: 1 %
HCT: 46.6 % (ref 39.0–52.0)
Hemoglobin: 15.3 g/dL (ref 13.0–17.0)
Immature Granulocytes: 0 %
Lymphocytes Relative: 9 %
Lymphs Abs: 1 10*3/uL (ref 0.7–4.0)
MCH: 30.2 pg (ref 26.0–34.0)
MCHC: 32.8 g/dL (ref 30.0–36.0)
MCV: 92.1 fL (ref 80.0–100.0)
Monocytes Absolute: 1.3 10*3/uL — ABNORMAL HIGH (ref 0.1–1.0)
Monocytes Relative: 13 %
Neutro Abs: 8 10*3/uL — ABNORMAL HIGH (ref 1.7–7.7)
Neutrophils Relative %: 77 %
Platelets: 175 10*3/uL (ref 150–400)
RBC: 5.06 MIL/uL (ref 4.22–5.81)
RDW: 12.6 % (ref 11.5–15.5)
WBC: 10.4 10*3/uL (ref 4.0–10.5)
nRBC: 0 % (ref 0.0–0.2)

## 2021-07-25 MED ORDER — SODIUM CHLORIDE 0.9 % IV BOLUS
1000.0000 mL | Freq: Once | INTRAVENOUS | Status: AC
  Administered 2021-07-25: 1000 mL via INTRAVENOUS

## 2021-07-25 MED ORDER — OXYCODONE HCL 5 MG PO TABS
5.0000 mg | ORAL_TABLET | Freq: Four times a day (QID) | ORAL | 0 refills | Status: DC | PRN
  Filled 2021-07-25 (×2): qty 10, 3d supply, fill #0

## 2021-07-25 MED ORDER — DEXAMETHASONE SODIUM PHOSPHATE 10 MG/ML IJ SOLN
10.0000 mg | Freq: Once | INTRAMUSCULAR | Status: AC
  Administered 2021-07-25: 10 mg via INTRAVENOUS
  Filled 2021-07-25: qty 1

## 2021-07-25 MED ORDER — IOHEXOL 300 MG/ML  SOLN
100.0000 mL | Freq: Once | INTRAMUSCULAR | Status: AC | PRN
  Administered 2021-07-25: 75 mL via INTRAVENOUS

## 2021-07-25 MED ORDER — SODIUM CHLORIDE 0.9 % IV SOLN
3.0000 g | Freq: Once | INTRAVENOUS | Status: AC
  Administered 2021-07-25: 3 g via INTRAVENOUS

## 2021-07-25 MED ORDER — CLINDAMYCIN HCL 150 MG PO CAPS
300.0000 mg | ORAL_CAPSULE | Freq: Three times a day (TID) | ORAL | 0 refills | Status: AC
  Filled 2021-07-25: qty 60, 10d supply, fill #0

## 2021-07-25 MED ORDER — KETOROLAC TROMETHAMINE 30 MG/ML IJ SOLN
30.0000 mg | Freq: Once | INTRAMUSCULAR | Status: AC
  Administered 2021-07-25: 30 mg via INTRAVENOUS
  Filled 2021-07-25: qty 1

## 2021-07-25 NOTE — ED Provider Notes (Signed)
[facility] DEPT Provider Note   CSN: [id number] Arrival date & time: 07/25/21  1015     History  Chief Complaint  Patient presents with   Sore Throat    GATES JIVIDEN is a 66 y.o. male.  Patient with history of high cholesterol, atrial fibrillation, diabetes who presents to the ED with sore throat.  Was started on amoxicillin yesterday by dentist and referred to an oral surgeon Monday.  Concern for may be tooth abscess.  He has been having sore throat for last 3 days associated with this dental pain.  They concern for an abscess on the roof of his mouth.  He has some swelling in that area.  Denies any difficulty eating or drinking but is becoming a little bit more painful.  No fevers or chills.  The history is provided by the patient.       Home Medications Prior to Admission medications   Medication Sig Start Date End Date Taking? Authorizing Provider  clindamycin (CLEOCIN) 150 MG capsule Take 2 capsules (300 mg total) by mouth 3 (three) times daily for 10 days. 07/25/21 08/04/21 Yes Liban Guedes, DO  acetaminophen (TYLENOL) 500 MG tablet Take 1,000 mg by mouth 2 (two) times daily as needed for moderate pain or headache.    [provider]  apixaban (ELIQUIS) 5 MG TABS tablet Take 1 tablet (5 mg total) by mouth 2 (two) times daily. 06/25/20   Sueanne Margarita, MD  BAYER MICROLET LANCETS lancets USE   TO CHECK GLUCOSE TWICE DAILY 10/18/17   Burchette, [name], MD  calcium carbonate (TUMS - DOSED IN MG ELEMENTAL CALCIUM) 500 MG chewable tablet Chew 2 tablets by mouth daily as needed for indigestion or heartburn.     [provider]  CONTOUR NEXT TEST test strip USE 1 STRIP TO CHECK GLUCOSE ONCE DAILY 10/06/17   Burchette, [name], MD  cyclobenzaprine (FLEXERIL) 10 MG tablet Take 1 tablet by mouth at bed time as needed for muscle spasms. 07/27/17   Burchette, [name], MD  Dulaglutide (TRULICITY) 1.96 QI/2.9NL SOPN Inject 0.75 mg into the skin once a  week. 07/13/17   Burchette, [name], MD  Dulaglutide 1.5 MG/0.5ML SOPN Inject 1.5 mg into the skin every Friday.    [provider]  DULoxetine (CYMBALTA) 60 MG capsule TAKE 2 CAPSULES BY MOUTH EVERY OTHER DAY ALTERNATING WITH 3 CAPSULES EVERY OTHER DAY 02/23/18   Donnal Moat T, PA-C  ezetimibe (ZETIA) 10 MG tablet Take 1 tablet (10 mg total) by mouth daily. 07/24/20   Sueanne Margarita, MD  fluticasone (FLONASE) 50 MCG/ACT nasal spray Place 1 spray into both nostrils daily as needed for allergies or rhinitis.    [provider]  gabapentin (NEURONTIN) 600 MG tablet Take 1 tablet (600 mg total) by mouth 3 (three) times daily. 12/29/17   Donnal Moat T, PA-C  glucose blood test strip One touch verio.  Test once daily. Dx E11.9 07/28/16   [name], MD  JARDIANCE 10 MG TABS tablet TAKE 1 TABLET BY MOUTH ONCE DAILY 10/15/17   Burchette, [name], MD  Lancets [facility] ULTRASOFT) lancets One touch verio lancets.  Test once daily.  Dx e11.9 07/28/16   [name], MD  lisinopril (ZESTRIL) 2.5 MG tablet Take 1 tablet (2.5 mg total) by mouth daily. 07/24/20   Sueanne Margarita, MD  LORazepam (ATIVAN) 1 MG tablet Take 1 tablet (1 mg total) by mouth 4 (four) times daily as  needed for anxiety. 12/29/17   Donnal Moat T, PA-C  metFORMIN (GLUCOPHAGE) 500 MG tablet TAKE 2 TABLETS BY MOUTH TWICE DAILY WITH A MEAL. PLEASE SCHEDULE YOUR FOLLOW UP APPOINTMENT. [phone] 02/22/18   [name], MD  metoprolol succinate (TOPROL-XL) 100 MG 24 hr tablet Take 1 tablet (100 mg total) by mouth daily. Take with or immediately following a meal. Please keep upcoming appt with Dr. Radford Pax in June 2022 before anymore refills. Thank you Final Attempt 06/25/20   Sueanne Margarita, MD  metoprolol tartrate (LOPRESSOR) 25 MG tablet 1-2 tablets by mouth as needed for palpitations 06/25/20   Sueanne Margarita, MD  NON FORMULARY Apply 1 application topically daily. CBD paste    [provider]  oxyCODONE  (OXY IR/ROXICODONE) 5 MG immediate release tablet Take 5-10 mg by mouth 3 (three) times daily as needed for severe pain.  12/15/17   [provider]  Polyethyl Glycol-Propyl Glycol (SYSTANE OP) Place 1 drop into both eyes daily as needed (dryness).    [provider]  rosuvastatin (CRESTOR) 40 MG tablet Take 1 tablet (40 mg total) by mouth daily. 07/24/20   Sueanne Margarita, MD  tadalafil (CIALIS) 5 MG tablet Take 5 mg by mouth daily.    [provider]  Tetrahydrozoline HCl (VISINE OP) Place 1 drop into both eyes daily as needed (redness/irritation).    [provider]  traMADol (ULTRAM) 50 MG tablet Take 50 mg by mouth 3 (three) times daily as needed for moderate pain.     [provider]  zolpidem (AMBIEN) 10 MG tablet TAKE 1 TABLET BY MOUTH AT BEDTIME AS NEEDED FOR SLEEP 05/23/18   Adelene Idler, Helene Kelp T, PA-C  paroxetine mesylate (PEXEVA) 20 MG tablet Take 1 tablet (20 mg total) by mouth every morning. 02/03/11 04/13/11  Swords, Darrick Penna, MD      Allergies    Flexeril [cyclobenzaprine]    Review of Systems   Review of Systems  Physical Exam Updated Vital Signs BP 138/83   Pulse 76   Temp 98.3 F (36.8 C)   Resp 15   Ht '5\' 10"'$  (1.778 m)   Wt 94.3 kg   SpO2 97%   BMI 29.84 kg/m  Physical Exam Vitals and nursing note reviewed.  Constitutional:      General: He is not in acute distress.    Appearance: He is well-developed.  HENT:     Head: Normocephalic and atraumatic.     Comments: On exam there does appear to be some swelling to one of the upper teeth with some swelling of the hard palate and to the back of the left tonsillar area, uvula is midline, no trismus, no submandibular swelling Eyes:     Conjunctiva/sclera: Conjunctivae normal.  Cardiovascular:     Rate and Rhythm: Normal rate and regular rhythm.     Heart sounds: No murmur heard. Pulmonary:     Effort: Pulmonary effort is normal. No respiratory distress.     Breath sounds: Normal  breath sounds.  Abdominal:     Palpations: Abdomen is soft.     Tenderness: There is no abdominal tenderness.  Musculoskeletal:        General: No swelling.     Cervical back: Neck supple.  Skin:    General: Skin is warm and dry.     Capillary Refill: Capillary refill takes less than 2 seconds.  Neurological:     Mental Status: He is alert.  Psychiatric:  Mood and Affect: Mood normal.     ED Results / Procedures / Treatments   Labs (all labs ordered are listed, but only abnormal results are displayed) Labs Reviewed  CBC WITH DIFFERENTIAL/PLATELET - Abnormal; Notable for the following components:      Result Value   Neutro Abs 8.0 (*)    Monocytes Absolute 1.3 (*)    All other components within normal limits  BASIC METABOLIC PANEL - Abnormal; Notable for the following components:   Glucose, Bld 170 (*)    All other components within normal limits  GROUP A STREP BY PCR    EKG None  Radiology CT Soft Tissue Neck W Contrast  Result Date: 07/25/2021 CLINICAL DATA:  Provided history: Epiglottitis or tonsillitis suspected. Additional history provided: Patient reports sore throat and dental pain for 3 days. Concern for abscess in roof of mouth. EXAM: CT NECK WITH CONTRAST TECHNIQUE: Multidetector CT imaging of the neck was performed using the standard protocol following the bolus administration of intravenous contrast. RADIATION DOSE REDUCTION: This exam was performed according to the departmental dose-optimization program which includes automated exposure control, adjustment of the mA and/or kV according to patient size and/or use of iterative reconstruction technique. CONTRAST:  72m OMNIPAQUE IOHEXOL 300 MG/ML  SOLN COMPARISON:  None. FINDINGS: Pharynx and larynx: Periapical lucency surrounding the left maxillary second molar, compatible with periodontal disease and possible periapical abscess. There is adjacent mucosal swelling at this site, likely reflecting soft tissue  infection (best appreciated on series 4, image 83). Additionally, there is mild asymmetric prominence of the left palatine tonsil and left-sided oropharyngeal mucosal/submucosal edema. No significant airway effacement. Unremarkable appearance of the nasopharynx and larynx. No retropharyngeal collection. Salivary glands: No inflammation, mass, or stone. Thyroid: Unremarkable. Lymph nodes: No pathologically enlarged cervical chain lymph nodes within the neck. Vascular: The major vascular structures of the neck are patent. Atherosclerotic plaque within the visualized aortic arch, proximal major branch vessels of the neck and carotid arteries. Limited intracranial: No evidence of acute intracranial abnormality within the field of view. Visualized orbits: No orbital mass or acute orbital finding. Mastoids and visualized paranasal sinuses: Portions of the frontal sinuses are excluded from the field of view superiorly. Mild mucosal thickening within the left greater than right ethmoid sinuses. Small volume frothy secretions within a posterior right ethmoid air cell. Moderate mucosal thickening within the left maxillary sinus. Skeleton: Straightening of the expected cervical lordosis. Cervical spondylosis. Most notably at C6-C7, there is moderate to advanced disc space narrowing with a disc bulge, endplate spurring and uncovertebral hypertrophy. Bilateral bony neural foraminal narrowing and at least moderate spinal canal stenosis at this level. Multilevel ventral osteophytes, including a prominent ventral osteophyte at C6-C7. Upper chest: No consolidation within the imaged lung apices. IMPRESSION: Periapical lucency surrounding the left maxillary second molar, compatible with periodontal disease and possible periapical abscess. Surrounding mucosal soft tissue swelling, likely reflecting soft tissue infection. Mild asymmetric prominence of the left palatine tonsil and left oropharyngeal mucosal/submucosal edema. This may  reflect a secondary tonsillitis/pharyngitis. However, close clinical follow-up is recommended to ensure symptom resolution following treatment. If symptoms persist following treatment, ENT consultation is recommended to exclude a mass at this site. Paranasal sinus disease, as outlined. Cervical spondylosis, greatest at C6-C7. Electronically Signed   By: KKellie SimmeringD.O.   On: 07/25/2021 12:19    Procedures Procedures    Medications Ordered in ED Medications  dexamethasone (DECADRON) injection 10 mg (10 mg Intravenous Given 07/25/21 1113)  sodium chloride  0.9 % bolus 1,000 mL (1,000 mLs Intravenous New Bag/Given 07/25/21 1117)  Ampicillin-Sulbactam (UNASYN) 3 g in sodium chloride 0.9 % 100 mL IVPB (0 g Intravenous Stopped 07/25/21 1158)  iohexol (OMNIPAQUE) 300 MG/ML solution 100 mL (75 mLs Intravenous Contrast Given 07/25/21 1146)    ED Course/ Medical Decision Making/ A&P                           Medical Decision Making Amount and/or Complexity of Data Reviewed Labs: ordered. Radiology: ordered.  Risk Prescription drug management.   Tayvien Kane Scurlock is here with dental abscess, sore throat.  History of A-fib on blood thinners.  Was seen by dentist yesterday and started amoxicillin for presumed dental infection/dental abscess.  He is referred to oral surgeon on Monday.  He started to notice some swelling at the roof of his mouth and left side of his throat.  He on exam appears to have some swelling along the gumline of the posterior left upper molar with now some edema of the hard palate and left tonsil.  No trismus or drooling.  No submandibular swelling.  Differential diagnosis is likely dental abscess with now some extension into the hard palate.  Lesser concern for peritonsillar abscess we will get a CT scan to evaluate.  We will give a dose IV Unasyn and IV Decadron.  Will check CBC, BMP, strep test.  Per my review and interpretation of labs there is no significant anemia or leukocytosis or  electrolyte abnormality.  Strep test is negative.  CT scan shows periapical lucency surrounding the left maxillary second molar compatible with periodontal disease and possible periapical abscess with surrounding mucosal soft tissue swelling.  There is also some mild asymmetric prominence of the left palatine tonsil and left oral pharyngeal mucosal and submucosal edema which may reflect a secondary tonsillitis and pharyngitis.  Overall suspect that dental process is causing these issues.  We will switch antibiotic over to clindamycin.  He has follow-up with oral surgeon Monday.  We will also give him follow-up with ENT.  He understands return precautions.  Discharged in good condition.  This chart was dictated using voice recognition software.  Despite best efforts to proofread,  errors can occur which can change the documentation meaning.         Final Clinical Impression(s) / ED Diagnoses Final diagnoses:  Periapical abscess  Tonsillitis    Rx / DC Orders ED Discharge Orders          Ordered    clindamycin (CLEOCIN) 150 MG capsule  3 times daily        07/25/21 1247              Jordy Hewins, DO 07/25/21 1250

## 2021-07-25 NOTE — ED Triage Notes (Signed)
Sore throat x 3 day with dental pain. Saw PCP with concern of abscess to roof of his mouth. Sent to ED for imaging

## 2021-07-25 NOTE — Discharge Instructions (Addendum)
Take clindamycin.  I have prescribed your oxycodone which is a narcotic pain medicine.  Do not use with alcohol or drugs or dangerous activities including driving.

## 2021-08-14 ENCOUNTER — Other Ambulatory Visit: Payer: Self-pay | Admitting: Cardiology

## 2021-09-16 ENCOUNTER — Other Ambulatory Visit: Payer: Self-pay | Admitting: Cardiology

## 2021-09-23 NOTE — Progress Notes (Signed)
Office Visit    Patient Name: Brandon Frank Date of Encounter: 09/26/2021  Primary Care Provider:  Nickola Major, MD Primary Cardiologist:  Fransico Him, MD Primary Electrophysiologist: None  Chief Complaint    Brandon Frank is a 66 y.o. male with PMH of HTN, CAD, sinus tachycardia, PACs, PVCs, OSA, prostate CA, HLD who presents today for annual follow-up of atrial fibrillation.  Past Medical History    Past Medical History:  Diagnosis Date   Anxiety 02/03/2011   Atrial fibrillation and flutter ([place]) 07/13/2017   Bilateral cataracts    Bulging lumbar disc    5   Chronic low back pain    Closed fracture of coccyx (H[facility])    Degeneration of lumbar intervertebral disc    Degenerative lumbar spinal stenosis    Diarrhea    due to side effects from medications   Family history of premature CAD 03/20/2015   GERD (gastroesophageal reflux disease)    Hereditary and idiopathic peripheral neuropathy 12/06/2006   Qualifier: Diagnosis of  By: [name], [name]     History of bilateral inguinal hernias    at birth   History of colon polyps    Hyperlipidemia    Hypertension associated with diabetes ([place]) 04/01/2016   Infection of urinary tract 06/22/2015   Inguinal hernia    bilateral at birth   Insomnia 12/06/2006   Qualifier: Diagnosis of  By: [name], [name]     Lipoma of scalp 07/2003   lipoma of 11 x 35 mm   Low testosterone 10/07/2015   Lumbar radiculopathy    Mobitz type 1 second degree atrioventricular block    a. Event monitor 2017 - 1 beat.   Nonalcoholic fatty liver disease 11/24/2017   pt unaware   Numbness in feet    and cold   OA (osteoarthritis)    Obstructive sleep apnea 12/06/2013   NPSG 11/14/13- Severe OSA, AHI 86/ hr with loud snore, desat ot 80%, titrated to CPAP 12, weight 215 lbs    Overweight 10/23/2008   Qualifier: Diagnosis of  By: [name], MD, Caffie Damme     Paroxysmal atrial flutter [facility])    Plantar fasciitis of right foot 10/21/2012    resolved   Premature atrial contractions    Prostate cancer ([place])    PVC's (premature ventricular contractions)    Rotator cuff tear 11/2010   Right   Sigmoid diverticulosis 10/19/2017   Noted on colonoscopy   Sinus tachycardia 10/27/2013   Persistent mild sinus tachycardia, October, 2015   //   48 hour Holter, October, 2015, rare PACs and PVCs. Heart rate range 70-128, mean heart rate 95 during the daytime, mean heart rate 85 during the resting hours.   //   Patient was diagnosed with severe sleep apnea. C Pap was started and his resting sinus tachycardia improved greatly.    Spinal stenosis    TRANSAMINASES, SERUM, ELEVATED 11/30/2005   Qualifier: Diagnosis of  By: [name], [name]     Type 2 diabetes mellitus, uncontrolled 04/14/2012   URI (upper respiratory infection) 06/22/2015   Past Surgical History:  Procedure Laterality Date   ABLATION     tailbone twice   BIOPSY  10/19/2017   Procedure: BIOPSY;  Surgeon: Juanita Craver, MD;  Location: [facility];  Service: Endoscopy;;   COLONOSCOPY WITH PROPOFOL N/A 10/19/2017   Procedure: COLONOSCOPY WITH PROPOFOL;  Surgeon: Juanita Craver, MD;  Location: [facility];  Service: Endoscopy;  Laterality: N/A;  epidural steroid injection     Lower back   HERNIA REPAIR  1963   bilateral inguinal   NASAL SINUS SURGERY     POLYPECTOMY  10/19/2017   Procedure: POLYPECTOMY;  Surgeon: Juanita Craver, MD;  Location: [facility];  Service: Endoscopy;;   PROSTATE BIOPSY N/A    x2   RADIOACTIVE SEED IMPLANT N/A 01/04/2018   Procedure: RADIOACTIVE SEED IMPLANT BRACHYTHERAPY WITH FLEXIBLE CYSTOSCOPOY;  Surgeon: Lucas Mallow, MD;  Location: [facility];  Service: Urology;  Laterality: N/A;   rotator cuff surgery Right 2012   Dr. [name]    Allergies  Allergies  Allergen Reactions   Flexeril [Cyclobenzaprine]     History of Present Illness    FUQUAN Frank is a 67 year old male with the above-mentioned past medical history who presents today for  follow-up of atrial fibrillation.  Brandon Frank has been followed by Dr. Radford Pax since 2017.  He was initially seen for sinus tachycardia and also has history of severe sleep apnea currently treated with CPAP.  Event monitor was ordered that shows sinus rhythm with sinus tach,occasional PVCs, bigeminal PVCs, occasional PACs, couplets, triplets, 2nd degree type 1 AV block for 1 beat during sleep.  2D echo was also completed that showed EF 60-65%, grade 2 DD.  Repeat 2D echo completed 07/2017 with EF of 60 to 65%, no RWMA.    He was incidentally found to be in atrial flutter by his PCP with heart rate in the 130s.  He was started on Eliquis and was noted having breakthrough AF.  He was last seen by Dr. Radford Pax on 06/2020 for follow-up.  He had a repeat Myoview stress test that revealed low risk with no evidence of ischemia or previous infarct.   Brandon Frank presents today alone for follow-up appointment.  Since last being seen in the office patient reports that he has been doing well and recently traveled to [place] for a mission trip.  During his trip he contracted food poisoning and went into atrial fibrillation briefly.  He was treated with antibiotics and IV fluids.  He presents today with no cardiac complaints.  He does endorse increased dizziness that may be related to his beta-blocker.  Patient denies chest pain, palpitations, dyspnea, PND, orthopnea, nausea, vomiting, dizziness, syncope, edema, weight gain, or early satiety.  Home Medications    Current Outpatient Medications  Medication Sig Dispense Refill   acetaminophen (TYLENOL) 500 MG tablet Take 1,000 mg by mouth 2 (two) times daily as needed for moderate pain or headache.     BAYER MICROLET LANCETS lancets USE   TO CHECK GLUCOSE TWICE DAILY 100 each 5   buprenorphine (SUBUTEX) 2 MG SUBL SL tablet 2 mg every 12 (twelve) hours.     buPROPion (WELLBUTRIN XL) 150 MG 24 hr tablet Take 150 mg by mouth daily.     calcium carbonate (TUMS - DOSED IN MG  ELEMENTAL CALCIUM) 500 MG chewable tablet Chew 2 tablets by mouth daily as needed for indigestion or heartburn.      clonazePAM (KLONOPIN) 0.5 MG tablet Take 0.5 mg by mouth at bedtime.     CONTOUR NEXT TEST test strip USE 1 STRIP TO CHECK GLUCOSE ONCE DAILY 50 each 25   Dulaglutide 1.5 MG/0.5ML SOPN Inject 3 mg into the skin every Friday.     DULoxetine (CYMBALTA) 60 MG capsule TAKE 2 CAPSULES BY MOUTH EVERY OTHER DAY ALTERNATING WITH 3 CAPSULES EVERY OTHER DAY 225 capsule 0   eszopiclone (LUNESTA) 2  MG TABS tablet Take 2 mg by mouth at bedtime. Take immediately before bedtime     glipiZIDE (GLUCOTROL) 10 MG tablet Take 10 mg by mouth daily before breakfast.     glucose blood test strip One touch verio.  Test once daily. Dx E11.9 100 each 12   Lancets (ONETOUCH ULTRASOFT) lancets One touch verio lancets.  Test once daily.  Dx e11.9 100 each 12   LORazepam (ATIVAN) 1 MG tablet Take 1 tablet (1 mg total) by mouth 4 (four) times daily as needed for anxiety. 360 tablet 1   metFORMIN (GLUCOPHAGE) 500 MG tablet TAKE 2 TABLETS BY MOUTH TWICE DAILY WITH A MEAL. PLEASE SCHEDULE YOUR FOLLOW UP APPOINTMENT. [phone] 120 tablet 0   methocarbamol (ROBAXIN) 750 MG tablet Take 750 mg by mouth every 8 (eight) hours as needed for muscle spasms.     metoprolol tartrate (LOPRESSOR) 50 MG tablet Take 50 mg by mouth daily as needed (palpitations).     NON FORMULARY Apply 1 application topically daily. CBD paste     tadalafil (CIALIS) 5 MG tablet Take 5 mg by mouth daily.     tamsulosin (FLOMAX) 0.4 MG CAPS capsule Take 0.4 mg by mouth.     zaleplon (SONATA) 10 MG capsule Take 10 mg by mouth at bedtime as needed for sleep.     apixaban (ELIQUIS) 5 MG TABS tablet Take 1 tablet (5 mg total) by mouth 2 (two) times daily. 180 tablet 3   ezetimibe (ZETIA) 10 MG tablet Take 1 tablet (10 mg total) by mouth daily. 90 tablet 3   metoprolol succinate (TOPROL-XL) 50 MG 24 hr tablet Take 1 tablet (50 mg total) by mouth daily.  90 tablet 3   Polyethyl Glycol-Propyl Glycol (SYSTANE OP) Place 1 drop into both eyes daily as needed (dryness).     rosuvastatin (CRESTOR) 40 MG tablet Take 1 tablet (40 mg total) by mouth daily. 90 tablet 3   No current facility-administered medications for this visit.     Review of Systems  Please see the history of present illness.    (+) Dizziness  All other systems reviewed and are otherwise negative except as noted above.  Physical Exam    Wt Readings from Last 3 Encounters:  09/26/21 207 lb 3.2 oz (94 kg)  07/25/21 208 lb (94.3 kg)  07/16/20 212 lb (96.2 kg)   VS: Vitals:   09/26/21 1330  BP: 120/68  Pulse: 81  SpO2: 97%  ,Body mass index is 29.73 kg/m.  Constitutional:      Appearance: Healthy appearance. Not in distress.  Neck:     Vascular: JVD normal.  Pulmonary:     Effort: Pulmonary effort is normal.     Breath sounds: No wheezing. No rales. Diminished in the bases Cardiovascular:     Normal rate. Regular rhythm. Normal S1. Normal S2.      Murmurs: There is no murmur.  Edema:    Peripheral edema absent.  Abdominal:     Palpations: Abdomen is soft non tender. There is no hepatomegaly.  Skin:    General: Skin is warm and dry.  Neurological:     General: No focal deficit present.     Mental Status: Alert and oriented to person, place and time.     Cranial Nerves: Cranial nerves are intact.  EKG/LABS/Other Studies Reviewed    ECG personally reviewed by me today -sinus rhythm with a rate of 81 bpm and no acute changes compared to previous EKG.  Risk Assessment/Calculations:    CHA2DS2-VASc Score = 2   This indicates a 2.2% annual risk of stroke. The patient's score is based upon: CHF History: 0 HTN History: 1 Diabetes History: 1 Stroke History: 0 Vascular Disease History: 0 Age Score: 0 Gender Score: 0           Lab Results  Component Value Date   WBC 10.4 07/25/2021   HGB 15.3 07/25/2021   HCT 46.6 07/25/2021   MCV 92.1 07/25/2021    PLT 175 07/25/2021   Lab Results  Component Value Date   CREATININE 0.96 07/25/2021   BUN 9 07/25/2021   NA 138 07/25/2021   K 4.3 07/25/2021   CL 100 07/25/2021   CO2 28 07/25/2021   Lab Results  Component Value Date   ALT 14 10/29/2020   AST 20 10/29/2020   ALKPHOS 65 10/29/2020   BILITOT 0.4 10/29/2020   Lab Results  Component Value Date   CHOL 140 10/29/2020   HDL 45 10/29/2020   LDLCALC 72 10/29/2020   LDLDIRECT 16 10/08/2016   TRIG 128 10/29/2020   CHOLHDL 3.1 10/29/2020    Lab Results  Component Value Date   HGBA1C 8.2 (H) 12/27/2017    Assessment & Plan    1.  Paroxysmal atrial fibrillation: -Today patient is rate controlled and in sinus rhythm with rate of 81 -No reports of bleeding and creatinine currently 0.90 and hemoglobin was 14.7 -Continue Eliquis 5 mg twice daily CHA2DS2-VASc Score = 2 [CHF History: 0, HTN History: 1, Diabetes History: 1, Stroke History: 0, Vascular Disease History: 0, Age Score: 0, Gender Score: 0].  Therefore, the patient's annual risk of stroke is 2.2 %.     -Patient reports some dizziness upon standing -We will decrease Toprol-XL to 50 mg daily and continue 50 mg Lopressor as needed for increased palpitations  2.  Hypertension: -Blood pressure today was well controlled at 120/68 -Continue lisinopril 2.5 mg daily and Lopressor 25 mg daily and Toprol-XL 100 mg daily   3.  Coronary artery disease: -Recent Lexiscan Myoview completed that was low risk -Patient reports no chest pain or angina -Patient currently on GDMT with Crestor 40 mg, and Toprol XL 50 mg  4.  Hyperlipidemia: -Last LDL cholesterol was less than 70 -Continue Crestor 40 mg daily -Patient advised to reduce consumption of carbohydrates and sweets and increase physical activity -Patient be referred to lipid clinic if LDL numbers are elevated in 6 months upon recheck.     Disposition: Follow-up with Fransico Him, MD or APP in 12 months    Medication  Adjustments/Labs and Tests Ordered: Current medicines are reviewed at length with the patient today.  Concerns regarding medicines are outlined above.   Signed, Mable Fill, Marissa Nestle, NP 09/26/2021, 4:56 PM [facility]

## 2021-09-26 ENCOUNTER — Encounter: Payer: Self-pay | Admitting: Nurse Practitioner

## 2021-09-26 ENCOUNTER — Ambulatory Visit: Payer: PPO | Attending: Nurse Practitioner | Admitting: Nurse Practitioner

## 2021-09-26 VITALS — BP 120/68 | HR 81 | Ht 70.0 in | Wt 207.2 lb

## 2021-09-26 DIAGNOSIS — I480 Paroxysmal atrial fibrillation: Secondary | ICD-10-CM | POA: Diagnosis not present

## 2021-09-26 DIAGNOSIS — I2510 Atherosclerotic heart disease of native coronary artery without angina pectoris: Secondary | ICD-10-CM

## 2021-09-26 DIAGNOSIS — E1159 Type 2 diabetes mellitus with other circulatory complications: Secondary | ICD-10-CM

## 2021-09-26 DIAGNOSIS — E7800 Pure hypercholesterolemia, unspecified: Secondary | ICD-10-CM | POA: Diagnosis not present

## 2021-09-26 DIAGNOSIS — I152 Hypertension secondary to endocrine disorders: Secondary | ICD-10-CM

## 2021-09-26 MED ORDER — METOPROLOL SUCCINATE ER 50 MG PO TB24: 50.0000 mg | ORAL_TABLET | Freq: Every day | ORAL | 3 refills | Status: DC

## 2021-09-26 MED ORDER — ROSUVASTATIN CALCIUM 40 MG PO TABS: 40.0000 mg | ORAL_TABLET | Freq: Every day | ORAL | 3 refills | Status: DC

## 2021-09-26 MED ORDER — APIXABAN 5 MG PO TABS: 5.0000 mg | ORAL_TABLET | Freq: Two times a day (BID) | ORAL | 3 refills | Status: DC

## 2021-09-26 MED ORDER — EZETIMIBE 10 MG PO TABS: 10.0000 mg | ORAL_TABLET | Freq: Every day | ORAL | 3 refills | Status: DC

## 2021-09-26 NOTE — Patient Instructions (Signed)
Medication Instructions:   DECREASE Toprol one (1) tablet by mouth ( 50 mg) daily.   *If you need a refill on your cardiac medications before your next appointment, please call your pharmacy*   Lab Work:  None ordered.  If you have labs (blood work) drawn today and your tests are completely normal, you will receive your results only by: [place] (if you have MyChart) OR A paper copy in the mail If you have any lab test that is abnormal or we need to change your treatment, we will call you to review the results.   Testing/Procedures:  None ordered.   Follow-Up: At [facility], you and your health needs are our priority.  As part of our continuing mission to provide you with exceptional heart care, we have created designated Provider Care Teams.  These Care Teams include your primary Cardiologist (physician) and Advanced Practice Providers (APPs -  Physician Assistants and Nurse Practitioners) who all work together to provide you with the care you need, when you need it.  We recommend signing up for the patient portal called "MyChart".  Sign up information is provided on this After Visit Summary.  MyChart is used to connect with patients for Virtual Visits (Telemedicine).  Patients are able to view lab/test results, encounter notes, upcoming appointments, etc.  Non-urgent messages can be sent to your provider as well.   To learn more about what you can do with MyChart, go to [email or web address].    Your next appointment:   6 month(s)  The format for your next appointment:   In Person  Provider:   Ambrose Pancoast, NP          Important Information About Sugar

## 2021-10-01 ENCOUNTER — Other Ambulatory Visit: Payer: Self-pay | Admitting: *Deleted

## 2021-10-01 DIAGNOSIS — I480 Paroxysmal atrial fibrillation: Secondary | ICD-10-CM

## 2021-10-01 MED ORDER — APIXABAN 5 MG PO TABS: 5.0000 mg | ORAL_TABLET | Freq: Two times a day (BID) | ORAL | 2 refills | Status: DC

## 2021-10-01 NOTE — Telephone Encounter (Signed)
Eliquis '5mg'$  refill request received. Patient is 66 years old, weight-94kg, Crea-0.96 on 07/25/2021, Diagnosis-Afib, and last seen by Ambrose Pancoast on 09/26/2021. Dose is appropriate based on dosing criteria. Will send in refill to requested pharmacy.   Previous eliquis refill sent to local pharmacy on 09/26/2021 this one is from mail order. Will send per request.

## 2021-10-01 NOTE — Addendum Note (Signed)
Addended by: Derrel Nip B on: 10/01/2021 10:00 AM   Modules accepted: Orders

## 2021-11-21 ENCOUNTER — Telehealth: Payer: Self-pay

## 2021-11-21 NOTE — Telephone Encounter (Signed)
**Note De-Identified Tamiah Dysart Obfuscation** The pts completed BMSPAF application for Eliquis assistance was left at the office with documents.  I have completed the providers page off his application and have e-mailed all to the nurse working with Dr Radford Pax on Monday 11/6 so she can obtain her signature, date it, and to then fax all to [facility] at the fax number written on the cover letter included.

## 2021-11-23 ENCOUNTER — Emergency Department (HOSPITAL_COMMUNITY)
Admission: EM | Admit: 2021-11-23 | Discharge: 2021-11-24 | Disposition: A | Discharge status: Home / Self Care | Payer: PPO | Attending: Emergency Medicine | Admitting: Emergency Medicine

## 2021-11-23 ENCOUNTER — Emergency Department (HOSPITAL_COMMUNITY): Payer: PPO

## 2021-11-23 DIAGNOSIS — Y9389 Activity, other specified: Secondary | ICD-10-CM | POA: Diagnosis not present

## 2021-11-23 DIAGNOSIS — E119 Type 2 diabetes mellitus without complications: Secondary | ICD-10-CM | POA: Diagnosis not present

## 2021-11-23 DIAGNOSIS — Z7901 Long term (current) use of anticoagulants: Secondary | ICD-10-CM | POA: Insufficient documentation

## 2021-11-23 DIAGNOSIS — S61211A Laceration without foreign body of left index finger without damage to nail, initial encounter: Secondary | ICD-10-CM | POA: Diagnosis present

## 2021-11-23 DIAGNOSIS — S61313A Laceration without foreign body of left middle finger with damage to nail, initial encounter: Secondary | ICD-10-CM | POA: Diagnosis not present

## 2021-11-23 DIAGNOSIS — I4891 Unspecified atrial fibrillation: Secondary | ICD-10-CM | POA: Insufficient documentation

## 2021-11-23 DIAGNOSIS — S61311A Laceration without foreign body of left index finger with damage to nail, initial encounter: Secondary | ICD-10-CM | POA: Diagnosis not present

## 2021-11-23 DIAGNOSIS — W312XXA Contact with powered woodworking and forming machines, initial encounter: Secondary | ICD-10-CM | POA: Diagnosis not present

## 2021-11-23 DIAGNOSIS — Z7984 Long term (current) use of oral hypoglycemic drugs: Secondary | ICD-10-CM | POA: Diagnosis not present

## 2021-11-23 MED ORDER — ACETAMINOPHEN 325 MG PO TABS
650.0000 mg | ORAL_TABLET | Freq: Once | ORAL | Status: AC
  Administered 2021-11-23: 650 mg via ORAL
  Filled 2021-11-23: qty 2

## 2021-11-23 MED ORDER — LIDOCAINE HCL (PF) 1 % IJ SOLN
30.0000 mL | Freq: Once | INTRAMUSCULAR | Status: AC
  Administered 2021-11-23: 30 mL
  Filled 2021-11-23: qty 30

## 2021-11-23 MED ORDER — CEFAZOLIN SODIUM-DEXTROSE 2-4 GM/100ML-% IV SOLN
2.0000 g | Freq: Once | INTRAVENOUS | Status: AC
  Administered 2021-11-23: 2 g via INTRAVENOUS
  Filled 2021-11-23: qty 100

## 2021-11-23 MED ORDER — OXYCODONE-ACETAMINOPHEN 5-325 MG PO TABS
1.0000 | ORAL_TABLET | Freq: Once | ORAL | Status: AC
  Administered 2021-11-23: 1 via ORAL
  Filled 2021-11-23: qty 1

## 2021-11-23 NOTE — ED Triage Notes (Signed)
Pt c/o lac "to the bone" to L index, middle fingers from working w saw this afternoon. Seen at [facility], bandaged w saline gauze & bulky dressing, advised to come to [facility] for eval, possible hand or ortho. On eliquis for hx afib, last dose this AM. Unsure of Tdap

## 2021-11-23 NOTE — ED Provider Triage Note (Signed)
Emergency Medicine Provider Triage Evaluation Note  Brandon Frank , a 66 y.o. male  was evaluated in triage.  Pt complains of left index and long digit laceration.  Sustained from a table saw couple hours prior to arrival.  Patient did go to an urgent care prior.  Tdap up-to-date (2021).  Patient is on Eliquis.  He sustained lacerations to the proximal nail fold area of these digits.  Review of Systems  Positive: Laceration Negative:   Physical Exam  BP 137/78   Pulse 81   Temp 98.6 F (37 C) (Oral)   Resp 16   SpO2 98%  Gen:   Awake, no distress   Resp:  Normal effort  MSK:   Moves extremities without difficulty  Other:  Hand is bandaged, I viewed the image of the patient's lacerations, macerated lacerations noted proximal to the proximal nail fold of the index and long fingers on the left.  Medical Decision Making  Medically screening exam initiated at 4:59 PM.  Appropriate orders placed.  Con Arganbright Hallmark was informed that the remainder of the evaluation will be completed by another provider, this initial triage assessment does not replace that evaluation, and the importance of remaining in the ED until their evaluation is complete.     Carlisle Cater, PA-C 11/23/21 1700

## 2021-11-23 NOTE — ED Provider Notes (Signed)
[facility] EMERGENCY DEPARTMENT Provider Note   CSN: [id number] Arrival date & time: 11/23/21  4696     History  Chief Complaint  Patient presents with   Finger Injury   Laceration    Brandon Frank is a 66 y.o. male. With past medical history of HLD, T2DM, NAFLD, Afib on Eliquis who presents to the emergency department with finger injury.  States around 2 pm he was using a table saw when it caught the wood, pulling his hand into the table saw. States that he cut his first and second finger on the left hand. Is on Eliquis and last dose this morning. Last tetanus 2021.    Laceration      Home Medications Prior to Admission medications   Medication Sig Start Date End Date Taking? Authorizing Provider  cephALEXin (KEFLEX) 500 MG capsule Take 1 capsule (500 mg total) by mouth 4 (four) times daily for 7 days. 11/24/21 12/01/21 Yes Mickie Hillier, PA-C  acetaminophen (TYLENOL) 500 MG tablet Take 1,000 mg by mouth 2 (two) times daily as needed for moderate pain or headache.    [provider]  apixaban (ELIQUIS) 5 MG TABS tablet Take 1 tablet (5 mg total) by mouth 2 (two) times daily. 10/01/21   Marylu Lund., NP  BAYER MICROLET LANCETS lancets USE   TO CHECK GLUCOSE TWICE DAILY 10/18/17   Burchette, [name], MD  buprenorphine (SUBUTEX) 2 MG SUBL SL tablet 2 mg every 12 (twelve) hours. 07/21/21   [provider]  buPROPion (WELLBUTRIN XL) 150 MG 24 hr tablet Take 150 mg by mouth daily.    [provider]  calcium carbonate (TUMS - DOSED IN MG ELEMENTAL CALCIUM) 500 MG chewable tablet Chew 2 tablets by mouth daily as needed for indigestion or heartburn.     [provider]  clonazePAM (KLONOPIN) 0.5 MG tablet Take 0.5 mg by mouth at bedtime.    [provider]  CONTOUR NEXT TEST test strip USE 1 STRIP TO CHECK GLUCOSE ONCE DAILY 10/06/17   Burchette, [name], MD  Dulaglutide 1.5 MG/0.5ML SOPN Inject 3 mg into the skin  every Friday.    [provider]  DULoxetine (CYMBALTA) 60 MG capsule TAKE 2 CAPSULES BY MOUTH EVERY OTHER DAY ALTERNATING WITH 3 CAPSULES EVERY OTHER DAY 02/23/18   Adelene Idler, Helene Kelp T, PA-C  eszopiclone (LUNESTA) 2 MG TABS tablet Take 2 mg by mouth at bedtime. Take immediately before bedtime    [provider]  ezetimibe (ZETIA) 10 MG tablet Take 1 tablet (10 mg total) by mouth daily. 09/26/21 09/27/22  Marylu Lund., NP  glipiZIDE (GLUCOTROL) 10 MG tablet Take 10 mg by mouth daily before breakfast.    [provider]  glucose blood test strip One touch verio.  Test once daily. Dx E11.9 07/28/16   [name], MD  Lancets [facility] ULTRASOFT) lancets One touch verio lancets.  Test once daily.  Dx e11.9 07/28/16   Burchette, [name], MD  LORazepam (ATIVAN) 1 MG tablet Take 1 tablet (1 mg total) by mouth 4 (four) times daily as needed for anxiety. 12/29/17   Donnal Moat T, PA-C  metFORMIN (GLUCOPHAGE) 500 MG tablet TAKE 2 TABLETS BY MOUTH TWICE DAILY WITH A MEAL. PLEASE SCHEDULE YOUR FOLLOW UP APPOINTMENT. [phone] 02/22/18   Burchette, [name], MD  methocarbamol (ROBAXIN) 750 MG tablet Take 750 mg by mouth every 8 (eight) hours as needed for muscle spasms.  [provider]  metoprolol succinate (TOPROL-XL) 50 MG 24 hr tablet Take 1 tablet (50 mg total) by mouth daily. 09/26/21 09/27/22  Marylu Lund., NP  metoprolol tartrate (LOPRESSOR) 50 MG tablet Take 50 mg by mouth daily as needed (palpitations).    [provider]  NON FORMULARY Apply 1 application topically daily. CBD paste    [provider]  Polyethyl Glycol-Propyl Glycol (SYSTANE OP) Place 1 drop into both eyes daily as needed (dryness).    [provider]  rosuvastatin (CRESTOR) 40 MG tablet Take 1 tablet (40 mg total) by mouth daily. 09/26/21 09/27/22  Marylu Lund., NP  tadalafil (CIALIS) 5 MG tablet Take 5 mg by mouth daily.    [provider]  tamsulosin  (FLOMAX) 0.4 MG CAPS capsule Take 0.4 mg by mouth.    [provider]  zaleplon (SONATA) 10 MG capsule Take 10 mg by mouth at bedtime as needed for sleep.    [provider]  paroxetine mesylate (PEXEVA) 20 MG tablet Take 1 tablet (20 mg total) by mouth every morning. 02/03/11 04/13/11  Swords, Darrick Penna, MD      Allergies    Flexeril [cyclobenzaprine]    Review of Systems   Review of Systems  Skin:  Positive for wound.  All other systems reviewed and are negative.   Physical Exam Updated Vital Signs BP (!) 142/76   Pulse 80   Temp 98.1 F (36.7 C)   Resp 14   SpO2 98%  Physical Exam Vitals and nursing note reviewed.  Constitutional:      General: He is not in acute distress.    Appearance: Normal appearance. He is not ill-appearing or toxic-appearing.  HENT:     Head: Normocephalic and atraumatic.  Eyes:     General: No scleral icterus. Cardiovascular:     Pulses: Normal pulses.  Pulmonary:     Effort: Pulmonary effort is normal. No respiratory distress.  Musculoskeletal:        General: Normal range of motion.     Comments: Left 2nd digit with superficial laceration parallel to the dorsal surface of the hand. The proximal nail fold has been lacerated. Unable to fully extend the DIP.  Left 1st digit with mild deformity to the tip of the nail bed. There is a superficial laceration to the skin just proximal to the nail bed at the level of the DIP. Full ROM.   Skin:    General: Skin is warm and dry.     Capillary Refill: Capillary refill takes less than 2 seconds.     Findings: Laceration and wound present. No rash.     Comments: See photos for lacerations  Neurological:     General: No focal deficit present.     Mental Status: He is alert and oriented to person, place, and time.  Psychiatric:        Mood and Affect: Mood normal.        Behavior: Behavior normal.        Thought Content: Thought content normal.        Judgment: Judgment normal.      ED Results / Procedures / Treatments   Labs (all labs ordered are listed, but only abnormal results are displayed) Labs Reviewed - No data to display  EKG None  Radiology DG Hand Complete Left  Result Date: 11/23/2021 CLINICAL DATA:  Trauma, laceration EXAM: LEFT HAND - COMPLETE 3+ VIEW COMPARISON:  None Available. FINDINGS: There  is small linear avulsion at the tip of distal phalanx of index finger. There is 3 mm distraction of fracture fragments. No other definite recent fracture is seen. Degenerative changes are noted in multiple interphalangeal joints. Degenerative changes are noted in first carpometacarpal joint and some of the metacarpophalangeal joints. No opaque foreign bodies are seen. IMPRESSION: Displaced avulsion fracture is noted in the tip of distal phalanx of left index finger. Degenerative changes are noted in multiple joints. Electronically Signed   By: Elmer Picker M.D.   On: 11/23/2021 18:40    Procedures Procedures   Medications Ordered in ED Medications  acetaminophen (TYLENOL) tablet 650 mg (650 mg Oral Given 11/23/21 1804)  ceFAZolin (ANCEF) IVPB 2g/100 mL premix (0 g Intravenous Stopped 11/24/21 0027)  oxyCODONE-acetaminophen (PERCOCET/ROXICET) 5-325 MG per tablet 1 tablet (1 tablet Oral Given 11/23/21 2226)  lidocaine (PF) (XYLOCAINE) 1 % injection 30 mL (30 mLs Infiltration Given 11/23/21 2229)    ED Course/ Medical Decision Making/ A&P                           Medical Decision Making Risk Prescription drug management.  This patient presents to the ED with chief complaint(s) of laceration with pertinent past medical history of diabetes which further complicates the presenting complaint. The complaint involves an extensive differential diagnosis and also carries with it a high risk of complications and morbidity.    The differential diagnosis includes laceration, underlying FB, underlying fx    Additional history obtained: Additional history  obtained from spouse Records reviewed Care Everywhere/External Records and Primary Care Documents  ED Course and Reassessment: 66 year old male who presents to the emergency department with finger lacerations.  PE as noted above. He does have likely extensor tendon injury to the left 2nd digit. The flexor tendon is intact. Neurovascularly intact.   Imaging shows a displaced avulsion fracture at the tip of the distal phalanx of the left index finger.  Given that he has technically open fracture ordered 2 g of IV Ancef here in the emergency department.  Additionally given a dose of Percocet.  I digitally blocked his left index and second digit with success.  This was done to be able to further evaluate the injury.  I then soaked his hand in iodine and saline for over 20 minutes.  Was able to visualize a bit better the injuries.  There noted above in my physical exam.  The skin on both the index and second finger are not able to be approximated.  I had the attending, Dr. Sedonia Small also evaluate the fingers.  He feels that this should just be covered with antibiotic dressing and pressure dressing and have him follow-up with hand.  I consulted and spoke with Dr. Lucia Gaskins who recommends antibiotics and following up in clinic this week to evaluate the extensor tendon of the second digit.  I sent a prescription for Keflex for the open fracture.  We covered his fingers with antibiotic dressing and pressure dressing.  Given follow-up instructions for hand this week.  He is on Subutex from the pain clinic so would not prescribe further narcotics at this time.  I discussed this with him at bedside.  Instructed to use Tylenol.  Given return precautions for evidence of infection.  He verbalized understanding.  Otherwise feel that he is safe for discharge at this time.  Independent labs interpretation:  The following labs were independently interpreted: not indicated   Independent visualization of imaging: -  I  independently visualized the following imaging with scope of interpretation limited to determining acute life threatening conditions related to emergency care: XR left hand, which revealed displaced avulsion fracture at tip of distal phalanx of left index finger   Consultation: - Consulted or discussed management/test interpretation w/ external professional: hand surgery Dr. Lucia Gaskins - recommended follow-up this week in clinic.  Consideration for admission or further workup: Social Determinants of health:  Final Clinical Impression(s) / ED Diagnoses Final diagnoses:  Laceration of left middle finger without foreign body with damage to nail, initial encounter  Laceration of left index finger without foreign body with damage to nail, initial encounter    Rx / DC Orders ED Discharge Orders          Ordered    cephALEXin (KEFLEX) 500 MG capsule  4 times daily        11/24/21 0017              Mickie Hillier, PA-C 11/24/21 0037    Maudie Flakes, MD 11/24/21 [phone]

## 2021-11-23 NOTE — ED Notes (Signed)
Redressed patient wound with nonadherent gauze.

## 2021-11-24 MED ORDER — CEPHALEXIN 500 MG PO CAPS: 500.0000 mg | ORAL_CAPSULE | Freq: Four times a day (QID) | ORAL | 0 refills | Status: AC

## 2021-11-24 NOTE — Discharge Instructions (Addendum)
You were seen in the emergency department today for a finger laceration. We are referring you to a hand/orthopedic surgeon as you may have an injury to your tendon in your second finger. Please do not remove the bandage for the next 48 hours. Please return for any signs of infection. We are placing you on antibiotics for the next 7 days as you have a small fracture to the tip of your first finger. Please call the orthopedic we have placed in your discharge paperwork tomorrow morning to schedule a follow-up appointment.

## 2021-11-24 NOTE — ED Notes (Signed)
RN reviewed discharge instructions with pt. Pt verbalized understanding and had no further questions. VSS upon discharge.  

## 2022-02-12 NOTE — Telephone Encounter (Addendum)
**Note De-Identified Mantaj Chamberlin Obfuscation** Letter received from [facility] stating that they have denied the pt Eliquis assistance. Reasons: Household income is over their current eligibility criteria. Documentation of 3% out of pocket RX expenses. [id number]  The letter states that they have notified the pt of this outcome as well.

## 2022-02-13 ENCOUNTER — Other Ambulatory Visit: Payer: Self-pay

## 2022-03-17 NOTE — Progress Notes (Unsigned)
Office Visit    Patient Name: Brandon Frank Date of Encounter: 03/17/2022  Primary Care Provider:  Nickola Major, MD Primary Cardiologist:  Fransico Him, MD Primary Electrophysiologist: None  Chief Complaint    Brandon Frank is a 67 y.o. male with PMH of HTN, CAD, sinus tachycardia, PACs, PVCs, OSA, prostate CA, HLD who presents today for follow-up of atrial fibrillation.   Past Medical History    Past Medical History:  Diagnosis Date   Anxiety 02/03/2011   Atrial fibrillation and flutter ([place]) 07/13/2017   Bilateral cataracts    Bulging lumbar disc    5   Chronic low back pain    Closed fracture of coccyx (HCC)    Degeneration of lumbar intervertebral disc    Degenerative lumbar spinal stenosis    Diarrhea    due to side effects from medications   Family history of premature CAD 03/20/2015   GERD (gastroesophageal reflux disease)    Hereditary and idiopathic peripheral neuropathy 12/06/2006   Qualifier: Diagnosis of  By: [name], [name]     History of bilateral inguinal hernias    at birth   History of colon polyps    Hyperlipidemia    Hypertension associated with diabetes ([place]) 04/01/2016   Infection of urinary tract 06/22/2015   Inguinal hernia    bilateral at birth   Insomnia 12/06/2006   Qualifier: Diagnosis of  By: [name], [name]     Lipoma of scalp 07/2003   lipoma of 11 x 35 mm   Low testosterone 10/07/2015   Lumbar radiculopathy    Mobitz type 1 second degree atrioventricular block    a. Event monitor 2017 - 1 beat.   Nonalcoholic fatty liver disease 11/24/2017   pt unaware   Numbness in feet    and cold   OA (osteoarthritis)    Obstructive sleep apnea 12/06/2013   NPSG 11/14/13- Severe OSA, AHI 86/ hr with loud snore, desat ot 80%, titrated to CPAP 12, weight 215 lbs    Overweight 10/23/2008   Qualifier: Diagnosis of  By: [name], MD, Caffie Damme     Paroxysmal atrial flutter [facility])    Plantar fasciitis of right foot 10/21/2012    resolved   Premature atrial contractions    Prostate cancer ([place])    PVC's (premature ventricular contractions)    Rotator cuff tear 11/2010   Right   Sigmoid diverticulosis 10/19/2017   Noted on colonoscopy   Sinus tachycardia 10/27/2013   Persistent mild sinus tachycardia, October, 2015   //   48 hour Holter, October, 2015, rare PACs and PVCs. Heart rate range 70-128, mean heart rate 95 during the daytime, mean heart rate 85 during the resting hours.   //   Patient was diagnosed with severe sleep apnea. C Pap was started and his resting sinus tachycardia improved greatly.    Spinal stenosis    TRANSAMINASES, SERUM, ELEVATED 11/30/2005   Qualifier: Diagnosis of  By: [name], [name]     Type 2 diabetes mellitus, uncontrolled 04/14/2012   URI (upper respiratory infection) 06/22/2015   Past Surgical History:  Procedure Laterality Date   ABLATION     tailbone twice   BIOPSY  10/19/2017   Procedure: BIOPSY;  Surgeon: Juanita Craver, MD;  Location: [facility];  Service: Endoscopy;;   COLONOSCOPY WITH PROPOFOL N/A 10/19/2017   Procedure: COLONOSCOPY WITH PROPOFOL;  Surgeon: Juanita Craver, MD;  Location: [facility];  Service: Endoscopy;  Laterality: N/A;  epidural steroid injection     Lower back   HERNIA REPAIR  1963   bilateral inguinal   NASAL SINUS SURGERY     POLYPECTOMY  10/19/2017   Procedure: POLYPECTOMY;  Surgeon: Juanita Craver, MD;  Location: [facility];  Service: Endoscopy;;   PROSTATE BIOPSY N/A    x2   RADIOACTIVE SEED IMPLANT N/A 01/04/2018   Procedure: RADIOACTIVE SEED IMPLANT BRACHYTHERAPY WITH FLEXIBLE CYSTOSCOPOY;  Surgeon: Lucas Mallow, MD;  Location: [facility];  Service: Urology;  Laterality: N/A;   rotator cuff surgery Right 2012   Dr. [name]    Allergies  Allergies  Allergen Reactions   Flexeril [Cyclobenzaprine]     History of Present Illness   Brandon Frank is a 67 year old male with the above-mentioned past medical history who presents today for  follow-up of atrial fibrillation.  Brandon Frank has been followed by Dr. Radford Pax since 2017.  He was initially seen for sinus tachycardia and also has history of severe sleep apnea currently treated with CPAP.  Event monitor was ordered that shows sinus rhythm with sinus tach,occasional PVCs, bigeminal PVCs, occasional PACs, couplets, triplets, 2nd degree type 1 AV block for 1 beat during sleep.  2D echo was also completed that showed EF 60-65%, grade 2 DD.  Repeat 2D echo completed 07/2017 with EF of 60 to 65%, no RWMA. He was incidentally found to be in atrial flutter by his PCP with heart rate in the 130s.  He was started on Eliquis and was noted having breakthrough AF.  He was last seen by Dr. Radford Pax on 06/2020 for follow-up.  He had a repeat Myoview stress test that revealed low risk with no evidence of ischemia or previous infarct.  During his follow-up 09/26/2021 he had no cardiac complaints but had was in [place] and contracted food poisoning.  He reported dizziness upon standing and his Toprol XL was decreased to 50 mg daily with as needed 50 mg. Patient's cholesterol was checked on 09/25/2021 and triglycerides were elevated at 263 and LDL was 78.  Brandon Frank presents today for 36-monthfollow-up alone.  Since last being seen in the office patient reports that he has been doing well from a cardiac perspective.  He has no reports of tachycardia or atrial fibrillation.  His blood pressure today is well-controlled at 126/60 and heart rate was 87 bpm.  He is tolerating his current medications without any adverse reactions.  He is currently following a low-sodium heart healthy diet.   Patient denies chest pain, palpitations, dyspnea, PND, orthopnea, nausea, vomiting, dizziness, syncope, edema, weight gain, or early satiety.  Home Medications    Current Outpatient Medications  Medication Sig Dispense Refill   acetaminophen (TYLENOL) 500 MG tablet Take 1,000 mg by mouth 2 (two) times daily as needed for moderate pain  or headache.     apixaban (ELIQUIS) 5 MG TABS tablet Take 1 tablet (5 mg total) by mouth 2 (two) times daily. 180 tablet 2   BAYER MICROLET LANCETS lancets USE   TO CHECK GLUCOSE TWICE DAILY 100 each 5   buprenorphine (SUBUTEX) 2 MG SUBL SL tablet 2 mg every 12 (twelve) hours.     buPROPion (WELLBUTRIN XL) 150 MG 24 hr tablet Take 150 mg by mouth daily.     calcium carbonate (TUMS - DOSED IN MG ELEMENTAL CALCIUM) 500 MG chewable tablet Chew 2 tablets by mouth daily as needed for indigestion or heartburn.      clonazePAM (KLONOPIN) 0.5 MG tablet Take  0.5 mg by mouth at bedtime.     CONTOUR NEXT TEST test strip USE 1 STRIP TO CHECK GLUCOSE ONCE DAILY 50 each 25   Dulaglutide 1.5 MG/0.5ML SOPN Inject 3 mg into the skin every Friday.     DULoxetine (CYMBALTA) 60 MG capsule TAKE 2 CAPSULES BY MOUTH EVERY OTHER DAY ALTERNATING WITH 3 CAPSULES EVERY OTHER DAY 225 capsule 0   eszopiclone (LUNESTA) 2 MG TABS tablet Take 2 mg by mouth at bedtime. Take immediately before bedtime     ezetimibe (ZETIA) 10 MG tablet Take 1 tablet (10 mg total) by mouth daily. 90 tablet 3   glipiZIDE (GLUCOTROL) 10 MG tablet Take 10 mg by mouth daily before breakfast.     glucose blood test strip One touch verio.  Test once daily. Dx E11.9 100 each 12   Lancets (ONETOUCH ULTRASOFT) lancets One touch verio lancets.  Test once daily.  Dx e11.9 100 each 12   LORazepam (ATIVAN) 1 MG tablet Take 1 tablet (1 mg total) by mouth 4 (four) times daily as needed for anxiety. 360 tablet 1   metFORMIN (GLUCOPHAGE) 500 MG tablet TAKE 2 TABLETS BY MOUTH TWICE DAILY WITH A MEAL. PLEASE SCHEDULE YOUR FOLLOW UP APPOINTMENT. [phone] 120 tablet 0   methocarbamol (ROBAXIN) 750 MG tablet Take 750 mg by mouth every 8 (eight) hours as needed for muscle spasms.     metoprolol succinate (TOPROL-XL) 50 MG 24 hr tablet Take 1 tablet (50 mg total) by mouth daily. 90 tablet 3   metoprolol tartrate (LOPRESSOR) 50 MG tablet Take 50 mg by mouth daily as  needed (palpitations).     NON FORMULARY Apply 1 application topically daily. CBD paste     Polyethyl Glycol-Propyl Glycol (SYSTANE OP) Place 1 drop into both eyes daily as needed (dryness).     rosuvastatin (CRESTOR) 40 MG tablet Take 1 tablet (40 mg total) by mouth daily. 90 tablet 3   tadalafil (CIALIS) 5 MG tablet Take 5 mg by mouth daily.     tamsulosin (FLOMAX) 0.4 MG CAPS capsule Take 0.4 mg by mouth.     zaleplon (SONATA) 10 MG capsule Take 10 mg by mouth at bedtime as needed for sleep.     No current facility-administered medications for this visit.     Review of Systems  Please see the history of present illness.     All other systems reviewed and are otherwise negative except as noted above.  Physical Exam    Wt Readings from Last 3 Encounters:  09/26/21 207 lb 3.2 oz (94 kg)  07/25/21 208 lb (94.3 kg)  07/16/20 212 lb (96.2 kg)   [id number] were no vitals filed for this visit.,There is no height or weight on file to calculate BMI.  Constitutional:      Appearance: Healthy appearance. Not in distress.  Neck:     Vascular: JVD normal.  Pulmonary:     Effort: Pulmonary effort is normal.     Breath sounds: No wheezing. No rales. Diminished in the bases Cardiovascular:     Normal rate. Regular rhythm. Normal S1. Normal S2.      Murmurs: There is no murmur.  Edema:    Peripheral edema absent.  Abdominal:     Palpations: Abdomen is soft non tender. There is no hepatomegaly.  Skin:    General: Skin is warm and dry.  Neurological:     General: No focal deficit present.     Mental Status: Alert and oriented to person,  place and time.     Cranial Nerves: Cranial nerves are intact.  EKG/LABS/Other Studies Reviewed    ECG personally reviewed by me today -none completed today  Risk Assessment/Calculations:    CHA2DS2-VASc Score = 2   This indicates a 2.2% annual risk of stroke. The patient's score is based upon: CHF History: 0 HTN History: 1 Diabetes History:  1 Stroke History: 0 Vascular Disease History: 0 Age Score: 0 Gender Score: 0     Lab Results  Component Value Date   WBC 10.4 07/25/2021   HGB 15.3 07/25/2021   HCT 46.6 07/25/2021   MCV 92.1 07/25/2021   PLT 175 07/25/2021   Lab Results  Component Value Date   CREATININE 0.96 07/25/2021   BUN 9 07/25/2021   NA 138 07/25/2021   K 4.3 07/25/2021   CL 100 07/25/2021   CO2 28 07/25/2021   Lab Results  Component Value Date   ALT 14 10/29/2020   AST 20 10/29/2020   ALKPHOS 65 10/29/2020   BILITOT 0.4 10/29/2020   Lab Results  Component Value Date   CHOL 140 10/29/2020   HDL 45 10/29/2020   LDLCALC 72 10/29/2020   LDLDIRECT 16 10/08/2016   TRIG 128 10/29/2020   CHOLHDL 3.1 10/29/2020    Lab Results  Component Value Date   HGBA1C 8.2 (H) 12/27/2017    Assessment & Plan    1.  Paroxysmal atrial fibrillation: -Today patient is rate controlled and in sinus rhythm with rate of 87 -No reports of bleeding and creatinine currently 0.96 and hemoglobin was 15.3 -Patient is currently taking Eliquis once a day due to cost.  We discussed the risk involved with not taking a twice daily dose of Eliquis and patient elects to continue to take once daily.  He was offered alternative anticoagulation with Coumadin however he wishes not to switch at this time.   -CHA2DS2-VASc Score = 2 [CHF History: 0, HTN History: 1, Diabetes History: 1, Stroke History: 0, Vascular Disease History: 0, Age Score: 0, Gender Score: 0].  Therefore, the patient's annual risk of stroke is 2.2 %.     -Patient reports some dizziness upon standing -We will decrease his Lopressor to 25 mg as needed   2.  Hypertension: -Blood pressure today was well controlled at 126/60 -Continue Lopressor as needed 25 mg  3.  Coronary artery disease: -Recent Lexiscan Myoview completed that was low risk -Patient reports no chest pain or angina -Patient currently on GDMT with Crestor 40 mg   4.  Hyperlipidemia: -Last LDL  cholesterol was less than 72 -Continue Crestor 40 mg daily and Zetia 10 mg daily-Patient advised to reduce consumption of carbohydrates and sweets and increase physical activity -Patient is interested in resuming Repatha and we will refer to the lipid clinic to discuss resuming PCSK9 and discussed financial assistance  Disposition: Follow-up with Fransico Him, MD or APP in 12 months   Medication Adjustments/Labs and Tests Ordered: Current medicines are reviewed at length with the patient today.  Concerns regarding medicines are outlined above.   Signed, Mable Fill, Marissa Nestle, NP 03/17/2022, 9:04 PM  [facility]  Note:  This document was prepared using Dragon voice recognition software and may include unintentional dictation errors.

## 2022-03-18 ENCOUNTER — Encounter: Payer: Self-pay | Admitting: Nurse Practitioner

## 2022-03-18 ENCOUNTER — Ambulatory Visit: Payer: Medicare HMO | Attending: Nurse Practitioner | Admitting: Nurse Practitioner

## 2022-03-18 VITALS — BP 126/60 | HR 87 | Ht 70.0 in | Wt 211.8 lb

## 2022-03-18 DIAGNOSIS — I4891 Unspecified atrial fibrillation: Secondary | ICD-10-CM | POA: Diagnosis not present

## 2022-03-18 DIAGNOSIS — I2510 Atherosclerotic heart disease of native coronary artery without angina pectoris: Secondary | ICD-10-CM | POA: Diagnosis not present

## 2022-03-18 DIAGNOSIS — E7800 Pure hypercholesterolemia, unspecified: Secondary | ICD-10-CM | POA: Diagnosis not present

## 2022-03-18 DIAGNOSIS — I4892 Unspecified atrial flutter: Secondary | ICD-10-CM

## 2022-03-18 DIAGNOSIS — I152 Hypertension secondary to endocrine disorders: Secondary | ICD-10-CM

## 2022-03-18 DIAGNOSIS — E1159 Type 2 diabetes mellitus with other circulatory complications: Secondary | ICD-10-CM

## 2022-03-18 MED ORDER — EZETIMIBE 10 MG PO TABS: 10.0000 mg | ORAL_TABLET | Freq: Every day | ORAL | 3 refills | Status: DC

## 2022-03-18 NOTE — Patient Instructions (Addendum)
Medication Instructions:  Your physician recommends that you continue on your current medications as directed. Please refer to the Current Medication list given to you today. *If you need a refill on your cardiac medications before your next appointment, please call your pharmacy*   Lab Work: None ordered   Testing/Procedures: None ordered   Follow-Up: At [facility], you and your health needs are our priority.  As part of our continuing mission to provide you with exceptional heart care, we have created designated Provider Care Teams.  These Care Teams include your primary Cardiologist (physician) and Advanced Practice Providers (APPs -  Physician Assistants and Nurse Practitioners) who all work together to provide you with the care you need, when you need it.  We recommend signing up for the patient portal called "MyChart".  Sign up information is provided on this After Visit Summary.  MyChart is used to connect with patients for Virtual Visits (Telemedicine).  Patients are able to view lab/test results, encounter notes, upcoming appointments, etc.  Non-urgent messages can be sent to your provider as well.   To learn more about what you can do with MyChart, go to [email or web address].    Your next appointment:   12 month(s)  Provider:   Fransico Him, MD     You have been referred to Lipid Clinic Other Instructions

## 2022-04-02 ENCOUNTER — Ambulatory Visit: Payer: Medicare HMO | Attending: Internal Medicine | Admitting: Student

## 2022-04-02 ENCOUNTER — Other Ambulatory Visit (HOSPITAL_COMMUNITY): Payer: Self-pay

## 2022-04-02 ENCOUNTER — Telehealth: Payer: Self-pay

## 2022-04-02 NOTE — Telephone Encounter (Signed)
Pharmacy Patient Advocate Encounter  Prior Authorization for REPATHA 140 MG/ML INJ has been approved.    Effective dates: 01/19/22 through 01/19/23   Received notification from [facility] that prior authorization for REPATHA 140 MG/ML INJ is needed.    PA submitted on 04/02/22 Key [id number] Status is pending  Karie Soda, [place] Patient Advocate Specialist Direct Number: [phone] Fax: [phone]

## 2022-04-02 NOTE — Progress Notes (Signed)
Patient ID: Brandon Frank                 DOB: 1955/04/21                    MRN: [id number]      HPI: Brandon Frank is a 67 y.o. male patient referred to lipid clinic by Ambrose Pancoast, NP. PMH is significant for HTN, CAD, sinus tachycardia, PACs, PVCs, OSA, prostate CA, HLD.   Reviewed options for lowering LDL cholesterol, including ezetimibe, PCSK-9 inhibitors, bempedoic acid and inclisiran.  Discussed mechanisms of action, dosing, side effects and potential decreases in LDL cholesterol.  Also reviewed cost information and potential options for patient assistance.  Current Medications: rosuvastatin 40 mg, ezetimibe 10 mg  Intolerances:  Risk Factors: HTN, CAD LDL goal: <70  Last Lipid lab: LDLc: 72, TC: 133, TG: 163 , HDL: 40  Last A1c 8.2    Diet:   Exercise:   Family History:   Social History:   Labs: Lipid Panel     Component Value Date/Time   CHOL 140 10/29/2020 1023   TRIG 128 10/29/2020 1023   TRIG 88 11/19/2005 1012   HDL 45 10/29/2020 1023   CHOLHDL 3.1 10/29/2020 1023   CHOLHDL 4 10/01/2015 1104   VLDL 19.0 10/01/2015 1104   LDLCALC 72 10/29/2020 1023   LDLDIRECT 16 10/08/2016 0847   LDLDIRECT 168.2 10/29/2008 0813   LABVLDL 23 10/29/2020 1023    Past Medical History:  Diagnosis Date   Anxiety 02/03/2011   Atrial fibrillation and flutter ([place]) 07/13/2017   Bilateral cataracts    Bulging lumbar disc    5   Chronic low back pain    Closed fracture of coccyx (H[facility])    Degeneration of lumbar intervertebral disc    Degenerative lumbar spinal stenosis    Diarrhea    due to side effects from medications   Family history of premature CAD 03/20/2015   GERD (gastroesophageal reflux disease)    Hereditary and idiopathic peripheral neuropathy 12/06/2006   Qualifier: Diagnosis of  By: [name], [name]     History of bilateral inguinal hernias    at birth   History of colon polyps    Hyperlipidemia    Hypertension associated with diabetes ([place]) 04/01/2016    Infection of urinary tract 06/22/2015   Inguinal hernia    bilateral at birth   Insomnia 12/06/2006   Qualifier: Diagnosis of  By: [name], [name]     Lipoma of scalp 07/2003   lipoma of 11 x 35 mm   Low testosterone 10/07/2015   Lumbar radiculopathy    Mobitz type 1 second degree atrioventricular block    a. Event monitor 2017 - 1 beat.   Nonalcoholic fatty liver disease 11/24/2017   pt unaware   Numbness in feet    and cold   OA (osteoarthritis)    Obstructive sleep apnea 12/06/2013   NPSG 11/14/13- Severe OSA, AHI 86/ hr with loud snore, desat ot 80%, titrated to CPAP 12, weight 215 lbs    Overweight 10/23/2008   Qualifier: Diagnosis of  By: [name], MD, Caffie Damme     Paroxysmal atrial flutter [facility])    Plantar fasciitis of right foot 10/21/2012   resolved   Premature atrial contractions    Prostate cancer ([place])    PVC's (premature ventricular contractions)    Rotator cuff tear 11/2010   Right   Sigmoid diverticulosis 10/19/2017   Noted on colonoscopy  Sinus tachycardia 10/27/2013   Persistent mild sinus tachycardia, October, 2015   //   48 hour Holter, October, 2015, rare PACs and PVCs. Heart rate range 70-128, mean heart rate 95 during the daytime, mean heart rate 85 during the resting hours.   //   Patient was diagnosed with severe sleep apnea. C Pap was started and his resting sinus tachycardia improved greatly.    Spinal stenosis    TRANSAMINASES, SERUM, ELEVATED 11/30/2005   Qualifier: Diagnosis of  By: [name], [name]     Type 2 diabetes mellitus, uncontrolled 04/14/2012   URI (upper respiratory infection) 06/22/2015    Current Outpatient Medications on File Prior to Visit  Medication Sig Dispense Refill   acetaminophen (TYLENOL) 500 MG tablet Take 1,000 mg by mouth 2 (two) times daily as needed for moderate pain or headache.     apixaban (ELIQUIS) 5 MG TABS tablet Take 1 tablet (5 mg total) by mouth 2 (two) times daily. 180 tablet 2   BAYER MICROLET LANCETS  lancets USE   TO CHECK GLUCOSE TWICE DAILY 100 each 5   buprenorphine (SUBUTEX) 2 MG SUBL SL tablet 2 mg every 12 (twelve) hours.     buPROPion (WELLBUTRIN XL) 150 MG 24 hr tablet Take 150 mg by mouth daily.     calcium carbonate (TUMS - DOSED IN MG ELEMENTAL CALCIUM) 500 MG chewable tablet Chew 2 tablets by mouth daily as needed for indigestion or heartburn.      clonazePAM (KLONOPIN) 0.5 MG tablet Take 0.5 mg by mouth at bedtime.     CONTOUR NEXT TEST test strip USE 1 STRIP TO CHECK GLUCOSE ONCE DAILY 50 each 25   Dulaglutide 1.5 MG/0.5ML SOPN Inject 3 mg into the skin every Friday.     DULoxetine (CYMBALTA) 60 MG capsule TAKE 2 CAPSULES BY MOUTH EVERY OTHER DAY ALTERNATING WITH 3 CAPSULES EVERY OTHER DAY 225 capsule 0   eszopiclone (LUNESTA) 2 MG TABS tablet Take 2 mg by mouth at bedtime. Take immediately before bedtime     ezetimibe (ZETIA) 10 MG tablet Take 1 tablet (10 mg total) by mouth daily. 90 tablet 3   glipiZIDE (GLUCOTROL) 10 MG tablet Take 10 mg by mouth daily before breakfast.     glucose blood test strip One touch verio.  Test once daily. Dx E11.9 100 each 12   insulin glargine (LANTUS SOLOSTAR) 100 UNIT/ML Solostar Pen Inject 20 Units into the skin daily.     Lancets (ONETOUCH ULTRASOFT) lancets One touch verio lancets.  Test once daily.  Dx e11.9 100 each 12   LORazepam (ATIVAN) 1 MG tablet Take 1 tablet (1 mg total) by mouth 4 (four) times daily as needed for anxiety. 360 tablet 1   metFORMIN (GLUCOPHAGE) 500 MG tablet TAKE 2 TABLETS BY MOUTH TWICE DAILY WITH A MEAL. PLEASE SCHEDULE YOUR FOLLOW UP APPOINTMENT. [phone] 120 tablet 0   methocarbamol (ROBAXIN) 750 MG tablet Take 750 mg by mouth every 8 (eight) hours as needed for muscle spasms.     metoprolol succinate (TOPROL-XL) 50 MG 24 hr tablet Take 1 tablet (50 mg total) by mouth daily. 90 tablet 3   metoprolol tartrate (LOPRESSOR) 25 MG tablet Take 25 mg by mouth as directed. As needed for palpitations     Polyethyl  Glycol-Propyl Glycol (SYSTANE OP) Place 1 drop into both eyes daily as needed (dryness).     rosuvastatin (CRESTOR) 40 MG tablet Take 1 tablet (40 mg total) by mouth daily. 90 tablet 3  tadalafil (CIALIS) 5 MG tablet Take 5 mg by mouth daily.     tamsulosin (FLOMAX) 0.4 MG CAPS capsule Take 0.4 mg by mouth daily.     zaleplon (SONATA) 10 MG capsule Take 10 mg by mouth at bedtime as needed for sleep.     No current facility-administered medications on file prior to visit.    Allergies  Allergen Reactions   Flexeril [Cyclobenzaprine]     Assessment/Plan:  1. Hyperlipidemia -  No problems updated. No problem-specific Assessment & Plan notes found for this encounter.    Thank you,  Cammy Copa, Pharm.D [facility] Division of [facility] [address] [place], [place] [address]  Phone: [phone]; Fax: [phone]

## 2022-04-10 NOTE — Telephone Encounter (Signed)
N/A LVM   Repatha has been approved.

## 2022-04-10 NOTE — Telephone Encounter (Signed)
Pt returning call

## 2022-04-16 ENCOUNTER — Telehealth: Payer: Self-pay | Admitting: Pharmacist

## 2022-04-16 MED ORDER — APIXABAN 5 MG PO TABS: 5.0000 mg | ORAL_TABLET | Freq: Two times a day (BID) | ORAL | 0 refills | Status: DC

## 2022-04-16 NOTE — Telephone Encounter (Signed)
Initiated patient assistance from for Brandon Frank. Will leave form for patient at the front desk [place] office.  Also patient requested couple weeks of Eliquis samples until he figure out his new insurance  and co-pay for Eliquis.

## 2022-04-28 NOTE — Telephone Encounter (Signed)
Patient reports he does not qualify for patient assistance program for Repatha or Eliquis.

## 2022-04-30 ENCOUNTER — Other Ambulatory Visit: Payer: Self-pay

## 2022-04-30 MED ORDER — METOPROLOL SUCCINATE ER 50 MG PO TB24: 50.0000 mg | ORAL_TABLET | Freq: Every day | ORAL | 3 refills | Status: DC

## 2022-04-30 MED ORDER — EZETIMIBE 10 MG PO TABS: 10.0000 mg | ORAL_TABLET | Freq: Every day | ORAL | 3 refills | Status: DC

## 2022-04-30 MED ORDER — ROSUVASTATIN CALCIUM 40 MG PO TABS: 40.0000 mg | ORAL_TABLET | Freq: Every day | ORAL | 3 refills | Status: DC

## 2022-06-29 ENCOUNTER — Encounter: Payer: Self-pay | Admitting: Physical Therapy

## 2022-06-29 ENCOUNTER — Ambulatory Visit: Payer: Medicare Other | Attending: Anesthesiology | Admitting: Physical Therapy

## 2022-06-29 ENCOUNTER — Other Ambulatory Visit: Payer: Self-pay

## 2022-06-29 DIAGNOSIS — M6281 Muscle weakness (generalized): Secondary | ICD-10-CM | POA: Insufficient documentation

## 2022-06-29 DIAGNOSIS — M5459 Other low back pain: Secondary | ICD-10-CM | POA: Diagnosis present

## 2022-06-29 DIAGNOSIS — M25511 Pain in right shoulder: Secondary | ICD-10-CM | POA: Insufficient documentation

## 2022-06-29 DIAGNOSIS — R293 Abnormal posture: Secondary | ICD-10-CM | POA: Insufficient documentation

## 2022-06-29 NOTE — Therapy (Signed)
OUTPATIENT PHYSICAL THERAPY CERVICAL EVALUATION   Patient Name: Brandon Frank MRN: [id number] DOB:1955-07-08, 67 y.o., male Today's Date: 06/29/2022  END OF SESSION:  PT End of Session - 06/29/22 1500     Visit Number 1    Number of Visits 16    Date for PT Re-Evaluation 08/24/22    Authorization Type Medicare    PT Start Time 1405    PT Stop Time 1455    PT Time Calculation (min) 50 min    Activity Tolerance Patient tolerated treatment well             Past Medical History:  Diagnosis Date   Anxiety 02/03/2011   Atrial fibrillation and flutter (HCC) 07/13/2017   Bilateral cataracts    Bulging lumbar disc    5   Chronic low back pain    Closed fracture of coccyx (HCC)    Degeneration of lumbar intervertebral disc    Degenerative lumbar spinal stenosis    Diarrhea    due to side effects from medications   Family history of premature CAD 03/20/2015   GERD (gastroesophageal reflux disease)    Hereditary and idiopathic peripheral neuropathy 12/06/2006   Qualifier: Diagnosis of  By: Cato Mulligan MD, [name]     History of bilateral inguinal hernias    at birth   History of colon polyps    Hyperlipidemia    Hypertension associated with diabetes (HCC) 04/01/2016   Infection of urinary tract 06/22/2015   Inguinal hernia    bilateral at birth   Insomnia 12/06/2006   Qualifier: Diagnosis of  By: Cato Mulligan MD, [name]     Lipoma of scalp 07/2003   lipoma of 11 x 35 mm   Low testosterone 10/07/2015   Lumbar radiculopathy    Mobitz type 1 second degree atrioventricular block    a. Event monitor 2017 - 1 beat.   Nonalcoholic fatty liver disease 11/24/2017   pt unaware   Numbness in feet    and cold   OA (osteoarthritis)    Obstructive sleep apnea 12/06/2013   NPSG 11/14/13- Severe OSA, AHI 86/ hr with loud snore, desat ot 80%, titrated to CPAP 12, weight 215 lbs    Overweight 10/23/2008   Qualifier: Diagnosis of  By: Myrtis Ser, MD, Graciella Belton     Paroxysmal atrial flutter [facility])     Plantar fasciitis of right foot 10/21/2012   resolved   Premature atrial contractions    Prostate cancer (HCC)    PVC's (premature ventricular contractions)    Rotator cuff tear 11/2010   Right   Sigmoid diverticulosis 10/19/2017   Noted on colonoscopy   Sinus tachycardia 10/27/2013   Persistent mild sinus tachycardia, October, 2015   //   48 hour Holter, October, 2015, rare PACs and PVCs. Heart rate range 70-128, mean heart rate 95 during the daytime, mean heart rate 85 during the resting hours.   //   Patient was diagnosed with severe sleep apnea. C Pap was started and his resting sinus tachycardia improved greatly.    Spinal stenosis    TRANSAMINASES, SERUM, ELEVATED 11/30/2005   Qualifier: Diagnosis of  By: Cato Mulligan MD, [name]     Type 2 diabetes mellitus, uncontrolled 04/14/2012   URI (upper respiratory infection) 06/22/2015   Past Surgical History:  Procedure Laterality Date   ABLATION     tailbone twice   BIOPSY  10/19/2017   Procedure: BIOPSY;  Surgeon: Charna Elizabeth, MD;  Location: [facility];  Service: Endoscopy;;  COLONOSCOPY WITH PROPOFOL N/A 10/19/2017   Procedure: COLONOSCOPY WITH PROPOFOL;  Surgeon: Charna Elizabeth, MD;  Location: [facility];  Service: Endoscopy;  Laterality: N/A;   epidural steroid injection     Lower back   HERNIA REPAIR  1963   bilateral inguinal   NASAL SINUS SURGERY     POLYPECTOMY  10/19/2017   Procedure: POLYPECTOMY;  Surgeon: Charna Elizabeth, MD;  Location: [facility];  Service: Endoscopy;;   PROSTATE BIOPSY N/A    x2   RADIOACTIVE SEED IMPLANT N/A 01/04/2018   Procedure: RADIOACTIVE SEED IMPLANT BRACHYTHERAPY WITH FLEXIBLE CYSTOSCOPOY;  Surgeon: Crista Elliot, MD;  Location: [facility];  Service: Urology;  Laterality: N/A;   rotator cuff surgery Right 2012   Dr. Ranell Patrick   Patient Active Problem List   Diagnosis Date Noted   GAD (generalized anxiety disorder) 12/15/2017   MDD (major depressive disorder) 12/15/2017   Malignant neoplasm of  prostate (HCC) 09/28/2017   Atrial fibrillation and flutter (HCC) 07/13/2017   Hypertension associated with diabetes (HCC) 04/01/2016   Low testosterone 10/07/2015   Infection of urinary tract 06/22/2015   URI (upper respiratory infection) 06/22/2015   Family history of premature CAD 03/20/2015   Obstructive sleep apnea 12/06/2013   Sinus tachycardia 10/27/2013   Ejection fraction    Plantar fasciitis of right foot 10/21/2012   Type 2 diabetes mellitus, uncontrolled 04/14/2012   Anxiety 02/03/2011   Overweight 10/23/2008   Hereditary and idiopathic peripheral neuropathy 12/06/2006   Insomnia 12/06/2006   TRANSAMINASES, SERUM, ELEVATED 11/30/2005   Hyperlipidemia 11/29/2005    PCP: Gwenlyn Found, MD  REFERRING PROVIDER: Gladis Riffle, MD  REFERRING DIAG: [id number] (ICD-10-CM) - Radiculopathy, cervical region R29.898 (ICD-10-CM) - Other symptoms and signs involving the musculoskeletal system  THERAPY DIAG:  Muscle weakness (generalized)  Abnormal posture  Acute pain of right shoulder  Other low back pain  Rationale for Evaluation and Treatment: Rehabilitation  ONSET DATE: March 2024 for his neck; chronic back pain (~10 years)  SUBJECTIVE:                                                                                                                                                                                                         SUBJECTIVE STATEMENT: Pt states he came here 6-7 years ago for L4-L5 bulging disc. Pt reports he got a bunch of exercises but slowly stopped doing them. Pt ended up getting surgery but got an infection in 2020. Pt notes history of broken coccyx 15-20 years ago with ablation. Pt just had ablations  performed on low back a few months ago but it has not really gotten fully better since the infection. Pt would like to work on improving his pain with gardening/bending tasks. In regards to his neck, he states in March he got a massage  and noticed increased pain when the massage therapist worked on his scapular muscles (he points primarily to his subscapularis). Since then, he has noticed increased hand weakness and was diagnosed with a pinched nerve. Pt had MRI performed which showed severe foraminal narrowing particularly in lower cervical spine. Pt got a shot/epidural which helped a little bit in getting more strength in his hand and he has plans to get another shot. He reports he did a few visits of PT for his neck and was given a few stretches which have helped. He does not note a lot of neck pain but primarily R shoulder pain.  Hand dominance: Right  PERTINENT HISTORY:  R rotator cuff surgery ~12 years  PAIN:  Are you having pain? Yes: NPRS scale: 0 currently/10 Pain location: Top of right shoulder Pain description: Aching Aggravating factors: Rotating neck to the right he can feel it in his right shoulder Relieving factors: Rest  Are you having pain? Yes: NPRS scale: 4 currently, at worst 8/10 Pain location: Center low back Pain description: aching Aggravating factors: Gardening, bending over Relieving factors: Pain medication, ablations   PRECAUTIONS: None  WEIGHT BEARING RESTRICTIONS: No  FALLS:  Has patient fallen in last 6 months? No and Yes. Number of falls 2; 3 weeks ago most recent fall due to syncope when getting up  LIVING ENVIRONMENT: Lives with: lives with their spouse Lives in: House/apartment Stairs: Yes: Internal: 10 steps; none  OCCUPATION: Retired -- likes to garden and a lot of outside work  PLOF: Independent  PATIENT GOALS: Pt would like to work on right hand strength, review exercises for the low back  NEXT MD VISIT: n/a  OBJECTIVE:   DIAGNOSTIC FINDINGS:  MRI cervical spine 05/13/22 but unable to see on Epic (pt has it on his [facility] MyChart)  PATIENT SURVEYS:  Quick Dash 59.1%  COGNITION: Overall cognitive status: Within functional limits for tasks  assessed  SENSATION: N/T in the pinky intermittently  POSTURE: rounded shoulders and increased thoracic kyphosis  PALPATION: Hypomobile lower c-spine but no overt muscle tension in cervical or thoracic    CERVICAL ROM:   Active ROM A/PROM (deg) eval  Flexion 45  Extension 37 * (pulls into shoulder)  Right lateral flexion 20 *  Left lateral flexion 27  Right rotation 47 *  Left rotation 55   (Blank rows = not tested)  UPPER EXTREMITY ROM: WFL  Active ROM Right eval Left eval  Shoulder flexion    Shoulder extension    Shoulder abduction    Shoulder adduction    Shoulder extension    Shoulder internal rotation    Shoulder external rotation    Elbow flexion    Elbow extension    Wrist flexion    Wrist extension    Wrist ulnar deviation    Wrist radial deviation    Wrist pronation    Wrist supination     (Blank rows = not tested)  UPPER EXTREMITY MMT:  MMT Right eval Left eval  Shoulder flexion 5 5  Shoulder extension 3+ 5  Shoulder abduction 4 5  Shoulder adduction    Shoulder internal rotation 5 5  Shoulder external rotation 3+ 5  Middle trapezius 3+ 4  Lower trapezius  3+ 4  Elbow flexion    Elbow extension    Wrist flexion 5 5  Wrist extension 3+ 5  Wrist ulnar deviation    Wrist radial deviation    Wrist pronation    Wrist supination    Grip strength 35, 40, 33 (with compensatory wrist flexion) 100, 95, 93   (Blank rows = not tested)  CERVICAL SPECIAL TESTS:  Roo's test (-)  FUNCTIONAL TESTS:  Did not assess  TODAY'S TREATMENT:                                                                                                                              DATE: 06/29/22 See HEP below    PATIENT EDUCATION:  Education details: Exam findings, POC, initial HEP Person educated: Patient Education method: Explanation, Demonstration, and Handouts Education comprehension: verbalized understanding, returned demonstration, and needs further  education  HOME EXERCISE PROGRAM: Access Code: [email or web address] URL: [email or web address] Date: 06/29/2022 Prepared by: Vernon Prey April Kirstie Peri  Exercises - Doorway Pec Stretch at 90 Degrees Abduction  - 1 x daily - 7 x weekly - 2 sets - 30 sec hold - Doorway Pec Stretch at 60 Elevation  - 1 x daily - 7 x weekly - 2 sets - 30 sec hold - Corner Pec Minor Stretch  - 1 x daily - 7 x weekly - 2 sets - 30 sec hold - Shoulder External Rotation and Scapular Retraction with Resistance  - 1 x daily - 7 x weekly - 2 sets - 10 reps - Shoulder W - External Rotation with Resistance  - 1 x daily - 7 x weekly - 2 sets - 10 reps  ASSESSMENT:  CLINICAL IMPRESSION: Patient is a 67 y.o. M who was seen today for physical therapy evaluation and treatment for cervical and lumbar spine. Pt will need further assessment of his lumbar spine on the next session. Spent majority of this evaluation to assess his C-spine and R UE. Assessment significant for R hand/wrist weakness, R>L posterior shoulder girdle weakness, abnormal posture and C7-C8 radicular symptoms (affected myotome and dermatomes). Pt would benefit from PT to address these issues for improved ability to use his R UE for home and yard tasks.   OBJECTIVE IMPAIRMENTS: decreased activity tolerance, decreased mobility, decreased ROM, decreased strength, hypomobility, impaired sensation, impaired UE functional use, improper body mechanics, and pain.   ACTIVITY LIMITATIONS: carrying, lifting, bending, sleeping, bathing, toileting, dressing, and caring for others  PARTICIPATION LIMITATIONS: meal prep, cleaning, driving, community activity, and yard work  PERSONAL FACTORS: Fitness, Past/current experiences, and Time since onset of injury/illness/exacerbation are also affecting patient's functional outcome.   REHAB POTENTIAL: Good  CLINICAL DECISION MAKING: Evolving/moderate complexity  EVALUATION COMPLEXITY: Moderate   GOALS: Goals reviewed  with patient? Yes  SHORT TERM GOALS: Target date: 07/27/2022   Pt will be ind with initial HEP Baseline:  Goal status: INITIAL  2.  Pt will demo improved R grip strength to >/=50#  Baseline:  Goal status: INITIAL  3.  Pt will demo L = R cervical ROM Baseline:  Goal status: INITIAL   LONG TERM GOALS: Target date: 08/24/2022   Pt will be ind with management and progression of HEP Baseline:  Goal status: INITIAL  2.  Pt will report >/=50% decrease in low back pain with bending/gardening tasks Baseline:  Goal status: INITIAL  3.  Pt will have improved R hand grip strength to >/=70# for grasping tasks Baseline:  Goal status: INITIAL  4.  Pt will report no pain with all cervical motions Baseline:  Goal status: INITIAL  5.  Pt will have improved Quick DASH to </=49.1% to demo MCID Baseline:  Goal status: INITIAL  6.  Pt will have improved modified Oswestry score by >/=10% from initial intake to demo MCID Baseline:  Goal status: INITIAL   PLAN:  PT FREQUENCY: 2x/week  PT DURATION: 8 weeks  PLANNED INTERVENTIONS: Therapeutic exercises, Therapeutic activity, Neuromuscular re-education, Balance training, Gait training, Patient/Family education, Self Care, Joint mobilization, Aquatic Therapy, Dry Needling, Electrical stimulation, Spinal mobilization, Cryotherapy, Moist heat, Taping, Traction, Manual therapy, and Re-evaluation  PLAN FOR NEXT SESSION: Assess lumbar spine/LEs. Review/modify HEP as indicated. Progress posterior shoulder strengthening. Perform modified oswestry. Consider trial of mechanical traction for neck stenosis.    Dashay Giesler April Ma L Annarae Macnair, PT 06/29/2022, 3:28 PM

## 2022-07-01 ENCOUNTER — Encounter: Payer: Self-pay | Admitting: Physical Therapy

## 2022-07-07 ENCOUNTER — Ambulatory Visit: Payer: Medicare Other

## 2022-07-09 ENCOUNTER — Ambulatory Visit: Payer: Self-pay | Admitting: Rehabilitative and Restorative Service Providers"

## 2022-07-13 ENCOUNTER — Encounter: Payer: Self-pay | Admitting: Physical Therapy

## 2022-07-13 ENCOUNTER — Ambulatory Visit: Payer: Medicare Other | Admitting: Physical Therapy

## 2022-07-13 DIAGNOSIS — M6281 Muscle weakness (generalized): Secondary | ICD-10-CM | POA: Diagnosis not present

## 2022-07-13 DIAGNOSIS — M25511 Pain in right shoulder: Secondary | ICD-10-CM

## 2022-07-13 DIAGNOSIS — M5459 Other low back pain: Secondary | ICD-10-CM

## 2022-07-13 DIAGNOSIS — R293 Abnormal posture: Secondary | ICD-10-CM

## 2022-07-13 NOTE — Therapy (Signed)
OUTPATIENT PHYSICAL THERAPY LUMBAR/CERVICAL TREATMENT NOTE   Patient Name: Brandon Frank MRN: [id number] DOB:10-08-1955, 67 y.o., male Today's Date: 07/13/2022  END OF SESSION:  PT End of Session - 07/13/22 1017     Visit Number 2    Number of Visits 16    Date for PT Re-Evaluation 08/24/22    Authorization Type Medicare    PT Start Time 1017    PT Stop Time 1103    PT Time Calculation (min) 46 min    Activity Tolerance Patient tolerated treatment well    Behavior During Therapy [facility] for tasks assessed/performed              Past Medical History:  Diagnosis Date   Anxiety 02/03/2011   Atrial fibrillation and flutter (HCC) 07/13/2017   Bilateral cataracts    Bulging lumbar disc    5   Chronic low back pain    Closed fracture of coccyx (HCC)    Degeneration of lumbar intervertebral disc    Degenerative lumbar spinal stenosis    Diarrhea    due to side effects from medications   Family history of premature CAD 03/20/2015   GERD (gastroesophageal reflux disease)    Hereditary and idiopathic peripheral neuropathy 12/06/2006   Qualifier: Diagnosis of  By: Cato Mulligan MD, [name]     History of bilateral inguinal hernias    at birth   History of colon polyps    Hyperlipidemia    Hypertension associated with diabetes (HCC) 04/01/2016   Infection of urinary tract 06/22/2015   Inguinal hernia    bilateral at birth   Insomnia 12/06/2006   Qualifier: Diagnosis of  By: Cato Mulligan MD, [name]     Lipoma of scalp 07/2003   lipoma of 11 x 35 mm   Low testosterone 10/07/2015   Lumbar radiculopathy    Mobitz type 1 second degree atrioventricular block    a. Event monitor 2017 - 1 beat.   Nonalcoholic fatty liver disease 11/24/2017   pt unaware   Numbness in feet    and cold   OA (osteoarthritis)    Obstructive sleep apnea 12/06/2013   NPSG 11/14/13- Severe OSA, AHI 86/ hr with loud snore, desat ot 80%, titrated to CPAP 12, weight 215 lbs    Overweight 10/23/2008   Qualifier: Diagnosis of   By: Myrtis Ser, MD, Graciella Belton     Paroxysmal atrial flutter [facility])    Plantar fasciitis of right foot 10/21/2012   resolved   Premature atrial contractions    Prostate cancer (HCC)    PVC's (premature ventricular contractions)    Rotator cuff tear 11/2010   Right   Sigmoid diverticulosis 10/19/2017   Noted on colonoscopy   Sinus tachycardia 10/27/2013   Persistent mild sinus tachycardia, October, 2015   //   48 hour Holter, October, 2015, rare PACs and PVCs. Heart rate range 70-128, mean heart rate 95 during the daytime, mean heart rate 85 during the resting hours.   //   Patient was diagnosed with severe sleep apnea. C Pap was started and his resting sinus tachycardia improved greatly.    Spinal stenosis    TRANSAMINASES, SERUM, ELEVATED 11/30/2005   Qualifier: Diagnosis of  By: Cato Mulligan MD, [name]     Type 2 diabetes mellitus, uncontrolled 04/14/2012   URI (upper respiratory infection) 06/22/2015   Past Surgical History:  Procedure Laterality Date   ABLATION     tailbone twice   BIOPSY  10/19/2017   Procedure: BIOPSY;  Surgeon:  Charna Elizabeth, MD;  Location: [facility];  Service: Endoscopy;;   COLONOSCOPY WITH PROPOFOL N/A 10/19/2017   Procedure: COLONOSCOPY WITH PROPOFOL;  Surgeon: Charna Elizabeth, MD;  Location: [facility];  Service: Endoscopy;  Laterality: N/A;   epidural steroid injection     Lower back   HERNIA REPAIR  1963   bilateral inguinal   NASAL SINUS SURGERY     POLYPECTOMY  10/19/2017   Procedure: POLYPECTOMY;  Surgeon: Charna Elizabeth, MD;  Location: [facility];  Service: Endoscopy;;   PROSTATE BIOPSY N/A    x2   RADIOACTIVE SEED IMPLANT N/A 01/04/2018   Procedure: RADIOACTIVE SEED IMPLANT BRACHYTHERAPY WITH FLEXIBLE CYSTOSCOPOY;  Surgeon: Crista Elliot, MD;  Location: [facility];  Service: Urology;  Laterality: N/A;   rotator cuff surgery Right 2012   Dr. Ranell Patrick   Patient Active Problem List   Diagnosis Date Noted   GAD (generalized anxiety disorder) 12/15/2017    MDD (major depressive disorder) 12/15/2017   Malignant neoplasm of prostate (HCC) 09/28/2017   Atrial fibrillation and flutter (HCC) 07/13/2017   Hypertension associated with diabetes (HCC) 04/01/2016   Low testosterone 10/07/2015   Infection of urinary tract 06/22/2015   URI (upper respiratory infection) 06/22/2015   Family history of premature CAD 03/20/2015   Obstructive sleep apnea 12/06/2013   Sinus tachycardia 10/27/2013   Ejection fraction    Plantar fasciitis of right foot 10/21/2012   Type 2 diabetes mellitus, uncontrolled 04/14/2012   Anxiety 02/03/2011   Overweight 10/23/2008   Hereditary and idiopathic peripheral neuropathy 12/06/2006   Insomnia 12/06/2006   TRANSAMINASES, SERUM, ELEVATED 11/30/2005   Hyperlipidemia 11/29/2005    PCP: Gwenlyn Found, MD  REFERRING PROVIDER: Gladis Riffle, MD  REFERRING DIAG: [phone] (ICD-10-CM) - Radiculopathy, cervical region R29.898 (ICD-10-CM) - Other symptoms and signs involving the musculoskeletal system  THERAPY DIAG:  Muscle weakness (generalized)  Abnormal posture  Acute pain of right shoulder  Other low back pain  Rationale for Evaluation and Treatment: Rehabilitation  ONSET DATE: March 2024 for his neck; chronic back pain (~10 years)  SUBJECTIVE:                                                                                                                                                                                                         SUBJECTIVE STATEMENT: The HEP is going well - doing most days of the week.  I got some shoulder pulleys and have been working on stretching my shoulders.  My lower back feels pretty good unless I do a lot of  yardwork.  I rest for about a day and then it recovers.    Eval: Pt states he came here 6-7 years ago for L4-L5 bulging disc. Pt reports he got a bunch of exercises but slowly stopped doing them. Pt ended up getting surgery but got an infection in  2020. Pt notes history of broken coccyx 15-20 years ago with ablation. Pt just had ablations performed on low back a few months ago but it has not really gotten fully better since the infection. Pt would like to work on improving his pain with gardening/bending tasks. In regards to his neck, he states in March he got a massage and noticed increased pain when the massage therapist worked on his scapular muscles (he points primarily to his subscapularis). Since then, he has noticed increased hand weakness and was diagnosed with a pinched nerve. Pt had MRI performed which showed severe foraminal narrowing particularly in lower cervical spine. Pt got a shot/epidural which helped a little bit in getting more strength in his hand and he has plans to get another shot. He reports he did a few visits of PT for his neck and was given a few stretches which have helped. He does not note a lot of neck pain but primarily R shoulder pain.  Hand dominance: Right  PERTINENT HISTORY:  R rotator cuff surgery ~12 years  PAIN:  Are you having pain? Yes: NPRS scale: 0 currently/10 Pain location: Top of right shoulder Pain description: Aching Aggravating factors: Rotating neck to the right he can feel it in his right shoulder Relieving factors: Rest  Are you having pain? Yes: NPRS scale: 4 currently, at worst 8/10 Pain location: Center low back Pain description: aching Aggravating factors: Gardening, bending over Relieving factors: Pain medication, ablations   PRECAUTIONS: None  WEIGHT BEARING RESTRICTIONS: No  FALLS:  Has patient fallen in last 6 months? No and Yes. Number of falls 2; 3 weeks ago most recent fall due to syncope when getting up  LIVING ENVIRONMENT: Lives with: lives with their spouse Lives in: House/apartment Stairs: Yes: Internal: 10 steps; none  OCCUPATION: Retired -- likes to garden and a lot of outside work  PLOF: Independent  PATIENT GOALS: Pt would like to work on right hand  strength, review exercises for the low back  NEXT MD VISIT: n/a  OBJECTIVE:   DIAGNOSTIC FINDINGS:  MRI cervical spine 05/13/22 but unable to see on Epic (pt has it on his [facility] My[facility])  PATIENT SURVEYS:  Quick Dash 59.1% 07/13/22: ODI 14/50  COGNITION: Overall cognitive status: Within functional limits for tasks assessed  SENSATION: N/T in the pinky intermittently  POSTURE:  rounded shoulders and increased thoracic kyphosis Decreased lumbar lordosis   PALPATION: Hypomobile lower c-spine but no overt muscle tension in cervical or thoracic Tightness present in bil lumbar multifidi, non-tender Tightness present in bil gluteals and piriformis, non-tender    CERVICAL ROM:   Active ROM A/PROM (deg) eval  Flexion 45  Extension 37 * (pulls into shoulder)  Right lateral flexion 20 *  Left lateral flexion 27  Right rotation 47 *  Left rotation 55   (Blank rows = not tested)  UPPER EXTREMITY ROM: WFL  Active ROM Right eval Left eval  Shoulder flexion    Shoulder extension    Shoulder abduction    Shoulder adduction    Shoulder extension    Shoulder internal rotation    Shoulder external rotation    Elbow flexion    Elbow extension  Wrist flexion    Wrist extension    Wrist ulnar deviation    Wrist radial deviation    Wrist pronation    Wrist supination     (Blank rows = not tested)  UPPER EXTREMITY MMT:  MMT Right eval Left eval  Shoulder flexion 5 5  Shoulder extension 3+ 5  Shoulder abduction 4 5  Shoulder adduction    Shoulder internal rotation 5 5  Shoulder external rotation 3+ 5  Middle trapezius 3+ 4  Lower trapezius 3+ 4  Elbow flexion    Elbow extension    Wrist flexion 5 5  Wrist extension 3+ 5  Wrist ulnar deviation    Wrist radial deviation    Wrist pronation    Wrist supination    Grip strength 35, 40, 33 (with compensatory wrist flexion) 100, 95, 93   (Blank rows = not tested)  LUMBAR ROM:   Active ROM A/PROM (deg) eval   Flexion Fingers to ground no pain  Extension 12 slight pain  Right lateral flexion Full no pain  Left lateral flexion Full no pain  Right rotation 75% no pain  Left rotation 75% no pain    LE P/ROM:  Left: limited hip IR 5 deg Right: limited hip IR 5 deg   LE MMT: Left: 5/5 Right: 5/5   LE FLEXIBILITY:  End range tightness bil hamstrings, quads, hip flexors, QL, adductors  CERVICAL SPECIAL TESTS:  Roo's test (-) 07/13/22:  SLR negative  FUNCTIONAL TESTS:  Did not assess    TODAY'S TREATMENT:                                                                                                                              DATE:  07/13/22: Verbal review of HEP from initial eval  ODI intake survey 14/50 Objective assessment of lumbar and LE ROM, MMT, flexiblity Body mechanics assessment holding 10lb kbell: squat and deadlift Supine HS stretch bil hold back of thigh x 30" Supine butterfly x 30" LTR 10x5" Attempted hip flexor/quad stretch off edge of table, but switched to kneeling hip flexor stretch x30" bil HEP progression for LE stretching Discussion of findings Grip strength Rt UE per Pt request end of session: 42lb  06/29/22 See HEP below    PATIENT EDUCATION:  Education details: Exam findings, POC, initial HEP Person educated: Patient Education method: Explanation, Demonstration, and Handouts Education comprehension: verbalized understanding, returned demonstration, and needs further education  HOME EXERCISE PROGRAM: Access Code: [email or web address] URL: [email or web address] Date: 07/13/2022 Prepared by: Loistine Simas Elan Mcelvain  Exercises - Doorway Pec Stretch at 90 Degrees Abduction  - 1 x daily - 7 x weekly - 2 sets - 30 sec hold - Doorway Pec Stretch at 60 Elevation  - 1 x daily - 7 x weekly - 2 sets - 30 sec hold - Corner Pec Minor Stretch  - 1 x daily - 7 x weekly - 2 sets - 30 sec hold - Shoulder  External Rotation and Scapular Retraction with Resistance  -  1 x daily - 7 x weekly - 2 sets - 10 reps - Shoulder W - External Rotation with Resistance  - 1 x daily - 7 x weekly - 2 sets - 10 reps - Supine Hamstring Stretch  - 1 x daily - 7 x weekly - 1 sets - 2 reps - 30 hold - Supine Butterfly Groin Stretch  - 1 x daily - 7 x weekly - 1 sets - 2 reps - 30 hold - Supine Lower Trunk Rotation  - 1 x daily - 7 x weekly - 1 sets - 10 reps - 5 hold - Half Kneeling Hip Flexor Stretch with Sidebend  - 1 x daily - 7 x weekly - 1 sets - 2 reps - 30 hold  ASSESSMENT:  CLINICAL IMPRESSION: Session focused on lumbar spine today.  ODI intake is 14/50, placing him in the mild/mod disability category.  Pt reports his back mostly will bother him after a lot of yard/garden work.  He will need to rest a day after that and it will recover.   Pt demos very good trunk ROM without pain.  He has limited hip IR but all other hip ROM is WNL and painfree.  He has end range flexibility limitations bil throughout hips and thighs.  His body mechanics under low load looked good for both squat and deadlift.  Pt has tightness in lumbar multifidi and lateral/posterior hips that will likely benefit from some DN to complement more functional strength training of lumbar spine to meet demands of yardwork.   Pt continues to have signif asymmetry in Rt dominant UE grip strength compared to Lt.  This is new onset approx 6 weeks ago.  Discussed that he may benefit from a NCV/EMG.  OBJECTIVE IMPAIRMENTS: decreased activity tolerance, decreased mobility, decreased ROM, decreased strength, hypomobility, impaired sensation, impaired UE functional use, improper body mechanics, and pain.   ACTIVITY LIMITATIONS: carrying, lifting, bending, sleeping, bathing, toileting, dressing, and caring for others  PARTICIPATION LIMITATIONS: meal prep, cleaning, driving, community activity, and yard work  PERSONAL FACTORS: Fitness, Past/current experiences, and Time since onset of injury/illness/exacerbation are also  affecting patient's functional outcome.   REHAB POTENTIAL: Good  CLINICAL DECISION MAKING: Evolving/moderate complexity  EVALUATION COMPLEXITY: Moderate   GOALS: Goals reviewed with patient? Yes  SHORT TERM GOALS: Target date: 07/27/2022   Pt will be ind with initial HEP Baseline:  Goal status: ongoing  2.  Pt will demo improved R grip strength to >/=50# Baseline:  Goal status: INITIAL  3.  Pt will demo L = R cervical ROM Baseline:  Goal status: INITIAL   LONG TERM GOALS: Target date: 08/24/2022   Pt will be ind with management and progression of HEP Baseline:  Goal status: INITIAL  2.  Pt will report >/=50% decrease in low back pain with bending/gardening tasks Baseline:  Goal status: INITIAL  3.  Pt will have improved R hand grip strength to >/=70# for grasping tasks Baseline:  Goal status: INITIAL  4.  Pt will report no pain with all cervical motions Baseline:  Goal status: INITIAL  5.  Pt will have improved Quick DASH to </=49.1% to demo MCID Baseline:  Goal status: INITIAL  6.  Pt will have improved modified Oswestry score by >/=10% from initial intake to demo MCID Baseline:  Goal status: INITIAL   PLAN:  PT FREQUENCY: 2x/week  PT DURATION: 8 weeks  PLANNED INTERVENTIONS: Therapeutic exercises, Therapeutic activity,  Neuromuscular re-education, Balance training, Gait training, Patient/Family education, Self Care, Joint mobilization, Aquatic Therapy, Dry Needling, Electrical stimulation, Spinal mobilization, Cryotherapy, Moist heat, Taping, Traction, Manual therapy, and Re-evaluation  PLAN FOR NEXT SESSION:  Review/modify HEP as indicated. For lumbar spine: DN lumbar multifidi and lateral hips and progress loaded deadlifts and squats, maybe try standing T with kbell.  Progress posterior shoulder strengthening. Consider trial of mechanical traction for neck stenosis. Consider note to MD regarding Rt UE weakness and see if they may want to order NCV/EMG  given new onset of weakness over last 6 weeks.  Nyjah Denio, PT 07/13/22 1:37 PM

## 2022-07-20 ENCOUNTER — Ambulatory Visit: Payer: Medicare Other | Attending: Anesthesiology | Admitting: Physical Therapy

## 2022-07-20 DIAGNOSIS — R293 Abnormal posture: Secondary | ICD-10-CM | POA: Diagnosis present

## 2022-07-20 DIAGNOSIS — M5459 Other low back pain: Secondary | ICD-10-CM | POA: Insufficient documentation

## 2022-07-20 DIAGNOSIS — M25511 Pain in right shoulder: Secondary | ICD-10-CM | POA: Diagnosis present

## 2022-07-20 DIAGNOSIS — M6281 Muscle weakness (generalized): Secondary | ICD-10-CM | POA: Diagnosis present

## 2022-07-20 NOTE — Therapy (Signed)
OUTPATIENT PHYSICAL THERAPY LUMBAR/CERVICAL TREATMENT NOTE   Patient Name: Brandon Frank MRN: [id number] DOB:[birth year], 67 y.o., male Today's Date: 07/20/2022  END OF SESSION:  PT End of Session - 07/20/22 1404     Visit Number 3    Number of Visits 16    Date for PT Re-Evaluation 08/24/22    Authorization Type Medicare    PT Start Time 1405    PT Stop Time 1445    PT Time Calculation (min) 40 min    Activity Tolerance Patient tolerated treatment well    Behavior During Therapy [facility] for tasks assessed/performed              Past Medical History:  Diagnosis Date   Anxiety 02/03/2011   Atrial fibrillation and flutter (HCC) 07/13/2017   Bilateral cataracts    Bulging lumbar disc    5   Chronic low back pain    Closed fracture of coccyx (HCC)    Degeneration of lumbar intervertebral disc    Degenerative lumbar spinal stenosis    Diarrhea    due to side effects from medications   Family history of premature CAD 03/20/2015   GERD (gastroesophageal reflux disease)    Hereditary and idiopathic peripheral neuropathy 12/06/2006   Qualifier: Diagnosis of  By: Cato Mulligan MD, [name]     History of bilateral inguinal hernias    at birth   History of colon polyps    Hyperlipidemia    Hypertension associated with diabetes (HCC) 04/01/2016   Infection of urinary tract 06/22/2015   Inguinal hernia    bilateral at birth   Insomnia 12/06/2006   Qualifier: Diagnosis of  By: Cato Mulligan MD, [name]     Lipoma of scalp 07/2003   lipoma of 11 x 35 mm   Low testosterone 10/07/2015   Lumbar radiculopathy    Mobitz type 1 second degree atrioventricular block    a. Event monitor 2017 - 1 beat.   Nonalcoholic fatty liver disease 11/24/2017   pt unaware   Numbness in feet    and cold   OA (osteoarthritis)    Obstructive sleep apnea 12/06/2013   NPSG 11/14/13- Severe OSA, AHI 86/ hr with loud snore, desat ot 80%, titrated to CPAP 12, weight 215 lbs    Overweight 10/23/2008   Qualifier: Diagnosis of   By: Myrtis Ser, MD, Graciella Belton     Paroxysmal atrial flutter [facility])    Plantar fasciitis of right foot 10/21/2012   resolved   Premature atrial contractions    Prostate cancer (HCC)    PVC's (premature ventricular contractions)    Rotator cuff tear 11/2010   Right   Sigmoid diverticulosis 10/19/2017   Noted on colonoscopy   Sinus tachycardia 10/27/2013   Persistent mild sinus tachycardia, October, 2015   //   48 hour Holter, October, 2015, rare PACs and PVCs. Heart rate range 70-128, mean heart rate 95 during the daytime, mean heart rate 85 during the resting hours.   //   Patient was diagnosed with severe sleep apnea. C Pap was started and his resting sinus tachycardia improved greatly.    Spinal stenosis    TRANSAMINASES, SERUM, ELEVATED 11/30/2005   Qualifier: Diagnosis of  By: Cato Mulligan MD, [name]     Type 2 diabetes mellitus, uncontrolled 04/14/2012   URI (upper respiratory infection) 06/22/2015   Past Surgical History:  Procedure Laterality Date   ABLATION     tailbone twice   BIOPSY  10/19/2017   Procedure: BIOPSY;  Surgeon:  Charna Elizabeth, MD;  Location: [facility];  Service: Endoscopy;;   COLONOSCOPY WITH PROPOFOL N/A 10/19/2017   Procedure: COLONOSCOPY WITH PROPOFOL;  Surgeon: Charna Elizabeth, MD;  Location: [facility];  Service: Endoscopy;  Laterality: N/A;   epidural steroid injection     Lower back   HERNIA REPAIR  1963   bilateral inguinal   NASAL SINUS SURGERY     POLYPECTOMY  10/19/2017   Procedure: POLYPECTOMY;  Surgeon: Charna Elizabeth, MD;  Location: [facility];  Service: Endoscopy;;   PROSTATE BIOPSY N/A    x2   RADIOACTIVE SEED IMPLANT N/A 01/04/2018   Procedure: RADIOACTIVE SEED IMPLANT BRACHYTHERAPY WITH FLEXIBLE CYSTOSCOPOY;  Surgeon: Crista Elliot, MD;  Location: [facility];  Service: Urology;  Laterality: N/A;   rotator cuff surgery Right 2012   Dr. Ranell Patrick   Patient Active Problem List   Diagnosis Date Noted   GAD (generalized anxiety disorder) 12/15/2017    MDD (major depressive disorder) 12/15/2017   Malignant neoplasm of prostate (HCC) 09/28/2017   Atrial fibrillation and flutter (HCC) 07/13/2017   Hypertension associated with diabetes (HCC) 04/01/2016   Low testosterone 10/07/2015   Infection of urinary tract 06/22/2015   URI (upper respiratory infection) 06/22/2015   Family history of premature CAD 03/20/2015   Obstructive sleep apnea 12/06/2013   Sinus tachycardia 10/27/2013   Ejection fraction    Plantar fasciitis of right foot 10/21/2012   Type 2 diabetes mellitus, uncontrolled 04/14/2012   Anxiety 02/03/2011   Overweight 10/23/2008   Hereditary and idiopathic peripheral neuropathy 12/06/2006   Insomnia 12/06/2006   TRANSAMINASES, SERUM, ELEVATED 11/30/2005   Hyperlipidemia 11/29/2005    PCP: Gwenlyn Found, MD  REFERRING PROVIDER: Gladis Riffle, MD  REFERRING DIAG: [phone] (ICD-10-CM) - Radiculopathy, cervical region R29.898 (ICD-10-CM) - Other symptoms and signs involving the musculoskeletal system  THERAPY DIAG:  Muscle weakness (generalized)  Abnormal posture  Acute pain of right shoulder  Other low back pain  Rationale for Evaluation and Treatment: Rehabilitation  ONSET DATE: March 2024 for his neck; chronic back pain (~10 years)  SUBJECTIVE:                                                                                                                                                                                                         SUBJECTIVE STATEMENT: Pt states he had the shots but didn't think they did much. Feels that the strength in his hand has not improved. Pt walks into clinic stating he feels very hot. Pt does note some pain in top of  shoulder with yard work. Pt reports low back has had some pain.   Eval: Pt states he came here 6-7 years ago for L4-L5 bulging disc. Pt reports he got a bunch of exercises but slowly stopped doing them. Pt ended up getting surgery but got an  infection in 2020. Pt notes history of broken coccyx 15-20 years ago with ablation. Pt just had ablations performed on low back a few months ago but it has not really gotten fully better since the infection. Pt would like to work on improving his pain with gardening/bending tasks. In regards to his neck, he states in March he got a massage and noticed increased pain when the massage therapist worked on his scapular muscles (he points primarily to his subscapularis). Since then, he has noticed increased hand weakness and was diagnosed with a pinched nerve. Pt had MRI performed which showed severe foraminal narrowing particularly in lower cervical spine. Pt got a shot/epidural which helped a little bit in getting more strength in his hand and he has plans to get another shot. He reports he did a few visits of PT for his neck and was given a few stretches which have helped. He does not note a lot of neck pain but primarily R shoulder pain.  Hand dominance: Right  PERTINENT HISTORY:  R rotator cuff surgery ~12 years  PAIN:  Are you having pain? Yes: NPRS scale: 0 currently/10 Pain location: Top of right shoulder Pain description: Aching Aggravating factors: Rotating neck to the right he can feel it in his right shoulder Relieving factors: Rest  Are you having pain? Yes: NPRS scale: 4 currently, at worst 8/10 Pain location: Center low back Pain description: aching Aggravating factors: Gardening, bending over Relieving factors: Pain medication, ablations   PRECAUTIONS: None  WEIGHT BEARING RESTRICTIONS: No  FALLS:  Has patient fallen in last 6 months? No and Yes. Number of falls 2; 3 weeks ago most recent fall due to syncope when getting up  LIVING ENVIRONMENT: Lives with: lives with their spouse Lives in: House/apartment Stairs: Yes: Internal: 10 steps; none  OCCUPATION: Retired -- likes to garden and a lot of outside work  PLOF: Independent  PATIENT GOALS: Pt would like to work on  right hand strength, review exercises for the low back  NEXT MD VISIT: n/a  OBJECTIVE:   VITALS 137/93 BP, 88 BPM sitting at rest on initial presentation  DIAGNOSTIC FINDINGS:  MRI cervical spine 05/13/22 but unable to see on Epic (pt has it on his [facility] MyChart)  PATIENT SURVEYS:  Quick Dash 59.1% 07/13/22: ODI 14/50  COGNITION: Overall cognitive status: Within functional limits for tasks assessed  SENSATION: N/T in the pinky intermittently  POSTURE:  rounded shoulders and increased thoracic kyphosis Decreased lumbar lordosis   PALPATION: Hypomobile lower c-spine but no overt muscle tension in cervical or thoracic Tightness present in bil lumbar multifidi, non-tender Tightness present in bil gluteals and piriformis, non-tender    CERVICAL ROM:   Active ROM A/PROM (deg) eval AROM 07/20/22  Flexion 45   Extension 37 * (pulls into shoulder)   Right lateral flexion 20 *   Left lateral flexion 27   Right rotation 47 *   Left rotation 55    (Blank rows = not tested) * = concordant pain  UPPER EXTREMITY ROM: WFL  Active ROM Right eval Left eval  Shoulder flexion    Shoulder extension    Shoulder abduction    Shoulder adduction    Shoulder extension  Shoulder internal rotation    Shoulder external rotation    Elbow flexion    Elbow extension    Wrist flexion    Wrist extension    Wrist ulnar deviation    Wrist radial deviation    Wrist pronation    Wrist supination     (Blank rows = not tested)  UPPER EXTREMITY MMT:  MMT Right eval Left eval  Shoulder flexion 5 5  Shoulder extension 3+ 5  Shoulder abduction 4 5  Shoulder adduction    Shoulder internal rotation 5 5  Shoulder external rotation 3+ 5  Middle trapezius 3+ 4  Lower trapezius 3+ 4  Elbow flexion    Elbow extension    Wrist flexion 5 5  Wrist extension 3+ 5  Wrist ulnar deviation    Wrist radial deviation    Wrist pronation    Wrist supination    Grip strength 35, 40, 33 (with  compensatory wrist flexion) 100, 95, 93   (Blank rows = not tested)  LUMBAR ROM:   Active ROM A/PROM (deg) eval  Flexion Fingers to ground no pain  Extension 12 slight pain  Right lateral flexion Full no pain  Left lateral flexion Full no pain  Right rotation 75% no pain  Left rotation 75% no pain    LE P/ROM:  Left: limited hip IR 5 deg Right: limited hip IR 5 deg   LE MMT: Left: 5/5 Right: 5/5   LE FLEXIBILITY:  End range tightness bil hamstrings, quads, hip flexors, QL, adductors  CERVICAL SPECIAL TESTS:  Roo's test (-) 07/13/22:  SLR negative  FUNCTIONAL TESTS:  Did not assess    TODAY'S TREATMENT:                                                                                                                              DATE:  07/20/22: UBE L2, 2.5 min fwd, 2.5 min bwd Doorway pec stretch high, mid, low x30 sec each Anterior scalene stretch x30 sec Cervical neural glide x10 Seated Wrist flexion red TB 2x10 Wrist extension red TB 2x10 Radial deviation red TB 2x10 Shoulder D2 red TB 2x10 Skilled assessment and palpation for TPDN Trigger Point Dry-Needling  Treatment instructions: Expect mild to moderate muscle soreness. S/S of pneumothorax if dry needled over a lung field, and to seek immediate medical attention should they occur. Patient verbalized understanding of these instructions and education.  Patient Consent Given: Yes Education handout provided: Yes Muscles treated: R UT, R pec minor/major, R deltoid Electrical stimulation performed: No Parameters: N/A Treatment response/outcome: Twitch response, improved muscle length   07/13/22: Verbal review of HEP from initial eval  ODI intake survey 14/50 Objective assessment of lumbar and LE ROM, MMT, flexiblity Body mechanics assessment holding 10lb kbell: squat and deadlift Supine HS stretch bil hold back of thigh x 30" Supine butterfly x 30" LTR 10x5" Attempted hip flexor/quad stretch off edge of  table, but switched to  kneeling hip flexor stretch x30" bil HEP progression for LE stretching Discussion of findings Grip strength Rt UE per Pt request end of session: 42lb  06/29/22 See HEP below    PATIENT EDUCATION:  Education details: Exam findings, POC, initial HEP Person educated: Patient Education method: Explanation, Demonstration, and Handouts Education comprehension: verbalized understanding, returned demonstration, and needs further education  HOME EXERCISE PROGRAM: Access Code: [email or web address] URL: [email or web address] Date: 07/13/2022 Prepared by: Loistine Simas Beuhring  Exercises - Doorway Pec Stretch at 90 Degrees Abduction  - 1 x daily - 7 x weekly - 2 sets - 30 sec hold - Doorway Pec Stretch at 60 Elevation  - 1 x daily - 7 x weekly - 2 sets - 30 sec hold - Corner Pec Minor Stretch  - 1 x daily - 7 x weekly - 2 sets - 30 sec hold - Shoulder External Rotation and Scapular Retraction with Resistance  - 1 x daily - 7 x weekly - 2 sets - 10 reps - Shoulder W - External Rotation with Resistance  - 1 x daily - 7 x weekly - 2 sets - 10 reps - Supine Hamstring Stretch  - 1 x daily - 7 x weekly - 1 sets - 2 reps - 30 hold - Supine Butterfly Groin Stretch  - 1 x daily - 7 x weekly - 1 sets - 2 reps - 30 hold - Supine Lower Trunk Rotation  - 1 x daily - 7 x weekly - 1 sets - 10 reps - 5 hold - Half Kneeling Hip Flexor Stretch with Sidebend  - 1 x daily - 7 x weekly - 1 sets - 2 reps - 30 hold  ASSESSMENT:  CLINICAL IMPRESSION: Performed trial of TPDN to improve any muscular tightness that may cause nerve impingement.  Continued scapular stabilization. Added wrist strengthening this session. Encouraged pt to be more consistent with his HEP. May benefit from further neural work up (I.e. nerve conduction). Pt does not yet have a neuro follow-up.   OBJECTIVE IMPAIRMENTS: decreased activity tolerance, decreased mobility, decreased ROM, decreased strength, hypomobility, impaired  sensation, impaired UE functional use, improper body mechanics, and pain.   ACTIVITY LIMITATIONS: carrying, lifting, bending, sleeping, bathing, toileting, dressing, and caring for others  PARTICIPATION LIMITATIONS: meal prep, cleaning, driving, community activity, and yard work  PERSONAL FACTORS: Fitness, Past/current experiences, and Time since onset of injury/illness/exacerbation are also affecting patient's functional outcome.   REHAB POTENTIAL: Good  CLINICAL DECISION MAKING: Evolving/moderate complexity  EVALUATION COMPLEXITY: Moderate   GOALS: Goals reviewed with patient? Yes  SHORT TERM GOALS: Target date: 07/27/2022   Pt will be ind with initial HEP Baseline:  Goal status: ongoing  2.  Pt will demo improved R grip strength to >/=50# Baseline:  Goal status: INITIAL  3.  Pt will demo L = R cervical ROM Baseline:  Goal status: INITIAL   LONG TERM GOALS: Target date: 08/24/2022   Pt will be ind with management and progression of HEP Baseline:  Goal status: INITIAL  2.  Pt will report >/=50% decrease in low back pain with bending/gardening tasks Baseline:  Goal status: INITIAL  3.  Pt will have improved R hand grip strength to >/=70# for grasping tasks Baseline:  Goal status: INITIAL  4.  Pt will report no pain with all cervical motions Baseline:  Goal status: INITIAL  5.  Pt will have improved Quick DASH to </=49.1% to demo MCID Baseline:  Goal status: INITIAL  6.  Pt will have improved  modified Oswestry score by >/=10% from initial intake to demo MCID Baseline:  Goal status: INITIAL   PLAN:  PT FREQUENCY: 2x/week  PT DURATION: 8 weeks  PLANNED INTERVENTIONS: Therapeutic exercises, Therapeutic activity, Neuromuscular re-education, Balance training, Gait training, Patient/Family education, Self Care, Joint mobilization, Aquatic Therapy, Dry Needling, Electrical stimulation, Spinal mobilization, Cryotherapy, Moist heat, Taping, Traction, Manual  therapy, and Re-evaluation  PLAN FOR NEXT SESSION:  Review/modify HEP as indicated. For lumbar spine: DN lumbar multifidi and lateral hips and progress loaded deadlifts and squats, maybe try standing T with kbell.  Progress posterior shoulder strengthening. Consider trial of mechanical traction for neck stenosis.   Braxtyn Bojarski April Dell Ponto, PT 07/20/22 2:05 PM

## 2022-07-27 ENCOUNTER — Ambulatory Visit: Payer: Medicare Other | Admitting: Physical Therapy

## 2022-07-31 ENCOUNTER — Ambulatory Visit: Payer: Medicare Other | Admitting: Physical Therapy

## 2022-08-04 ENCOUNTER — Ambulatory Visit: Payer: Medicare Other

## 2022-08-04 DIAGNOSIS — R293 Abnormal posture: Secondary | ICD-10-CM

## 2022-08-04 DIAGNOSIS — M6281 Muscle weakness (generalized): Secondary | ICD-10-CM | POA: Diagnosis not present

## 2022-08-04 DIAGNOSIS — M25511 Pain in right shoulder: Secondary | ICD-10-CM

## 2022-08-04 NOTE — Therapy (Addendum)
OUTPATIENT PHYSICAL THERAPY LUMBAR/CERVICAL TREATMENT NOTE   Patient Name: Brandon Frank MRN: [id number] DOB:1955/06/23, 67 y.o., male Today's Date: 08/04/2022  END OF SESSION:  PT End of Session - 08/04/22 1027     Visit Number 4    Date for PT Re-Evaluation 08/24/22    Authorization Type Medicare    PT Start Time 0934    PT Stop Time 1016    PT Time Calculation (min) 42 min    Activity Tolerance Patient tolerated treatment well    Behavior During Therapy [facility] for tasks assessed/performed               Past Medical History:  Diagnosis Date   Anxiety 02/03/2011   Atrial fibrillation and flutter (HCC) 07/13/2017   Bilateral cataracts    Bulging lumbar disc    5   Chronic low back pain    Closed fracture of coccyx (HCC)    Degeneration of lumbar intervertebral disc    Degenerative lumbar spinal stenosis    Diarrhea    due to side effects from medications   Family history of premature CAD 03/20/2015   GERD (gastroesophageal reflux disease)    Hereditary and idiopathic peripheral neuropathy 12/06/2006   Qualifier: Diagnosis of  By: Cato Mulligan MD, [name]     History of bilateral inguinal hernias    at birth   History of colon polyps    Hyperlipidemia    Hypertension associated with diabetes (HCC) 04/01/2016   Infection of urinary tract 06/22/2015   Inguinal hernia    bilateral at birth   Insomnia 12/06/2006   Qualifier: Diagnosis of  By: Cato Mulligan MD, [name]     Lipoma of scalp 07/2003   lipoma of 11 x 35 mm   Low testosterone 10/07/2015   Lumbar radiculopathy    Mobitz type 1 second degree atrioventricular block    a. Event monitor 2017 - 1 beat.   Nonalcoholic fatty liver disease 11/24/2017   pt unaware   Numbness in feet    and cold   OA (osteoarthritis)    Obstructive sleep apnea 12/06/2013   NPSG 11/14/13- Severe OSA, AHI 86/ hr with loud snore, desat ot 80%, titrated to CPAP 12, weight 215 lbs    Overweight 10/23/2008   Qualifier: Diagnosis of  By: Myrtis Ser, MD, Graciella Belton     Paroxysmal atrial flutter [facility])    Plantar fasciitis of right foot 10/21/2012   resolved   Premature atrial contractions    Prostate cancer (HCC)    PVC's (premature ventricular contractions)    Rotator cuff tear 11/2010   Right   Sigmoid diverticulosis 10/19/2017   Noted on colonoscopy   Sinus tachycardia 10/27/2013   Persistent mild sinus tachycardia, October, 2015   //   48 hour Holter, October, 2015, rare PACs and PVCs. Heart rate range 70-128, mean heart rate 95 during the daytime, mean heart rate 85 during the resting hours.   //   Patient was diagnosed with severe sleep apnea. C Pap was started and his resting sinus tachycardia improved greatly.    Spinal stenosis    TRANSAMINASES, SERUM, ELEVATED 11/30/2005   Qualifier: Diagnosis of  By: Cato Mulligan MD, [name]     Type 2 diabetes mellitus, uncontrolled 04/14/2012   URI (upper respiratory infection) 06/22/2015   Past Surgical History:  Procedure Laterality Date   ABLATION     tailbone twice   BIOPSY  10/19/2017   Procedure: BIOPSY;  Surgeon: Charna Elizabeth, MD;  Location: [facility]  END[facility];  Service: Endoscopy;;   COLONOSCOPY WITH PROPOFOL N/A 10/19/2017   Procedure: COLONOSCOPY WITH PROPOFOL;  Surgeon: Charna Elizabeth, MD;  Location: [facility] ENDOSCOPY;  Service: Endoscopy;  Laterality: N/A;   epidural steroid injection     Lower back   HERNIA REPAIR  1963   bilateral inguinal   NASAL SINUS SURGERY     POLYPECTOMY  10/19/2017   Procedure: POLYPECTOMY;  Surgeon: Charna Elizabeth, MD;  Location: [facility] ENDOSCOPY;  Service: Endoscopy;;   PROSTATE BIOPSY N/A    x2   RADIOACTIVE SEED IMPLANT N/A 01/04/2018   Procedure: RADIOACTIVE SEED IMPLANT BRACHYTHERAPY WITH FLEXIBLE CYSTOSCOPOY;  Surgeon: Crista Elliot, MD;  Location: [facility] ORS;  Service: Urology;  Laterality: N/A;   rotator cuff surgery Right 2012   Dr. Ranell Patrick   Patient Active Problem List   Diagnosis Date Noted   GAD (generalized anxiety disorder) 12/15/2017   MDD (major  depressive disorder) 12/15/2017   Malignant neoplasm of prostate (HCC) 09/28/2017   Atrial fibrillation and flutter (HCC) 07/13/2017   Hypertension associated with diabetes (HCC) 04/01/2016   Low testosterone 10/07/2015   Infection of urinary tract 06/22/2015   URI (upper respiratory infection) 06/22/2015   Family history of premature CAD 03/20/2015   Obstructive sleep apnea 12/06/2013   Sinus tachycardia 10/27/2013   Ejection fraction    Plantar fasciitis of right foot 10/21/2012   Type 2 diabetes mellitus, uncontrolled 04/14/2012   Anxiety 02/03/2011   Overweight 10/23/2008   Hereditary and idiopathic peripheral neuropathy 12/06/2006   Insomnia 12/06/2006   TRANSAMINASES, SERUM, ELEVATED 11/30/2005   Hyperlipidemia 11/29/2005    PCP: Gwenlyn Found, MD  REFERRING PROVIDER: Gladis Riffle, MD  REFERRING DIAG: [phone] (ICD-10-CM) - Radiculopathy, cervical region R29.898 (ICD-10-CM) - Other symptoms and signs involving the musculoskeletal system  THERAPY DIAG:  Muscle weakness (generalized)  Abnormal posture  Acute pain of right shoulder  Rationale for Evaluation and Treatment: Rehabilitation  ONSET DATE: March 2024 for his neck; chronic back pain (~10 years)  SUBJECTIVE:                                                                                                                                                                                                         SUBJECTIVE STATEMENT: I am having more pain in my legs from peripheral neuropathy.  My Rt hand feels stronger today.    Eval: Pt states he came here 6-7 years ago for L4-L5 bulging disc. Pt reports he got a bunch of exercises but slowly stopped doing them. Pt ended up  getting surgery but got an infection in 2020. Pt notes history of broken coccyx 15-20 years ago with ablation. Pt just had ablations performed on low back a few months ago but it has not really gotten fully better since the  infection. Pt would like to work on improving his pain with gardening/bending tasks. In regards to his neck, he states in March he got a massage and noticed increased pain when the massage therapist worked on his scapular muscles (he points primarily to his subscapularis). Since then, he has noticed increased hand weakness and was diagnosed with a pinched nerve. Pt had MRI performed which showed severe foraminal narrowing particularly in lower cervical spine. Pt got a shot/epidural which helped a little bit in getting more strength in his hand and he has plans to get another shot. He reports he did a few visits of PT for his neck and was given a few stretches which have helped. He does not note a lot of neck pain but primarily R shoulder pain.  Hand dominance: Right  PERTINENT HISTORY:  R rotator cuff surgery ~12 years  PAIN:  Are you having pain? Yes: NPRS scale: 0 currently/10 Pain location: Top of right shoulder Pain description: Aching Aggravating factors: Rotating neck to the right he can feel it in his right shoulder Relieving factors: Rest  Are you having pain? Yes: NPRS scale: 4 currently, at worst 8/10 Pain location: Center low back Pain description: aching Aggravating factors: Gardening, bending over Relieving factors: Pain medication, ablations   PRECAUTIONS: None  WEIGHT BEARING RESTRICTIONS: No  FALLS:  Has patient fallen in last 6 months? No and Yes. Number of falls 2; 3 weeks ago most recent fall due to syncope when getting up  LIVING ENVIRONMENT: Lives with: lives with their spouse Lives in: House/apartment Stairs: Yes: Internal: 10 steps; none  OCCUPATION: Retired -- likes to garden and a lot of outside work  PLOF: Independent  PATIENT GOALS: Pt would like to work on right hand strength, review exercises for the low back  NEXT MD VISIT: n/a  OBJECTIVE:   VITALS 137/93 BP, 88 BPM sitting at rest on initial presentation  DIAGNOSTIC FINDINGS:  MRI cervical  spine 05/13/22 but unable to see on Epic (pt has it on his [facility] MyChart)  PATIENT SURVEYS:  Quick Dash 59.1% 07/13/22: ODI 14/50  COGNITION: Overall cognitive status: Within functional limits for tasks assessed  SENSATION: N/T in the pinky intermittently  POSTURE:  rounded shoulders and increased thoracic kyphosis Decreased lumbar lordosis   PALPATION: Hypomobile lower c-spine but no overt muscle tension in cervical or thoracic Tightness present in bil lumbar multifidi, non-tender Tightness present in bil gluteals and piriformis, non-tender    CERVICAL ROM:   Active ROM A/PROM (deg) eval AROM 07/20/22  Flexion 45   Extension 37 * (pulls into shoulder)   Right lateral flexion 20 *   Left lateral flexion 27   Right rotation 47 *   Left rotation 55    (Blank rows = not tested) * = concordant pain  UPPER EXTREMITY ROM: WFL  Active ROM Right eval Left eval  Shoulder flexion    Shoulder extension    Shoulder abduction    Shoulder adduction    Shoulder extension    Shoulder internal rotation    Shoulder external rotation    Elbow flexion    Elbow extension    Wrist flexion    Wrist extension    Wrist ulnar deviation    Wrist radial  deviation    Wrist pronation    Wrist supination     (Blank rows = not tested)  UPPER EXTREMITY MMT:  MMT Right eval Right 08/04/22 Left eval  Shoulder flexion 5  5  Shoulder extension 3+  5  Shoulder abduction 4  5  Shoulder adduction     Shoulder internal rotation 5  5  Shoulder external rotation 3+  5  Middle trapezius 3+  4  Lower trapezius 3+  4  Elbow flexion     Elbow extension     Wrist flexion 5  5  Wrist extension 3+  5  Wrist ulnar deviation     Wrist radial deviation     Wrist pronation     Wrist supination     Grip strength 35, 40, 33 (with compensatory wrist flexion) 35 100, 95, 93   (Blank rows = not tested)  LUMBAR ROM:   Active ROM A/PROM (deg) eval  Flexion Fingers to ground no pain  Extension  12 slight pain  Right lateral flexion Full no pain  Left lateral flexion Full no pain  Right rotation 75% no pain  Left rotation 75% no pain    LE P/ROM:  Left: limited hip IR 5 deg Right: limited hip IR 5 deg   LE MMT: Left: 5/5 Right: 5/5   LE FLEXIBILITY:  End range tightness bil hamstrings, quads, hip flexors, QL, adductors  CERVICAL SPECIAL TESTS:  Roo's test (-) 07/13/22:  SLR negative  FUNCTIONAL TESTS:  Did not assess    TODAY'S TREATMENT:       DATE:  08/04/22: NuStep: Level 5x 6 minutes- PT present to discuss progress  Seated hamstring stretch 3x20 seconds  Standing hip abduction and extension on balance pad 2x10 bil each Seated figure 4 3x20 seconds  Doorway pec stretch high, mid, low x30 sec each Knee to chest 2x20 seconds  Supine D2 and horizontal abduction with TA activation 2x10 bil each                                                                                                                        DATE:  07/20/22: UBE L2, 2.5 min fwd, 2.5 min bwd Doorway pec stretch high, mid, low x30 sec each Anterior scalene stretch x30 sec Cervical neural glide x10 Seated Wrist flexion red TB 2x10 Wrist extension red TB 2x10 Radial deviation red TB 2x10 Shoulder D2 red TB 2x10 Skilled assessment and palpation for TPDN Trigger Point Dry-Needling  Treatment instructions: Expect mild to moderate muscle soreness. S/S of pneumothorax if dry needled over a lung field, and to seek immediate medical attention should they occur. Patient verbalized understanding of these instructions and education.  Patient Consent Given: Yes Education handout provided: Yes Muscles treated: R UT, R pec minor/major, R deltoid Electrical stimulation performed: No Parameters: N/A Treatment response/outcome: Twitch response, improved muscle length   07/13/22: Verbal review of HEP from initial eval  ODI intake survey 14/50 Objective assessment of lumbar  and LE ROM, MMT,  flexiblity Body mechanics assessment holding 10lb kbell: squat and deadlift Supine HS stretch bil hold back of thigh x 30" Supine butterfly x 30" LTR 10x5" Attempted hip flexor/quad stretch off edge of table, but switched to kneeling hip flexor stretch x30" bil HEP progression for LE stretching Discussion of findings Grip strength Rt UE per Pt request end of session: 42lb   PATIENT EDUCATION:  Education details: Exam findings, POC, initial HEP Person educated: Patient Education method: Explanation, Demonstration, and Handouts Education comprehension: verbalized understanding, returned demonstration, and needs further education  HOME EXERCISE PROGRAM: Access Code: [email or web address] URL: [email or web address] Date: 07/13/2022 Prepared by: Loistine Simas Beuhring  Exercises - Doorway Pec Stretch at 90 Degrees Abduction  - 1 x daily - 7 x weekly - 2 sets - 30 sec hold - Doorway Pec Stretch at 60 Elevation  - 1 x daily - 7 x weekly - 2 sets - 30 sec hold - Corner Pec Minor Stretch  - 1 x daily - 7 x weekly - 2 sets - 30 sec hold - Shoulder External Rotation and Scapular Retraction with Resistance  - 1 x daily - 7 x weekly - 2 sets - 10 reps - Shoulder W - External Rotation with Resistance  - 1 x daily - 7 x weekly - 2 sets - 10 reps - Supine Hamstring Stretch  - 1 x daily - 7 x weekly - 1 sets - 2 reps - 30 hold - Supine Butterfly Groin Stretch  - 1 x daily - 7 x weekly - 1 sets - 2 reps - 30 hold - Supine Lower Trunk Rotation  - 1 x daily - 7 x weekly - 1 sets - 10 reps - 5 hold - Half Kneeling Hip Flexor Stretch with Sidebend  - 1 x daily - 7 x weekly - 1 sets - 2 reps - 30 hold  ASSESSMENT:  CLINICAL IMPRESSION: Pt arrived with report of bil LE radiculopathy/pain that has been new in the past few days.  Pt denies any change in symptoms after DN last session.  PT focused on balance and core strength to address proprioception and lumbar stability.  No increase in LE pain with exercise  today. Pt required some cueing for standing balance tasks to reduce compensation.  Patient will benefit from skilled PT to address the below impairments and improve overall function.   OBJECTIVE IMPAIRMENTS: decreased activity tolerance, decreased mobility, decreased ROM, decreased strength, hypomobility, impaired sensation, impaired UE functional use, improper body mechanics, and pain.   ACTIVITY LIMITATIONS: carrying, lifting, bending, sleeping, bathing, toileting, dressing, and caring for others  PARTICIPATION LIMITATIONS: meal prep, cleaning, driving, community activity, and yard work  PERSONAL FACTORS: Fitness, Past/current experiences, and Time since onset of injury/illness/exacerbation are also affecting patient's functional outcome.   REHAB POTENTIAL: Good  CLINICAL DECISION MAKING: Evolving/moderate complexity  EVALUATION COMPLEXITY: Moderate   GOALS: Goals reviewed with patient? Yes  SHORT TERM GOALS: Target date: 07/27/2022   Pt will be ind with initial HEP Baseline:  Goal status: MET  2.  Pt will demo improved R grip strength to >/=50# Baseline: 35# (08/04/22) Goal status: In progress   3.  Pt will demo L = R cervical ROM Baseline:  Goal status: INITIAL   LONG TERM GOALS: Target date: 08/24/2022   Pt will be ind with management and progression of HEP Baseline:  Goal status: INITIAL  2.  Pt will report >/=50% decrease in low back pain with bending/gardening tasks Baseline:  Goal status: INITIAL  3.  Pt will have improved R hand grip strength to >/=70# for grasping tasks Baseline:  Goal status: INITIAL  4.  Pt will report no pain with all cervical motions Baseline:  Goal status: INITIAL  5.  Pt will have improved Quick DASH to </=49.1% to demo MCID Baseline:  Goal status: INITIAL  6.  Pt will have improved modified Oswestry score by >/=10% from initial intake to demo MCID Baseline:  Goal status: INITIAL   PLAN:  PT FREQUENCY: 2x/week  PT  DURATION: 8 weeks  PLANNED INTERVENTIONS: Therapeutic exercises, Therapeutic activity, Neuromuscular re-education, Balance training, Gait training, Patient/Family education, Self Care, Joint mobilization, Aquatic Therapy, Dry Needling, Electrical stimulation, Spinal mobilization, Cryotherapy, Moist heat, Taping, Traction, Manual therapy, and Re-evaluation  PLAN FOR NEXT SESSION:  measure cervical A/ROM, address neck, lumbar and Rt UE weakness,  Consider trial of mechanical traction for neck stenosis.   Lorrene Reid, PT 08/04/22 10:30 AM    PHYSICAL THERAPY DISCHARGE SUMMARY  Visits from Start of Care: 4  Current functional level related to goals / functional outcomes: See above for current status-pt didn't return.   Remaining deficits: See above    Education / Equipment: HEP   Patient agrees to discharge. Patient goals were not met. Patient is being discharged due to not returning since the last visit.  Lorrene Reid, PT 02/18/23 9:21 AM  [facility] [address], [address] [place], [place] [address] Phone # [phone] Fax [phone]

## 2022-10-09 ENCOUNTER — Telehealth: Payer: Self-pay

## 2022-10-09 NOTE — Telephone Encounter (Signed)
Pre-operative Risk Assessment    Patient Name: Brandon Frank  DOB: 11-26-1955 MRN: [id number]   LAST OFFICE VISIT: 03/18/22 WITH Robin Searing, NP NEXT OFFICE VISIT: NONE SCHEDULED     Request for Surgical Clearance    Procedure:   C5-7 AEDF  Date of Surgery:  Clearance TBD  (ASAP)                               Surgeon:  DR. MAX COHEN Surgeon's Group or Practice Name:  [facility] Phone number:  [phone] Fax number:  [phone]   Type of Clearance Requested:   - Pharmacy:  Hold Apixaban (Eliquis) needs instructions when to hold   Type of Anesthesia:  General    Additional requests/questions:    SignedMichaelle Copas   10/09/2022, 12:40 PM

## 2022-10-12 NOTE — Telephone Encounter (Signed)
Covering preop. Last OV 02/2022 reviewed. Will route to pharm then plan to recommend VV.

## 2022-10-12 NOTE — Telephone Encounter (Signed)
Name: Brandon Frank  DOB: [birth year]  MRN: [id number]  Primary Cardiologist: Armanda Magic, MD   Preoperative team, please contact this patient and set up a phone call appointment for further preoperative risk assessment. Please obtain consent and complete medication review. Thank you for your help.  I confirm that guidance regarding antiplatelet and oral anticoagulation therapy has been completed and, if necessary, noted below.  Per Pharm D, patient may hold Eliquis for 3 days prior to procedure.     Carlos Levering, NP 10/12/2022, 4:10 PM [facility]

## 2022-10-12 NOTE — Telephone Encounter (Signed)
Patient with diagnosis of afib on Eliquis for anticoagulation.    Procedure:  C5-7 AEDF  Date of procedure: ASAP   CHA2DS2-VASc Score = 4   This indicates a 4.8% annual risk of stroke. The patient's score is based upon: CHF History: 0 HTN History: 1 Diabetes History: 1 Stroke History: 0 Vascular Disease History: 1 Age Score: 1 Gender Score: 0      CrCl 87 ml/min Platelet count 177  Per office protocol, patient can hold Eliquis for 3 days prior to procedure.    **This guidance is not considered finalized until pre-operative APP has relayed final recommendations.**

## 2022-10-13 NOTE — Telephone Encounter (Signed)
I left a message for the patient to call our office back to schedule a tele visit for pre-op clearance.

## 2022-10-15 ENCOUNTER — Telehealth: Payer: Self-pay | Admitting: *Deleted

## 2022-10-15 NOTE — Telephone Encounter (Signed)
  Patient Consent for Virtual Visit       Brandon Frank has provided verbal consent on 10/15/2022 for a virtual visit (video or telephone).   CONSENT FOR VIRTUAL VISIT FOR:  Brandon Frank  By participating in this virtual visit I agree to the following:  I hereby voluntarily request, consent and authorize [facility] and its employed or contracted physicians, physician assistants, nurse practitioners or other licensed health care professionals (the Practitioner), to provide me with telemedicine health care services (the "Services") as deemed necessary by the treating Practitioner. I acknowledge and consent to receive the Services by the Practitioner via telemedicine. I understand that the telemedicine visit will involve communicating with the Practitioner through live audiovisual communication technology and the disclosure of certain medical information by electronic transmission. I acknowledge that I have been given the opportunity to request an in-person assessment or other available alternative prior to the telemedicine visit and am voluntarily participating in the telemedicine visit.  I understand that I have the right to withhold or withdraw my consent to the use of telemedicine in the course of my care at any time, without affecting my right to future care or treatment, and that the Practitioner or I may terminate the telemedicine visit at any time. I understand that I have the right to inspect all information obtained and/or recorded in the course of the telemedicine visit and may receive copies of available information for a reasonable fee.  I understand that some of the potential risks of receiving the Services via telemedicine include:  Delay or interruption in medical evaluation due to technological equipment failure or disruption; Information transmitted may not be sufficient (e.g. poor resolution of images) to allow for appropriate medical decision making by the Practitioner;  and/or  In rare instances, security protocols could fail, causing a breach of personal health information.  Furthermore, I acknowledge that it is my responsibility to provide information about my medical history, conditions and care that is complete and accurate to the best of my ability. I acknowledge that Practitioner's advice, recommendations, and/or decision may be based on factors not within their control, such as incomplete or inaccurate data provided by me or distortions of diagnostic images or specimens that may result from electronic transmissions. I understand that the practice of medicine is not an exact science and that Practitioner makes no warranties or guarantees regarding treatment outcomes. I acknowledge that a copy of this consent can be made available to me via my patient portal [facility] MyChart), or I can request a printed copy by calling the office of [facility].    I understand that my insurance will be billed for this visit.   I have read or had this consent read to me. I understand the contents of this consent, which adequately explains the benefits and risks of the Services being provided via telemedicine.  I have been provided ample opportunity to ask questions regarding this consent and the Services and have had my questions answered to my satisfaction. I give my informed consent for the services to be provided through the use of telemedicine in my medical care

## 2022-10-15 NOTE — Telephone Encounter (Signed)
Patient scheduled.

## 2022-12-07 ENCOUNTER — Ambulatory Visit: Payer: Medicare Other | Attending: Cardiology | Admitting: Cardiology

## 2022-12-07 ENCOUNTER — Encounter: Payer: Self-pay | Admitting: Cardiology

## 2022-12-07 DIAGNOSIS — Z01810 Encounter for preprocedural cardiovascular examination: Secondary | ICD-10-CM

## 2022-12-07 NOTE — Progress Notes (Signed)
Virtual Visit via Telephone Note   Because of Brandon Frank's co-morbid illnesses, he is at least at moderate risk for complications without adequate follow up.  This format is felt to be most appropriate for this patient at this time.  The patient did not have access to video technology/had technical difficulties with video requiring transitioning to audio format only (telephone).  All issues noted in this document were discussed and addressed.  No physical exam could be performed with this format.  Please refer to the patient's chart for his consent to telehealth for [facility].  Evaluation Performed:  Preoperative cardiovascular risk assessment _____________   Date:  12/07/2022   Patient ID:  Brandon Frank, DOB 10-18-1955, MRN [id number] Patient Location:  Home Provider location:   Office  Primary Care Provider:  Gwenlyn Found, MD Primary Cardiologist:  Armanda Magic, MD  Chief Complaint / Patient Profile   67 y.o. y/o male with a h/o HTN, CAD, sinus tachycardia, PACs, PVCs, OSA, prostate CA, HLD, paroxysmal afib who is pending C5-7 ACDF and presents today for telephonic preoperative cardiovascular risk assessment.  History of Present Illness    Brandon Frank is a 67 y.o. male who presents via audio/video conferencing for a telehealth visit today. Pt was last seen in cardiology clinic on 03/18/22 by Robin Searing, NP. At that time Brandon Frank was doing well. The patient is now pending procedure as outlined above. Since his last visit, he has remained stable from a cardiac perspective. Today he denies chest pain, palpitations, dyspnea, pnd, orthopnea, dizziness, syncope, edema, weight gain, or early satiety.   Past Medical History    Past Medical History:  Diagnosis Date   Anxiety 02/03/2011   Atrial fibrillation and flutter (HCC) 07/13/2017   Bilateral cataracts    Bulging lumbar disc    5   Chronic low back pain    Closed fracture of coccyx (HCC)     Degeneration of lumbar intervertebral disc    Degenerative lumbar spinal stenosis    Diarrhea    due to side effects from medications   Family history of premature CAD 03/20/2015   GERD (gastroesophageal reflux disease)    Hereditary and idiopathic peripheral neuropathy 12/06/2006   Qualifier: Diagnosis of  By: Cato Mulligan MD, [name]     History of bilateral inguinal hernias    at birth   History of colon polyps    Hyperlipidemia    Hypertension associated with diabetes (HCC) 04/01/2016   Infection of urinary tract 06/22/2015   Inguinal hernia    bilateral at birth   Insomnia 12/06/2006   Qualifier: Diagnosis of  By: Cato Mulligan MD, [name]     Lipoma of scalp 07/2003   lipoma of 11 x 35 mm   Low testosterone 10/07/2015   Lumbar radiculopathy    Mobitz type 1 second degree atrioventricular block    a. Event monitor 2017 - 1 beat.   Nonalcoholic fatty liver disease 11/24/2017   pt unaware   Numbness in feet    and cold   OA (osteoarthritis)    Obstructive sleep apnea 12/06/2013   NPSG 11/14/13- Severe OSA, AHI 86/ hr with loud snore, desat ot 80%, titrated to CPAP 12, weight 215 lbs    Overweight 10/23/2008   Qualifier: Diagnosis of  By: Myrtis Ser, MD, Graciella Belton     Paroxysmal atrial flutter [facility])    Plantar fasciitis of right foot 10/21/2012   resolved   Premature atrial contractions  Prostate cancer (HCC)    PVC's (premature ventricular contractions)    Rotator cuff tear 11/2010   Right   Sigmoid diverticulosis 10/19/2017   Noted on colonoscopy   Sinus tachycardia 10/27/2013   Persistent mild sinus tachycardia, October, 2015   //   48 hour Holter, October, 2015, rare PACs and PVCs. Heart rate range 70-128, mean heart rate 95 during the daytime, mean heart rate 85 during the resting hours.   //   Patient was diagnosed with severe sleep apnea. C Pap was started and his resting sinus tachycardia improved greatly.    Spinal stenosis    TRANSAMINASES, SERUM, ELEVATED 11/30/2005    Qualifier: Diagnosis of  By: Cato Mulligan MD, [name]     Type 2 diabetes mellitus, uncontrolled 04/14/2012   URI (upper respiratory infection) 06/22/2015   Past Surgical History:  Procedure Laterality Date   ABLATION     tailbone twice   BIOPSY  10/19/2017   Procedure: BIOPSY;  Surgeon: Charna Elizabeth, MD;  Location: [facility];  Service: Endoscopy;;   COLONOSCOPY WITH PROPOFOL N/A 10/19/2017   Procedure: COLONOSCOPY WITH PROPOFOL;  Surgeon: Charna Elizabeth, MD;  Location: [facility];  Service: Endoscopy;  Laterality: N/A;   epidural steroid injection     Lower back   HERNIA REPAIR  1963   bilateral inguinal   NASAL SINUS SURGERY     POLYPECTOMY  10/19/2017   Procedure: POLYPECTOMY;  Surgeon: Charna Elizabeth, MD;  Location: [facility];  Service: Endoscopy;;   PROSTATE BIOPSY N/A    x2   RADIOACTIVE SEED IMPLANT N/A 01/04/2018   Procedure: RADIOACTIVE SEED IMPLANT BRACHYTHERAPY WITH FLEXIBLE CYSTOSCOPOY;  Surgeon: Crista Elliot, MD;  Location: [facility];  Service: Urology;  Laterality: N/A;   rotator cuff surgery Right 2012   Dr. Ranell Patrick   Allergies Allergies  Allergen Reactions   Flexeril [Cyclobenzaprine]    Home Medications    Prior to Admission medications   Medication Sig Start Date End Date Taking? Authorizing Provider  acetaminophen (TYLENOL) 500 MG tablet Take 1,000 mg by mouth 2 (two) times daily as needed for moderate pain or headache.    [provider]  apixaban (ELIQUIS) 5 MG TABS tablet Take 1 tablet (5 mg total) by mouth 2 (two) times daily. 10/01/21   Gaston Islam., NP  apixaban (ELIQUIS) 5 MG TABS tablet Take 1 tablet (5 mg total) by mouth 2 (two) times daily. 04/16/22   Quintella Reichert, MD  BAYER MICROLET LANCETS lancets USE   TO CHECK GLUCOSE TWICE DAILY 10/18/17   Burchette, Elberta Fortis, MD  buprenorphine (SUBUTEX) 2 MG SUBL SL tablet 2 mg every 12 (twelve) hours. 07/21/21   [provider]  buPROPion (WELLBUTRIN XL) 150 MG 24 hr tablet Take 150 mg by mouth  daily.    [provider]  calcium carbonate (TUMS - DOSED IN MG ELEMENTAL CALCIUM) 500 MG chewable tablet Chew 2 tablets by mouth daily as needed for indigestion or heartburn.     [provider]  clonazePAM (KLONOPIN) 0.5 MG tablet Take 0.5 mg by mouth at bedtime.    [provider]  CONTOUR NEXT TEST test strip USE 1 STRIP TO CHECK GLUCOSE ONCE DAILY 10/06/17   Burchette, Elberta Fortis, MD  Dulaglutide 1.5 MG/0.5ML SOPN Inject 3 mg into the skin every Friday.    [provider]  DULoxetine (CYMBALTA) 60 MG capsule TAKE 2 CAPSULES BY MOUTH EVERY OTHER DAY ALTERNATING WITH 3 CAPSULES EVERY OTHER DAY 02/23/18  Melony Overly T, PA-C  eszopiclone (LUNESTA) 2 MG TABS tablet Take 2 mg by mouth at bedtime. Take immediately before bedtime    [provider]  ezetimibe (ZETIA) 10 MG tablet Take 1 tablet (10 mg total) by mouth daily. 04/30/22 05/01/23  Gaston Islam., NP  glipiZIDE (GLUCOTROL) 10 MG tablet Take 10 mg by mouth daily before breakfast.    [provider]  glucose blood test strip One touch verio.  Test once daily. Dx E11.9 07/28/16   Burchette, Elberta Fortis, MD  insulin glargine (LANTUS SOLOSTAR) 100 UNIT/ML Solostar Pen Inject 20 Units into the skin daily. 03/10/22   [provider]  Lancets [facility] ULTRASOFT) lancets One touch verio lancets.  Test once daily.  Dx e11.9 07/28/16   Burchette, Elberta Fortis, MD  LORazepam (ATIVAN) 1 MG tablet Take 1 tablet (1 mg total) by mouth 4 (four) times daily as needed for anxiety. 12/29/17   Melony Overly T, PA-C  metFORMIN (GLUCOPHAGE) 500 MG tablet TAKE 2 TABLETS BY MOUTH TWICE DAILY WITH A MEAL. PLEASE SCHEDULE YOUR FOLLOW UP APPOINTMENT. [phone] 02/22/18   Burchette, Elberta Fortis, MD  methocarbamol (ROBAXIN) 750 MG tablet Take 750 mg by mouth every 8 (eight) hours as needed for muscle spasms.    [provider]  metoprolol succinate (TOPROL-XL) 50 MG 24 hr tablet Take 1 tablet (50 mg total) by mouth  daily. 04/30/22 05/01/23  Gaston Islam., NP  metoprolol tartrate (LOPRESSOR) 25 MG tablet Take 25 mg by mouth as directed. As needed for palpitations    [provider]  Polyethyl Glycol-Propyl Glycol (SYSTANE OP) Place 1 drop into both eyes daily as needed (dryness).    [provider]  rosuvastatin (CRESTOR) 40 MG tablet Take 1 tablet (40 mg total) by mouth daily. 04/30/22 05/01/23  Gaston Islam., NP  tadalafil (CIALIS) 5 MG tablet Take 5 mg by mouth daily.    [provider]  tamsulosin (FLOMAX) 0.4 MG CAPS capsule Take 0.4 mg by mouth daily.    [provider]  zaleplon (SONATA) 10 MG capsule Take 10 mg by mouth at bedtime as needed for sleep.    [provider]    Physical Exam    Vital Signs:  Brandon Frank does not have vital signs available for review today.  Given telephonic nature of communication, physical exam is limited. AAOx3. NAD. Normal affect.  Speech and respirations are unlabored.  Accessory Clinical Findings  None Assessment & Plan    1.  Preoperative Cardiovascular Risk Assessment: C5-7 ACDF with Dr. Noel Gerold  Mr. Vore's perioperative risk of a major cardiac event is 0.9% according to the Revised Cardiac Risk Index (RCRI). Therefore, he is at low risk for perioperative complications. His functional capacity is good at 7.99 METs according to the Duke Activity Status Index (DASI).Recommendations:According to ACC/AHA guidelines, no further cardiovascular testing needed.  The patient may proceed to surgery at acceptable risk.   Antiplatelet and/or Anticoagulation Recommendations:Per PharmD patient may hold Eliquis for 3 days prior to procedure. Please resume Eliquis as soon as possible, when safe to do so from a bleeding perspective.   The patient was advised that if he develops new symptoms prior to surgery to contact our office to arrange for a follow-up visit, and he verbalized understanding.  A copy of this note will  be routed to requesting surgeon.  Time:   Today, I have spent 17 minutes with the patient with telehealth technology discussing medical  history, symptoms, and management plan.    Rip Harbour, NP  12/07/2022, 10:26 AM

## 2023-01-20 ENCOUNTER — Other Ambulatory Visit: Payer: Self-pay | Admitting: Nurse Practitioner

## 2023-01-20 DIAGNOSIS — I4891 Unspecified atrial fibrillation: Secondary | ICD-10-CM

## 2023-01-21 NOTE — Telephone Encounter (Signed)
 Prescription refill request for Eliquis received. Indication: a fib Last office visit: 12/07/22 Scr: 0.98 09/25/22 care everywhere Age: 68 Weight: 96kg

## 2023-02-25 ENCOUNTER — Other Ambulatory Visit: Payer: Self-pay | Admitting: Nurse Practitioner

## 2023-03-17 ENCOUNTER — Encounter: Payer: Self-pay | Admitting: Pharmacist

## 2023-03-17 NOTE — Telephone Encounter (Signed)
 This encounter was created in error - please disregard.

## 2023-03-20 ENCOUNTER — Other Ambulatory Visit: Payer: Self-pay | Admitting: Cardiology

## 2023-05-13 ENCOUNTER — Telehealth: Payer: Self-pay | Admitting: *Deleted

## 2023-05-13 NOTE — Telephone Encounter (Signed)
   Pre-operative Risk Assessment    Patient Name: Brandon Frank  DOB: 1955-03-18 MRN: [id number]   Date of last office visit: 03/18/2022 Date of next office visit: N/A   Request for Surgical Clearance    Procedure:   LUMBAR EPIDURAL STEROID INJECTION  Date of Surgery:  Clearance 06/03/23                                Surgeon:   Surgeon's Group or Practice Name:  [facility] Phone number:  [phone] Fax number:  [phone]   Type of Clearance Requested:   - Pharmacy:  Hold Apixaban  (Eliquis ) X'S 3 DAYS PRIOR   Type of Anesthesia:    Additional requests/questions:    [name]   05/13/2023, 10:43 AM

## 2023-05-14 NOTE — Telephone Encounter (Signed)
     Primary Cardiologist: Gaylyn Keas, MD  Clinical pharmacist have reviewed patient's chart  as part of pre-operative protocol coverage.  The following recommendations have been provided for , Brandon Frank   Procedure:  LUMBAR EPIDURAL STEROID INJECTION  Date of procedure: 06/03/23     CHA2DS2-VASc Score = 4   This indicates a 4.8% annual risk of stroke. The patient's score is based upon: CHF History: 0 HTN History: 1 Diabetes History: 1 Stroke History: 0 Vascular Disease History: 1 Age Score: 1 Gender Score: 0       CrCl 95 ml/min Platelet count 176   Patient has not had an Afib/aflutter ablation within the last 3 months or DCCV within the last 30 days   Per office protocol, patient can hold Eliquis  for 3 days prior to procedure.  I will route this recommendation to the requesting party via Epic fax function and remove from pre-op  pool.  Please call with questions.  Brandon Frank. Brandon Domangue NP-C     05/14/2023, 1:09 PM [facility] [address] Office [phone] Fax [phone]

## 2023-05-14 NOTE — Telephone Encounter (Signed)
 Patient with diagnosis of afib on Eliquis  for anticoagulation.    Procedure:  LUMBAR EPIDURAL STEROID INJECTION  Date of procedure: 06/03/23   CHA2DS2-VASc Score = 4   This indicates a 4.8% annual risk of stroke. The patient's score is based upon: CHF History: 0 HTN History: 1 Diabetes History: 1 Stroke History: 0 Vascular Disease History: 1 Age Score: 1 Gender Score: 0      CrCl 95 ml/min Platelet count 176  Patient has not had an Afib/aflutter ablation within the last 3 months or DCCV within the last 30 days  Per office protocol, patient can hold Eliquis  for 3 days prior to procedure.    **This guidance is not considered finalized until pre-operative APP has relayed final recommendations.**

## 2023-05-27 ENCOUNTER — Telehealth: Payer: Self-pay | Admitting: *Deleted

## 2023-05-27 NOTE — Telephone Encounter (Signed)
 Please advise holding Eliquis  prior to vitrectomy with possible endolaser on 5/29.   Thank you!  DW

## 2023-05-27 NOTE — Telephone Encounter (Signed)
 Tried to call the pt though call would not go through. Left message for DPR pt's wife [employer] Employee) to call back and schedule in office preop appt.

## 2023-05-27 NOTE — Telephone Encounter (Signed)
   Pre-operative Risk Assessment    Patient Name: Brandon Frank  DOB: 06-Mar-1955 MRN: [id number]   Date of last office visit: 02/24/20024 Date of next office visit: N/A   Request for Surgical Clearance    Procedure:  VITRECTOMY WITH POSSIBLE ENDOLASER  Date of Surgery:  Clearance 06/17/23                                Surgeon:   Surgeon's Group or Practice Name:  [facility] Phone number:  [phone] Fax number:  [phone]   Type of Clearance Requested:   - Medical  - Pharmacy:  Hold Apixaban  (Eliquis ) X'S 7 DAYS PRIOR   Type of Anesthesia:  Not Indicated   Additional requests/questions:    [name]   05/27/2023, 11:55 AM

## 2023-05-27 NOTE — Telephone Encounter (Signed)
   Name: Brandon Frank  DOB: 1955/07/22  MRN: [id number]  Primary Cardiologist: Gaylyn Keas, MD  Chart reviewed as part of pre-operative protocol coverage. Because of [name]'s past medical history and time since last visit, he will require a follow-up in-office visit in order to better assess preoperative cardiovascular risk.  Pre-op  covering staff: - Please schedule appointment and call patient to inform them. If patient already had an upcoming appointment within acceptable timeframe, please add "pre-op  clearance" to the appointment notes so provider is aware. - Please contact requesting surgeon's office via preferred method (i.e, phone, fax) to inform them of need for appointment prior to surgery.  This message will also be routed to pharmacy pool for input on holding Eliquis  as requested below so that this information is available to the clearing provider at time of patient's appointment.   Morey Ar, NP  05/27/2023, 4:00 PM

## 2023-05-28 ENCOUNTER — Encounter: Payer: Self-pay | Admitting: Cardiology

## 2023-05-28 ENCOUNTER — Ambulatory Visit: Attending: Cardiology | Admitting: Cardiology

## 2023-05-28 VITALS — BP 134/78 | HR 76 | Ht 70.0 in | Wt 215.0 lb

## 2023-05-28 DIAGNOSIS — Z01810 Encounter for preprocedural cardiovascular examination: Secondary | ICD-10-CM | POA: Diagnosis present

## 2023-05-28 DIAGNOSIS — I2510 Atherosclerotic heart disease of native coronary artery without angina pectoris: Secondary | ICD-10-CM

## 2023-05-28 DIAGNOSIS — E7800 Pure hypercholesterolemia, unspecified: Secondary | ICD-10-CM | POA: Diagnosis present

## 2023-05-28 DIAGNOSIS — I4892 Unspecified atrial flutter: Secondary | ICD-10-CM | POA: Diagnosis present

## 2023-05-28 DIAGNOSIS — I152 Hypertension secondary to endocrine disorders: Secondary | ICD-10-CM | POA: Insufficient documentation

## 2023-05-28 DIAGNOSIS — E1159 Type 2 diabetes mellitus with other circulatory complications: Secondary | ICD-10-CM | POA: Diagnosis present

## 2023-05-28 DIAGNOSIS — I4891 Unspecified atrial fibrillation: Secondary | ICD-10-CM

## 2023-05-28 NOTE — Telephone Encounter (Signed)
 S/w the pt and his wife ([employer] employee) and have scheduled the pt to be seen today with Katlyn West, NP @ 10:55 today. Pt and his wife thanked me for the help.

## 2023-05-28 NOTE — Patient Instructions (Signed)
 Medication Instructions:  NO CHANGES *If you need a refill on your cardiac medications before your next appointment, please call your pharmacy*  Lab Work: FASTING LIPID PANEL -NEXT WEEK If you have labs (blood work) drawn today and your tests are completely normal, you will receive your results only by: MyChart Message (if you have MyChart) OR A paper copy in the mail If you have any lab test that is abnormal or we need to change your treatment, we will call you to review the results.  Testing/Procedures: NO TESTING  Follow-Up: At [facility], you and your health needs are our priority.  As part of our continuing mission to provide you with exceptional heart care, our providers are all part of one team.  This team includes your primary Cardiologist (physician) and Advanced Practice Providers or APPs (Physician Assistants and Nurse Practitioners) who all work together to provide you with the care you need, when you need it.  Your next appointment:   1 year(s)  Provider:   Gaylyn Keas, MD

## 2023-05-28 NOTE — Progress Notes (Signed)
 Cardiology Office Note    Date:  05/28/2023  ID:  Brandon Frank, DOB 25-Sep-1955, MRN [id number] PCP:  Audria Leather, MD  Cardiologist:  Gaylyn Keas, MD  Electrophysiologist:  None   Chief Complaint: Preoperative cardiac evaluation   History of Present Illness: .    Brandon Frank is a 68 y.o. male with visit-pertinent history of hypertension, CAD, sinus tachycardia, PACs, PVCs, OSA, prostate cancer, hyperlipidemia, paroxysmal A-fib.  Patient has been followed by Dr. Micael Adas since 2017.  He was initially seen for sinus tachycardia and also a history of severe sleep apnea treated with CPAP.  A cardiac event monitor was ordered that showed sinus rhythm with sinus tach, occasional PVCs, bigeminal PVCs, occasional PACs, second-degree type I AV block for 1 beat during sleep.  2D echo was also completed and showed EF 60 to 65%, grade 2 DD.  Repeat 2D echo completed in 07/2017 with EF of 60 to 65%, no RWMA.  He was incidentally found to be in atrial flutter by his PCP with heart rate in the 130s.  He started Eliquis  and was noted to have breakthrough A-fib.  In 2022 he had a repeat Myoview  stress test that revealed low risk with no evidence of ischemia or previous infarct.  Patient was seen in follow-up on 09/26/2021, he had no cardiac complaints but had previously been in [place] and contracted food poisoning.  He reported dizziness upon standing and his Toprol  was decreased to 50 mg daily with as needed 50 mg.  Patient was last in clinic on 03/18/2022.  Patient reports that he been stable from a cardiac standpoint.  Patient denied any reports of tachycardia or atrial fibrillation.  Today patient presents regarding preoperative cardiac evaluation.  Patient reports that he has been doing very well.  Patient denies any cardiac concerns or complaints today.  He denies any chest pain, shortness of breath, lower extremity Dema, orthopnea or PND.  Patient denies any palpitations or feelings of atrial  fibrillation.  He denies any presyncope or syncope.  Patient does remain active and is able to achieve greater than 4 METS of activity.  ROS: .   Today he denies chest pain, shortness of breath, lower extremity edema, fatigue, palpitations, melena, hematuria, hemoptysis, diaphoresis, weakness, presyncope, syncope, orthopnea, and PND.  All other systems are reviewed and otherwise negative. Studies Reviewed: Aaron Aas    EKG:  EKG is ordered today, personally reviewed, demonstrating  EKG Interpretation Date/Time:  Friday May 28 2023 11:12:35 EDT Ventricular Rate:  70 PR Interval:  154 QRS Duration:  80 QT Interval:  366 QTC Calculation: 395 R Axis:   -4  Text Interpretation: Normal sinus rhythm Minimal voltage criteria for LVH, may be normal variant ( R in aVL ) No significant change since last tracing Confirmed by Reiley Keisler [phone]) on 05/28/2023 12:57:36 PM   CV Studies: Cardiac studies reviewed are outlined and summarized above. Otherwise please see EMR for full report. Cardiac Studies & Procedures   ______________________________________________________________________________________________   STRESS TESTS  MYOCARDIAL PERFUSION IMAGING 07/16/2020  Narrative  Nuclear stress EF: 59%. The left ventricular ejection fraction is normal (55-65%).  This is a low risk study. There is no evidence of ischemia and no evidence of previous infarction.  The study is normal.   ECHOCARDIOGRAM  ECHOCARDIOGRAM COMPLETE 07/26/2017  Narrative *[facility]* [address] [place], [place] [address] [phone]  ------------------------------------------------------------------- Transthoracic Echocardiography  Patient:    Brandon, Frank MR #:  [id number] Study Date: 07/26/2017 Gender:     M Age:        62 Height:     177.8 cm Weight:     93.9 kg BSA:        2.18 m^2 Pt. Status: Room:  ATTENDING    Gaylyn Keas, MD REFERRING    Marquetta Sit SONOGRAPHER  Mylinda Asa ORDERING     Dunn, Dayna N REFERRING    Dunn, Dayna N PERFORMING   [facility], Outpatient  cc:  ------------------------------------------------------------------- LV EF: 60% -   65%  ------------------------------------------------------------------- Indications:      I48.92 Atrial flutter.  ------------------------------------------------------------------- History:   PMH:  Sinus tachycardia. PACs. PVCs. Severe obstructive sleep apnea. Chronic low back pain.  Risk factors:  Family history of coronary artery disease. Hypertension. Diabetes mellitus. Dyslipidemia.  ------------------------------------------------------------------- Study Conclusions  - Left ventricle: The cavity size was normal. There was severe focal basal hypertrophy. Systolic function was normal. The estimated ejection fraction was in the range of 60% to 65%. Wall motion was normal; there were no regional wall motion abnormalities. Left ventricular diastolic function parameters were normal. - Pulmonary arteries: Systolic pressure could not be accurately estimated.  ------------------------------------------------------------------- Study data:  Comparison was made to the study of 11/01/2013.  Study status:  Routine.  Procedure:  The patient reported no pain pre or post test. Transthoracic echocardiography. Image quality was adequate.  Study completion:  There were no complications. Transthoracic echocardiography.  M-mode, complete 2D, spectral Doppler, and color Doppler.  Birthdate:  Patient birthdate: May 26, 1955.  Age:  Patient is 68 yr old.  Sex:  Gender: male. BMI: 29.7 kg/m^2.  Blood pressure:     122/69  Patient status: Outpatient.  Study date:  Study date: 07/26/2017. Study time: 03:20 PM.  Location:  [facility]  -------------------------------------------------------------------  ------------------------------------------------------------------- Left ventricle:  The cavity size was  normal. There was severe focal basal hypertrophy. Systolic function was normal. The estimated ejection fraction was in the range of 60% to 65%. Wall motion was normal; there were no regional wall motion abnormalities. The transmitral flow pattern was normal. The deceleration time of the early transmitral flow velocity was normal. The pulmonary vein flow pattern was normal. The tissue Doppler parameters were normal. Left ventricular diastolic function parameters were normal.  ------------------------------------------------------------------- Aortic valve:   Trileaflet; normal thickness leaflets. Mobility was not restricted.  Doppler:  Transvalvular velocity was within the normal range. There was no stenosis. There was no regurgitation.  ------------------------------------------------------------------- Aorta:  Aortic root: The aortic root was normal in size.  ------------------------------------------------------------------- Mitral valve:   Structurally normal valve.   Mobility was not restricted.  Doppler:  Transvalvular velocity was within the normal range. There was no evidence for stenosis. There was no regurgitation.    Peak gradient (D): 3 mm Hg.  ------------------------------------------------------------------- Left atrium:  The atrium was normal in size.  ------------------------------------------------------------------- Right ventricle:  The cavity size was normal. Wall thickness was normal. Systolic function was normal.  ------------------------------------------------------------------- Pulmonic valve:    Structurally normal valve.   Cusp separation was normal.  Doppler:  Transvalvular velocity was within the normal range. There was no evidence for stenosis. There was no regurgitation.  ------------------------------------------------------------------- Tricuspid valve:   Structurally normal valve.    Doppler: Transvalvular velocity was within the normal range.  There was no regurgitation.  ------------------------------------------------------------------- Pulmonary artery:   The main pulmonary artery was normal-sized. Systolic pressure could not be accurately estimated.  ------------------------------------------------------------------- Right atrium:  The atrium was  normal in size.  ------------------------------------------------------------------- Pericardium:  There was no pericardial effusion.  ------------------------------------------------------------------- Systemic veins: Inferior vena cava: The vessel was normal in size.  ------------------------------------------------------------------- Measurements  Left ventricle                           Value        Reference LV ID, ED, PLAX chordal        (L)       39.7  mm     43 - 52 LV ID, ES, PLAX chordal                  26    mm     23 - 38 LV fx shortening, PLAX chordal           35    %      >=29 LV PW thickness, ED                      15    mm     --------- IVS/LV PW ratio, ED                      1.11         <=1.3 LV e&', lateral                           16.9  cm/s   --------- LV E/e&', lateral                         4.91         --------- LV e&', medial                            7.24  cm/s   --------- LV E/e&', medial                          11.45        --------- LV e&', average                           12.07 cm/s   --------- LV E/e&', average                         6.87         ---------  Ventricular septum                       Value        Reference IVS thickness, ED                        16.6  mm     ---------  LVOT                                     Value        Reference LVOT ID, S                               22    mm     --------- LVOT  area                                3.8   cm^2   ---------  Aorta                                    Value        Reference Aortic root ID, ED                       35    mm     ---------  Left atrium                               Value        Reference LA ID, A-P, ES                           36    mm     --------- LA ID/bsa, A-P                           1.65  cm/m^2 <=2.2 LA volume, S                             61    ml     --------- LA volume/bsa, S                         28    ml/m^2 --------- LA volume, ES, 1-p A4C                   54    ml     --------- LA volume/bsa, ES, 1-p A4C               24.8  ml/m^2 --------- LA volume, ES, 1-p A2C                   66    ml     --------- LA volume/bsa, ES, 1-p A2C               30.3  ml/m^2 ---------  Mitral valve                             Value        Reference Mitral E-wave peak velocity              82.9  cm/s   --------- Mitral A-wave peak velocity              95.8  cm/s   --------- Mitral deceleration time       (H)       292   ms     150 - 230 Mitral peak gradient, D                  3     mm Hg  --------- Mitral E/A ratio, peak                   0.9          ---------  Right  ventricle                          Value        Reference RV s&', lateral, S                        16.3  cm/s   ---------  Legend: (L)  and  (H)  mark values outside specified reference range.  ------------------------------------------------------------------- Prepared and Electronically Authenticated by  Gaylyn Keas, MD 2019-07-08T16:11:06      CT SCANS  CT CARDIAC SCORING (SELF PAY ONLY) 04/01/2016  Addendum 04/01/2016  3:25 PM ADDENDUM REPORT: 04/01/2016 15:22  CLINICAL DATA:  Risk stratification  EXAM: Coronary Calcium  Score  TECHNIQUE: The patient was scanned on a Siemens Somatom 64 slice scanner. Axial non-contrast 3 mm slices were carried out through the heart. The data set was analyzed on a dedicated work station and scored using the Agatson method.  FINDINGS: Non-cardiac: See separate report from [facility].  Ascending Aorta:  3.3 cm  Pericardium: Normal  Coronary arteries:  Dense 3 vessel coronary calcium   seen  IMPRESSION: Coronary calcium  score of 907 . This was 97th percentile for age and sex matched control. Given how high score is consider f/u perfusion study  Brandon Frank   Electronically Signed By: Brandon Frank M.D. On: 04/01/2016 15:22  Narrative EXAM: OVER-READ INTERPRETATION  CT CHEST  The following report is an over-read performed by radiologist Dr. Asenath Blacker [facility] on 04/01/2016. This over-read does not include interpretation of cardiac or coronary anatomy or pathology. The coronary calcium  score interpretation by the cardiologist is attached.  COMPARISON:  None.  FINDINGS: Cardiovascular: Heart is normal size. Visualized aorta normal caliber.  Mediastinum/Nodes: No adenopathy in the visualized lower mediastinum or hila.  Lungs/Pleura: Visualized lungs are clear.  Upper Abdomen: Imaging into the upper abdomen shows no acute findings.  Musculoskeletal: Visualized chest wall soft tissues unremarkable. Degenerative spurring anteriorly in the visualized thoracic spine. No acute bony abnormality.  IMPRESSION: No acute or significant extracardiac abnormality.  Electronically Signed: By: Janeece Mechanic M.D. On: 04/01/2016 13:40     ______________________________________________________________________________________________       Current Reported Medications:.    Current Meds  Medication Sig   acetaminophen  (TYLENOL ) 500 MG tablet Take 1,000 mg by mouth 2 (two) times daily as needed for moderate pain or headache.   apixaban  (ELIQUIS ) 5 MG TABS tablet Take 1 tablet by mouth twice daily   BAYER MICROLET LANCETS lancets USE   TO CHECK GLUCOSE TWICE DAILY   buprenorphine  (SUBUTEX ) 2 MG SUBL SL tablet 2 mg every 12 (twelve) hours.   buPROPion (WELLBUTRIN XL) 150 MG 24 hr tablet Take 150 mg by mouth daily.   calcium  carbonate (TUMS - DOSED IN MG ELEMENTAL CALCIUM ) 500 MG chewable tablet Chew 2 tablets by mouth daily as needed for  indigestion or heartburn.    clonazePAM (KLONOPIN) 0.5 MG tablet Take 0.5 mg by mouth at bedtime.   CONTOUR NEXT TEST test strip USE 1 STRIP TO CHECK GLUCOSE ONCE DAILY   Dulaglutide  1.5 MG/0.5ML SOPN Inject 3 mg into the skin every Friday.   DULoxetine  (CYMBALTA ) 60 MG capsule TAKE 2 CAPSULES BY MOUTH EVERY OTHER DAY ALTERNATING WITH 3 CAPSULES EVERY OTHER DAY   eszopiclone (LUNESTA) 2 MG TABS tablet Take 2 mg by mouth at bedtime. Take immediately before bedtime   ezetimibe  (ZETIA ) 10 MG tablet Take 1  tablet (10 mg total) by mouth daily.   glipiZIDE (GLUCOTROL) 10 MG tablet Take 10 mg by mouth daily before breakfast.   glucose blood test strip One touch verio.  Test once daily. Dx E11.9   insulin  glargine (LANTUS SOLOSTAR) 100 UNIT/ML Solostar Pen Inject 20 Units into the skin daily.   Lancets (ONETOUCH ULTRASOFT) lancets One touch verio lancets.  Test once daily.  Dx e11.9   LORazepam  (ATIVAN ) 1 MG tablet Take 1 tablet (1 mg total) by mouth 4 (four) times daily as needed for anxiety.   metFORMIN  (GLUCOPHAGE ) 500 MG tablet TAKE 2 TABLETS BY MOUTH TWICE DAILY WITH A MEAL. PLEASE SCHEDULE YOUR FOLLOW UP APPOINTMENT. [phone]   methocarbamol  (ROBAXIN ) 750 MG tablet Take 750 mg by mouth every 8 (eight) hours as needed for muscle spasms.   metoprolol  succinate (TOPROL -XL) 50 MG 24 hr tablet Take 1 tablet (50 mg total) by mouth daily. Pt must schedule an overdue followup appt with Cardiology for any more refills. [phone] 2nd attempt Thank You   metoprolol  tartrate (LOPRESSOR ) 25 MG tablet Take 25 mg by mouth as directed. As needed for palpitations   OZEMPIC, 2 MG/DOSE, 8 MG/3ML SOPN Inject 2 mg into the skin once a week.   Polyethyl Glycol-Propyl Glycol (SYSTANE OP) Place 1 drop into both eyes daily as needed (dryness).   rosuvastatin  (CRESTOR ) 40 MG tablet TAKE 1 TABLET EVERY DAY   tadalafil  (CIALIS ) 5 MG tablet Take 5 mg by mouth daily.   tamsulosin  (FLOMAX ) 0.4 MG CAPS capsule Take 0.4 mg  by mouth daily.   zaleplon  (SONATA ) 10 MG capsule Take 10 mg by mouth at bedtime as needed for sleep.   Physical Exam:    VS:  BP 134/78   Pulse 76   Ht 5\' 10"  (1.778 m)   Wt 215 lb (97.5 kg)   SpO2 94%   BMI 30.85 kg/m    Wt Readings from Last 3 Encounters:  05/28/23 215 lb (97.5 kg)  03/18/22 211 lb 12.8 oz (96.1 kg)  09/26/21 207 lb 3.2 oz (94 kg)    GEN: Well nourished, well developed in no acute distress NECK: No JVD; No carotid bruits CARDIAC: RRR, no murmurs, rubs, gallops RESPIRATORY:  Clear to auscultation without rales, wheezing or rhonchi  ABDOMEN: Soft, non-tender, non-distended EXTREMITIES:  No edema; No acute deformity     Asessement and Plan:.    Preoperative cardiac evaluation: Vitrectomy with possible endolaser on 06/17/2023 with [facility]. Mr. Dragos's perioperative risk of a major cardiac event is 0.9% according to the Revised Cardiac Risk Index (RCRI).  Therefore, he is at low risk for perioperative complications.   His functional capacity is good at 7.25 METs according to the Duke Activity Status Index (DASI). Recommendations: According to ACC/AHA guidelines, no further cardiovascular testing needed.  The patient may proceed to surgery at acceptable risk.  Antiplatelet and/or Anticoagulation Recommendations: Per office protocol, patient can hold Eliquis  for 3 days prior to procedure, resume Eliquis  as soon as safe to do from a bleeding standpoint.  Paroxysmal atrial fibrillation: EKG today indicates normal sinus rhythm at 70 bpm.  Patient denies any palpitations or recent feelings of atrial fibrillation.  Patient denies any increased bleeding on Eliquis .  Patient does report that he has been taking Eliquis  every other day, discussed need to take Eliquis  twice daily as prescribed, patient notes that he is aware of stroke risk.  He reports that he has now met his deductible, intends to take Eliquis  prescription as  prescribed. Continue Eliquis  5 mg twice daily and  metoprolol  succinate 50 mg daily. CHA2DS2-VASc Score = 4 [CHF History: 0, HTN History: 1, Diabetes History: 1, Stroke History: 0, Vascular Disease History: 1, Age Score: 1, Gender Score: 0].  Therefore, the patient's annual risk of stroke is 4.8 %.      Hypertension: Blood pressure today 146/81, on recheck was 134/78.  Encouraged patient to continue monitoring his blood pressure at home and to notify the office if consistently elevated above 130/80.  Continue metoprolol  succinate 50 mg daily.  Coronary artery disease: Patient with coronary calcium  score of 907 in 2018, this was 97 percentile for age and sex matched control.  MPI in 06/2020 read as normal and low risk.  Stable with no anginal symptoms. No indication for ischemic evaluation.  Heart healthy diet and regular cardiovascular exercise encouraged.  Patient not on aspirin given need for Eliquis .  Continue Zetia  10 mg daily, metoprolol  succinate 50 mg daily and Crestor  40 mg daily.  Hyperlipidemia: Patient without recent lipid profile available for review.  Check fasting lipid profile.  LFTs on 03/31/2023 indicated AST 35 and ALT 35.  Continue rosuvastatin  40 mg daily and Zetia  10 mg daily pending lab work.   Disposition: F/u with Dr. Micael Adas in one year or sooner if needed.   Signed, Jeanice Dempsey D Jayel Inks, NP

## 2023-05-28 NOTE — Telephone Encounter (Signed)
 Patient with diagnosis of afib on Eliquis  for anticoagulation.    Procedure: VITRECTOMY WITH POSSIBLE ENDOLASER Date of procedure: 06/17/2023   CHA2DS2-VASc Score = 4   This indicates a 4.8% annual risk of stroke. The patient's score is based upon: CHF History: 0 HTN History: 1 Diabetes History: 1 Stroke History: 0 Vascular Disease History: 1 Age Score: 1 Gender Score: 0    CrCl 95 mL/min Platelet count 176 K  Patient has not  had an Afib/aflutter ablation within the last 3 months or DCCV within the last 30 days   7 days prior procedure old would be too long. Per office protocol, patient can hold Eliquis  for 3 days prior to procedure resume Eliquis  as advised by MD   **This guidance is not considered finalized until pre-operative APP has relayed final recommendations.**

## 2023-06-03 LAB — LIPID PANEL
Chol/HDL Ratio: 3.4 ratio (ref 0.0–5.0)
Cholesterol, Total: 122 mg/dL (ref 100–199)
HDL: 36 mg/dL — ABNORMAL LOW (ref >39)
LDL Chol Calc (NIH): 56 mg/dL (ref 0–99)
Triglycerides: 178 mg/dL — ABNORMAL HIGH (ref 0–149)
VLDL Cholesterol Cal: 30 mg/dL (ref 5–40)

## 2023-06-04 ENCOUNTER — Ambulatory Visit: Payer: Self-pay | Admitting: Emergency Medicine

## 2023-07-26 ENCOUNTER — Other Ambulatory Visit: Payer: Self-pay

## 2023-07-26 MED ORDER — EZETIMIBE 10 MG PO TABS: 10.0000 mg | ORAL_TABLET | Freq: Every day | ORAL | 3 refills | Status: AC

## 2023-07-26 MED ORDER — ROSUVASTATIN CALCIUM 40 MG PO TABS
40.0000 mg | ORAL_TABLET | Freq: Every day | ORAL | 3 refills | Status: AC
Start: 2023-07-26 — End: ?

## 2023-07-26 MED ORDER — METOPROLOL SUCCINATE ER 50 MG PO TB24: 50.0000 mg | ORAL_TABLET | Freq: Every day | ORAL | 3 refills | Status: AC

## 2023-11-22 ENCOUNTER — Other Ambulatory Visit: Payer: Self-pay | Admitting: Nurse Practitioner

## 2023-11-22 DIAGNOSIS — I4891 Unspecified atrial fibrillation: Secondary | ICD-10-CM

## 2023-11-22 NOTE — Telephone Encounter (Signed)
 Prescription refill request for Eliquis  received. Indication:afib Last office visit:5/25 Scr:0.89  1/25 Age: 68 Weight:97.5  kg  Prescription refilled

## 2024-01-24 ENCOUNTER — Encounter: Payer: Self-pay | Admitting: Cardiology

## 2024-01-25 MED ORDER — METOPROLOL TARTRATE 25 MG PO TABS: 25.0000 mg | ORAL_TABLET | ORAL | 0 refills | Status: AC
